# Patient Record
Sex: Female | Born: 1960 | Race: Black or African American | Hispanic: No | Marital: Married | Smoking: Never smoker
Health system: Southern US, Community
[De-identification: ages and names within clinical notes are randomized; demographics above are authoritative.]

## PROBLEM LIST (undated history)

## (undated) ENCOUNTER — Emergency Department (HOSPITAL_BASED_OUTPATIENT_CLINIC_OR_DEPARTMENT_OTHER): Admission: EM | Discharge status: Absent without leave | Payer: 59

## (undated) DIAGNOSIS — I10 Essential (primary) hypertension: Secondary | ICD-10-CM

## (undated) DIAGNOSIS — Z972 Presence of dental prosthetic device (complete) (partial): Secondary | ICD-10-CM

## (undated) DIAGNOSIS — K219 Gastro-esophageal reflux disease without esophagitis: Secondary | ICD-10-CM

## (undated) DIAGNOSIS — Z87898 Personal history of other specified conditions: Secondary | ICD-10-CM

## (undated) DIAGNOSIS — Z923 Personal history of irradiation: Secondary | ICD-10-CM

## (undated) DIAGNOSIS — T8859XA Other complications of anesthesia, initial encounter: Secondary | ICD-10-CM

## (undated) DIAGNOSIS — T4145XA Adverse effect of unspecified anesthetic, initial encounter: Secondary | ICD-10-CM

## (undated) DIAGNOSIS — M1990 Unspecified osteoarthritis, unspecified site: Secondary | ICD-10-CM

## (undated) DIAGNOSIS — F410 Panic disorder [episodic paroxysmal anxiety] without agoraphobia: Secondary | ICD-10-CM

## (undated) DIAGNOSIS — I341 Nonrheumatic mitral (valve) prolapse: Secondary | ICD-10-CM

## (undated) DIAGNOSIS — C50911 Malignant neoplasm of unspecified site of right female breast: Secondary | ICD-10-CM

## (undated) DIAGNOSIS — C50919 Malignant neoplasm of unspecified site of unspecified female breast: Secondary | ICD-10-CM

## (undated) HISTORY — PX: TUBAL LIGATION: SHX77

## (undated) HISTORY — PX: REDUCTION MAMMAPLASTY: SUR839

## (undated) HISTORY — DX: Gastro-esophageal reflux disease without esophagitis: K21.9

## (undated) HISTORY — DX: Essential (primary) hypertension: I10

---

## 1998-08-03 ENCOUNTER — Ambulatory Visit (HOSPITAL_COMMUNITY): Admission: RE | Admit: 1998-08-03 | Discharge: 1998-08-03 | Payer: Self-pay | Admitting: Gastroenterology

## 2008-05-30 ENCOUNTER — Ambulatory Visit (HOSPITAL_COMMUNITY): Admission: RE | Admit: 2008-05-30 | Discharge: 2008-05-30 | Payer: Self-pay | Admitting: Obstetrics and Gynecology

## 2008-05-30 ENCOUNTER — Encounter (INDEPENDENT_AMBULATORY_CARE_PROVIDER_SITE_OTHER): Payer: Self-pay | Admitting: Obstetrics and Gynecology

## 2008-05-30 HISTORY — PX: DILATION AND CURETTAGE OF UTERUS: SHX78

## 2008-05-30 HISTORY — PX: ENDOMETRIAL ABLATION: SHX621

## 2010-10-26 NOTE — Op Note (Signed)
NAME:  Felicia Bauer, Felicia Bauer NO.:  [id number]   MEDICAL RECORD NO.:  [id number]          PATIENT TYPE:  AMB   LOCATION:  SDC                           FACILITY:  [facility]   PHYSICIAN:  Miguel Aschoff, M.D.       DATE OF BIRTH:  1961-01-15   DATE OF PROCEDURE:  05/30/2008  DATE OF DISCHARGE:                               OPERATIVE REPORT   PREOPERATIVE DIAGNOSES:  Menorrhagia, uterine fibroids.   POSTOPERATIVE DIAGNOSES:  Menorrhagia, uterine fibroids.   PROCEDURES:  Cervical dilatation, hysteroscopy, uterine curettage  followed by ThermaChoice endometrial ablation.   SURGEON:  Miguel Aschoff, MD   ANESTHESIA:  General.   COMPLICATIONS:  None.   JUSTIFICATION:  The patient is a 50 year old black female with history  of progressively heavy menses with menorrhagia.  The patient desires the  procedure to control these heavy cycles and was offered Mirena IUD,  endometrial ablation, and possible hysterectomy.  The risks and benefits  of one of these options were discussed with the patient and she presents  now to undergo endometrial ablation.  Informed consent has been  obtained.   PROCEDURE:  The patient was taken to the operating room, placed in the  supine position.  General anesthesia was administered without  difficulty.  She was then placed in dorsal lithotomy position, prepped  and draped in the usual sterile fashion.  Once this was done, the  bladder was catheterized, then examination under anesthesia revealed  normal external genitalia, normal Bartholin and Skene's glands, normal  urethra.  The vaginal vault was without gross lesion.  The cervix was  without gross lesion.  Uterus was noted to be irregular in shape,  approximately 10 weeks' equivalent size.  No adnexal masses were noted.  The _Graves_________ speculum was placed in the vaginal vault.  The  anterior cervical lip was grasped with the tenaculum and then the  endocervical canal was dilated using serial Pratt  dilators until a #23  Pratt dilator could be passed.  Once this was done, the diagnostic  hysteroscope was advanced through the endocervical canal.  No  endocervical lesions were noted.  The endometrial cavity was visualized  and appeared to be normal.  No polyps or submucous myomas were noted.  The cavity did sound to 11 cm.  After this was done, sharp vigorous  curettage was carried out.  The tissue obtained was then sent for  histologic study.  At this point, the ThermaChoice endometrial ablation  balloon was introduced to the uterine cavity and a treatment cycle at 88  degrees for 8 minutes was carried out without difficulty.  On the  completion of the treatment cycle, the ThermaChoice unit was removed  intact.  Hemostasis was brought under control.  There was one area of  bleeding secondary to tenaculum grasping the cervix, and this was  brought it under control by a single figure-of-eight suture of 2-0  Vicryl.  Estimated blood loss from the procedure was approximately 30  cc.  The patient tolerated the procedure well and went to the recovery  room in satisfactory condition.  Plan is for  the patient to be  discharged  home.  Medications for home include doxycycline 100 mg twice a day x3  days, Darvocet-N 100 one every 4 hours as needed for pain.  She was  instructed to place nothing per vagina for 2 weeks, to call for any  problems such as fever, pain, or heavy bleeding.  She will be seen back  in 4 weeks for her followup examination.      Miguel Aschoff, M.D.  Electronically Signed     AR/MEDQ  D:  05/30/2008  T:  05/31/2008  Job:  [id number]

## 2011-03-18 LAB — CBC
Hemoglobin: 13.2 g/dL (ref 12.0–15.0)
MCHC: 33.6 g/dL (ref 30.0–36.0)
RBC: 4.63 MIL/uL (ref 3.87–5.11)

## 2011-03-18 LAB — PROTIME-INR
INR: 0.9 (ref 0.00–1.49)
Prothrombin Time: 12.4 seconds (ref 11.6–15.2)

## 2011-03-18 LAB — BASIC METABOLIC PANEL
Calcium: 10 mg/dL (ref 8.4–10.5)
Creatinine, Ser: 0.67 mg/dL (ref 0.4–1.2)
GFR calc Af Amer: >60 mL/min (ref >60)
GFR calc non Af Amer: >60 mL/min (ref >60)
Sodium: 134 mEq/L — ABNORMAL LOW (ref 135–145)

## 2011-03-18 LAB — APTT: aPTT: 38 seconds — ABNORMAL HIGH (ref 24–37)

## 2011-11-10 ENCOUNTER — Other Ambulatory Visit: Payer: Self-pay | Admitting: Obstetrics and Gynecology

## 2012-11-16 ENCOUNTER — Other Ambulatory Visit: Payer: Self-pay | Admitting: Obstetrics and Gynecology

## 2013-03-22 ENCOUNTER — Encounter: Payer: Self-pay | Admitting: Neurology

## 2013-03-22 ENCOUNTER — Ambulatory Visit (INDEPENDENT_AMBULATORY_CARE_PROVIDER_SITE_OTHER): Payer: BC Managed Care – PPO | Admitting: Neurology

## 2013-03-22 ENCOUNTER — Encounter (INDEPENDENT_AMBULATORY_CARE_PROVIDER_SITE_OTHER): Payer: Self-pay

## 2013-03-22 VITALS — BP 115/72 | HR 57 | Temp 98.4°F | Ht 62.0 in | Wt 176.0 lb

## 2013-03-22 DIAGNOSIS — R202 Paresthesia of skin: Secondary | ICD-10-CM | POA: Insufficient documentation

## 2013-03-22 DIAGNOSIS — R209 Unspecified disturbances of skin sensation: Secondary | ICD-10-CM

## 2013-03-22 DIAGNOSIS — R29898 Other symptoms and signs involving the musculoskeletal system: Secondary | ICD-10-CM

## 2013-03-22 DIAGNOSIS — R6889 Other general symptoms and signs: Secondary | ICD-10-CM

## 2013-03-22 DIAGNOSIS — R200 Anesthesia of skin: Secondary | ICD-10-CM

## 2013-03-22 DIAGNOSIS — D509 Iron deficiency anemia, unspecified: Secondary | ICD-10-CM

## 2013-03-22 DIAGNOSIS — R29818 Other symptoms and signs involving the nervous system: Secondary | ICD-10-CM

## 2013-03-22 DIAGNOSIS — M6281 Muscle weakness (generalized): Secondary | ICD-10-CM

## 2013-03-22 NOTE — Patient Instructions (Addendum)
There is no specific treatment for most neuropathies. The most common cause for neuropathy is diabetes in this country, in which case, tight glucose control is key. Certain medications such as chemotherapy agents and other chemicals or toxins including alcohol can cause neuropathy. There are some genetic conditions or hereditary neuropathies. Typically patients will report a family history of neuropathy in those conditions. Most neuropathies are progressive unless they are caused by a toxin or heavy metal which has been removed or treated. For most neuropathies there is no cure. Painful neuropathy can be difficult to treat symptomatically. Electrophysiologic testing with nerve conduction velocity studies and EMG do not always pick up neuropathies that affect the smallest fibers.  I think overall you are doing fairly well but I do want to suggest a few things today:  Remember to drink plenty of fluid, eat healthy meals and do not skip any meals. Try to eat protein with a every meal and eat a healthy snack such as fruit or nuts in between meals. Try to keep a regular sleep-wake schedule and try to exercise daily, particularly in the form of walking, 20-30 minutes a day, if you can.   As far as your medications are concerned, I would like to suggest that you discuss increasing your gabapentin to 300 mg three times a day with your PCP.  As far as diagnostic testing: EMG and NCV testing, blood test, MRI neck.  I would like to see you back in 3 months, sooner if we need to. Please call us with any interim questions, concerns, problems, updates or refill requests.  Please also call us for any test results so we can go over those with you on the phone. Brett Canales is my clinical assistant and will answer any of your questions and relay your messages to me and also relay most of my messages to you.  Our phone number is [phone]. We also have an after hours call service for urgent matters and there is a physician  on-call for urgent questions. For any emergencies you know to call 911 or go to the nearest emergency room.

## 2013-03-22 NOTE — Progress Notes (Signed)
Subjective:    Patient ID: Felicia Bauer is a 52 y.o. female.  HPI  Huston Foley, MD, PhD [facility] [address] [address] [place], [place] [address]  Dear Dr. [place],  I saw your patient, Felicia Bauer, upon your kind request in my neurologic clinic today for initial consultation of her numbness and tingling. The patient is unaccompanied today. As you know, Felicia Bauer is a very pleasant 52 year old right-handed woman with an underlying medical history of hypertension, reflux disease, allergic rhinitis, anemia, and panic disorder, who has been experiencing numbness and tingling in her upper and lower extremities for the past months. She describes burning pain as well, no clear back pain or neck pain, but Sx are worse with bearing down on the toilet and when bending down. She has been tried on gabapentin without much in the way of success. She had lab work done in your office which showed normal vitamin B12 which was on the lower end of the spectrum at 289, normal TSH, normal SPEP. This was done on 03/01/2013 and I reviewed those test results. She started having Depo Lupron injections in 6/14 and has received 3 injections thus far. She is due for her 4th injection next week. She has had onset of Symptoms over 1 month ago, she is not diabetic, has no FHx of neuropathy, has not changed her meds or supplements with the exception of starting B12 last month. She has had no prodromal illness, is a non-smoker and non-drinker. She has had no exposure to chemicals or herbicides or pesticides. She is taking gabapentin 300 mg, which has helped.   Her Past Medical History Is Significant For: Past Medical History  Diagnosis Date  . Essential hypertension, benign   . Chest pain, unspecified   . Shortness of breath   . Insomnia, unspecified   . Iron deficiency anemia secondary to blood loss (chronic)   . Mitral valve disorders   . Obesity, unspecified   . Sleep  disturbance, unspecified   . Panic disorder   . GERD (gastroesophageal reflux disease)   . Disturbance of skin sensation     Her Past Surgical History Is Significant For: History reviewed. No pertinent past surgical history.  Her Family History Is Significant For: History reviewed. No pertinent family history.  Her Social History Is Significant For: History   Social History  . Marital Status: Legally Separated    Spouse Name: N/A    Number of Children: N/A  . Years of Education: N/A   Social History Main Topics  . Smoking status: Never Smoker   . Smokeless tobacco: None  . Alcohol Use: No  . Drug Use: No  . Sexual Activity: None   Other Topics Concern  . None   Social History Narrative  . None    Her Allergies Are:  No Known Allergies:   Her Current Medications Are:  No outpatient encounter prescriptions on file as of 03/22/2013.   No facility-administered encounter medications on file as of 03/22/2013.   Review of Systems:  Out of a complete 14 point review of systems, all are reviewed and negative with the exception of these symptoms as listed below:   Review of Systems  Neurological: Positive for weakness and numbness.    Objective:  Neurologic Exam  Physical Exam Physical Examination:   Filed Vitals:   03/22/13 0959  BP: 115/72  Pulse: 57  Temp: 98.4 F (36.9 C)    General Examination: The patient is  a very pleasant 52 y.o. female in no acute distress. She appears well-developed and well-nourished and well groomed.   HEENT: Normocephalic, atraumatic, pupils are equal, round and reactive to light and accommodation. Funduscopic exam is normal with sharp disc margins noted. Extraocular tracking is good without limitation to gaze excursion or nystagmus noted. Normal smooth pursuit is noted. Hearing is grossly intact. Tympanic membranes are clear bilaterally. Face is symmetric with normal facial animation and normal facial sensation. Speech is clear  with no dysarthria noted. There is no hypophonia. There is no lip, neck/head, jaw or voice tremor. Neck is supple with full range of passive and active motion. There are no carotid bruits on auscultation. Oropharynx exam reveals: mild mouth dryness, adequate dental hygiene and mild airway crowding. Mallampati is class I. Tongue protrudes centrally and palate elevates symmetrically. Tonsils are 2+ in size.   Chest: Clear to auscultation without wheezing, rhonchi or crackles noted.  Heart: S1+S2+0, regular and normal without murmurs, rubs or gallops noted.   Abdomen: Soft, non-tender and non-distended with normal bowel sounds appreciated on auscultation.  Extremities: There is no pitting edema in the distal lower extremities bilaterally. Pedal pulses are intact.  Skin: Warm and dry without trophic changes noted. There are no varicose veins.  Musculoskeletal: exam reveals no obvious joint deformities, tenderness or joint swelling or erythema.   Neurologically:  Mental status: The patient is awake, alert and oriented in all 4 spheres. Her memory, attention, language and knowledge are appropriate. There is no aphasia, agnosia, apraxia or anomia. Speech is clear with normal prosody and enunciation. Thought process is linear. Mood is congruent and affect is normal.  Cranial nerves are as described above under HEENT exam. In addition, shoulder shrug is normal with equal shoulder height noted. Motor exam: Normal bulk, strength and tone is noted with the exception of mild grip strength weakness in the L hand. There is no drift, tremor or rebound. Romberg is negative. Reflexes are 2+ throughout. Toes are downgoing bilaterally. Fine motor skills are intact with normal finger taps, normal hand movements, normal rapid alternating patting, normal foot taps and normal foot agility.  Cerebellar testing shows no dysmetria or intention tremor on finger to nose testing. Heel to shin is unremarkable bilaterally. There  is no truncal or gait ataxia.  Sensory exam is intact to light touch, pinprick, vibration, temperature sense in the upper and lower extremities, but she reports tingling and burning pain in both hands and feet.  Gait, station and balance are unremarkable. No veering to one side is noted. No leaning to one side is noted. Posture is age-appropriate and stance is narrow based. No problems turning are noted. She turns en bloc. Tandem walk is unremarkable. Intact toe and heel stance is noted.               Assessment and Plan:   In summary, MAEBEL MARASCO is a very pleasant 52 y.o.-year old female with a history of paresthesias and numbness in the past 1-2 months. Her physical exam is c/w paresthesias and otherwise non-focal and she describes a phenomenon similar to Lhermitte's. She has no clear numbness on exam and mild L grip weakness. She had some improvement with low dose Gabapentin. You can increase this gradually to 300 mg tid at this time.  She has had anemia from menorrhagia. She has been on Lupron injections and I cannot exclude, that her Sx are from this medication and paresthesias and weakness can be rare SEs.  I had  a long chat with the patient and her mother about my findings and her Sx, the prognosis and treatment options. We talked about medical treatments and non-pharmacological approaches. I suggested further w/u in the form of EMG/NCV, C spine MRI, and blood work.  I answered all their questions today and the patient and her mother were in agreement with the above outlined plan. I would like to see the patient back in 3 months, sooner if the need arises and encouraged them to call with any interim questions, concerns, problems or updates and test results.   Thank you very much for allowing me to participate in the care of this nice patient. If I can be of any further assistance to you please do not hesitate to call me at [phone].  Sincerely,   Huston Foley, MD, PhD

## 2013-03-26 LAB — VITAMIN B6: Vitamin B6: 9.1 ug/L (ref 2.0–32.8)

## 2013-03-26 LAB — HEAVY METALS PROFILE II, BLOOD
Arsenic: 5 ug/L (ref 2–23)
Cadmium: NOT DETECTED ug/L (ref 0.0–1.2)
Mercury: 1.2 ug/L (ref 0.0–14.9)

## 2013-03-26 LAB — CK TOTAL AND CKMB (NOT AT ARMC)
CK-MB Index: 1.6 ng/mL (ref 0.0–2.9)
Total CK: 95 U/L (ref 24–173)

## 2013-03-26 LAB — RPR: RPR: NONREACTIVE

## 2013-03-26 NOTE — Progress Notes (Signed)
Quick Note:  Please call and advise the patient that the recent labs we checked were within normal limits. We checked heavy metals, muscle enzymes, vitamin B6, inflammatory, autoimmune and infectious markers, all of which were reported as normal. No further action is required on these tests at this time. Please remind patient to keep any upcoming appointments or tests and to call us with any interim questions, concerns, problems or updates. Thanks,  Huston Foley, MD, PhD    ______

## 2013-03-27 ENCOUNTER — Telehealth: Payer: Self-pay | Admitting: Neurology

## 2013-03-28 NOTE — Telephone Encounter (Signed)
Called patient and shared  Dr Teofilo Pod findings as noted below. Please call and advise the patient that the recent labs we checked were within normal limits. We checked heavy metals, muscle enzymes, vitamin B6, inflammatory, autoimmune and infectious markers, all of which were reported as normal. No further action is required on these tests at this time. Please remind patient to keep any upcoming appointments or tests and to call us with any interim questions, concerns, problems or updates. Thanks,  Huston Foley, MD, PhD

## 2013-04-09 ENCOUNTER — Ambulatory Visit (INDEPENDENT_AMBULATORY_CARE_PROVIDER_SITE_OTHER): Payer: BC Managed Care – PPO | Admitting: Neurology

## 2013-04-09 ENCOUNTER — Encounter (INDEPENDENT_AMBULATORY_CARE_PROVIDER_SITE_OTHER): Payer: BC Managed Care – PPO | Admitting: Radiology

## 2013-04-09 DIAGNOSIS — D509 Iron deficiency anemia, unspecified: Secondary | ICD-10-CM

## 2013-04-09 DIAGNOSIS — Z0289 Encounter for other administrative examinations: Secondary | ICD-10-CM

## 2013-04-09 DIAGNOSIS — R29898 Other symptoms and signs involving the musculoskeletal system: Secondary | ICD-10-CM

## 2013-04-09 DIAGNOSIS — R200 Anesthesia of skin: Secondary | ICD-10-CM

## 2013-04-09 DIAGNOSIS — R202 Paresthesia of skin: Secondary | ICD-10-CM

## 2013-04-09 DIAGNOSIS — R209 Unspecified disturbances of skin sensation: Secondary | ICD-10-CM

## 2013-04-09 DIAGNOSIS — R29818 Other symptoms and signs involving the nervous system: Secondary | ICD-10-CM

## 2013-04-09 NOTE — Progress Notes (Signed)
Quick Note:  Shared normal NCV EMG findings per Dr Frances Furbish notes below, lt VM message. ______

## 2013-04-09 NOTE — Procedures (Signed)
    [facility]  NCS (NERVE CONDUCTION STUDY) WITH EMG (ELECTROMYOGRAPHY) REPORT   STUDY DATE: 04/09/2013 PATIENT NAME: Felicia Bauer DOB: 04/07/1961 MRN: [id number]    TECHNOLOGIST: Kaylyn Lim ELECTROMYOGRAPHER: Levert Feinstein M.D.  CLINICAL INFORMATION: 52 years old right-handed [place] American female, presenting with two-month history of left-sided numbness, involving left arm, left leg, she denies significant weakness.  FINDINGS: NERVE CONDUCTION STUDY: Left median, ulnar sensory, mixed, and motor responses were normal. Left peroneal to EDB, and tibial motor responses were normal  NEEDLE ELECTROMYOGRAPHY: Selected needle examination was performed at left upper, left lower extremity muscles, left cervical, left lumbar paraspinal muscles.  Needle examination of left biceps, triceps, pronator teres, extensor digitorum communis, deltoid was normal.  There was no spontaneous activity at left cervical paraspinal muscles, left C5, C6, C7  Needle examination of left tibialis anterior, tibialis posterior, medial gastrocnemius, vastus lateralis was normal.  There was no spontaneous activity at left L4, L5, S1.   IMPRESSION:  This is a normal study. There is no electrodiagnostic evidence of large fiber neuropathy, myopathy, left cervical radiculopathy, or left lumbar radiculopathy.   INTERPRETING PHYSICIAN:   Levert Feinstein M.D. Ph.D. [facility] [address] [place], [place] [address] [phone]

## 2013-04-09 NOTE — Progress Notes (Signed)
Quick Note:  Please call and advise the patient that the recent EMG and nerve conduction velocity test, which is the electrical nerve and muscle test we we performed, was reported as within normal limits. We checked for abnormal electrical discharges in the muscles or nerves and the report suggested normal findings. No further action is required on this test at this time. Please remind patient to keep any upcoming appointments or tests and to call us with any interim questions, concerns, problems or updates. Thanks,  Rajanee Schuelke, MD, PhD   ______ 

## 2013-04-10 ENCOUNTER — Ambulatory Visit (INDEPENDENT_AMBULATORY_CARE_PROVIDER_SITE_OTHER): Payer: BC Managed Care – PPO

## 2013-04-10 DIAGNOSIS — R209 Unspecified disturbances of skin sensation: Secondary | ICD-10-CM

## 2013-04-10 DIAGNOSIS — R29818 Other symptoms and signs involving the nervous system: Secondary | ICD-10-CM

## 2013-04-10 DIAGNOSIS — R6889 Other general symptoms and signs: Secondary | ICD-10-CM

## 2013-04-10 DIAGNOSIS — M6281 Muscle weakness (generalized): Secondary | ICD-10-CM

## 2013-04-10 DIAGNOSIS — R29898 Other symptoms and signs involving the musculoskeletal system: Secondary | ICD-10-CM

## 2013-04-10 DIAGNOSIS — R200 Anesthesia of skin: Secondary | ICD-10-CM

## 2013-04-10 DIAGNOSIS — D509 Iron deficiency anemia, unspecified: Secondary | ICD-10-CM

## 2013-04-11 ENCOUNTER — Telehealth: Payer: Self-pay | Admitting: Neurology

## 2013-04-11 DIAGNOSIS — M5030 Other cervical disc degeneration, unspecified cervical region: Secondary | ICD-10-CM

## 2013-04-11 DIAGNOSIS — R29818 Other symptoms and signs involving the nervous system: Secondary | ICD-10-CM

## 2013-04-11 DIAGNOSIS — M4802 Spinal stenosis, cervical region: Secondary | ICD-10-CM

## 2013-04-11 DIAGNOSIS — G9520 Unspecified cord compression: Secondary | ICD-10-CM

## 2013-04-11 NOTE — Telephone Encounter (Signed)
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 I told her about the most sinister finding at C5-6 but also above and below that level. I suggested that she seek consultation with a neurosurgeon. She is reluctant to pursue this and would like to get an opinion from her primary care physician regarding where to go for this. Nevertheless, after our conversation she agreed to be referred to neurosurgery and she may call back with a particular group or Dr. she might want to see based on what her PCP suggests.

## 2013-04-18 ENCOUNTER — Other Ambulatory Visit: Payer: Self-pay

## 2013-04-19 ENCOUNTER — Other Ambulatory Visit: Payer: Self-pay | Admitting: Neurosurgery

## 2013-04-26 ENCOUNTER — Encounter: Payer: Self-pay | Admitting: Neurology

## 2013-05-15 ENCOUNTER — Other Ambulatory Visit (HOSPITAL_COMMUNITY): Payer: Self-pay | Admitting: *Deleted

## 2013-05-15 NOTE — Pre-Procedure Instructions (Signed)
SHERALD BALBUENA  05/15/2013   Your procedure is scheduled on:  Friday, May 24, 2013 at 7:30 AM.   Report to [facility] Entrance "A" at 5:30 AM.   Call this number if you have problems the morning of surgery: [phone]   Remember:   Do not eat food or drink liquids after midnight Thursday, 05/23/13.   Take these medicines the morning of surgery with A SIP OF WATER: Dexlansoprazole (DEXILANT PO), nadolol (CORGARD), clonazePAM (KLONOPIN) - if needed, and Norethindrone.  Stop all Vitamins and Aspirin as of Friday, 05/17/13.                Do not wear jewelry, make-up or nail polish.  Do not wear lotions, powders, or perfumes. You may wear deodorant.  Do not shave 48 hours prior to surgery.  Do not bring valuables to the hospital.  [facility] is not responsible  for any belongings or valuables.               Contacts, dentures or bridgework may not be worn into surgery.  Leave suitcase in the car. After surgery it may be brought to your room.  For patients admitted to the hospital, discharge time is determined by your  treatment team.            Special Instructions: Shower using CHG 2 nights before surgery and the night before surgery.  If you shower the day of surgery use CHG.  Use special wash - you have one bottle of CHG for all showers.  You should use approximately 1/3 of the bottle for each shower.   Please read over the following fact sheets that you were given: Pain Booklet, Coughing and Deep Breathing, MRSA Information and Surgical Site Infection Prevention

## 2013-05-16 ENCOUNTER — Encounter (HOSPITAL_COMMUNITY)
Admission: RE | Admit: 2013-05-16 | Discharge: 2013-05-16 | Disposition: A | Discharge status: Home / Self Care | Payer: BC Managed Care – PPO | Source: Ambulatory Visit | Attending: Neurosurgery | Admitting: Neurosurgery

## 2013-05-16 ENCOUNTER — Encounter (HOSPITAL_COMMUNITY): Payer: Self-pay

## 2013-05-16 DIAGNOSIS — Z01810 Encounter for preprocedural cardiovascular examination: Secondary | ICD-10-CM | POA: Insufficient documentation

## 2013-05-16 DIAGNOSIS — Z01818 Encounter for other preprocedural examination: Secondary | ICD-10-CM | POA: Insufficient documentation

## 2013-05-16 DIAGNOSIS — Z01812 Encounter for preprocedural laboratory examination: Secondary | ICD-10-CM | POA: Insufficient documentation

## 2013-05-16 LAB — BASIC METABOLIC PANEL
BUN: 14 mg/dL (ref 6–23)
CO2: 29 mEq/L (ref 19–32)
Calcium: 9.4 mg/dL (ref 8.4–10.5)
Chloride: 98 mEq/L (ref 96–112)
Potassium: 3.9 mEq/L (ref 3.5–5.1)
Sodium: 134 mEq/L — ABNORMAL LOW (ref 135–145)

## 2013-05-16 LAB — CBC
HCT: 32.4 % — ABNORMAL LOW (ref 36.0–46.0)
MCH: 20.6 pg — ABNORMAL LOW (ref 26.0–34.0)
MCHC: 30.6 g/dL (ref 30.0–36.0)
MCV: 67.4 fL — ABNORMAL LOW (ref 78.0–100.0)
Platelets: 263 10*3/uL (ref 150–400)
RBC: 4.81 MIL/uL (ref 3.87–5.11)
WBC: 7.2 10*3/uL (ref 4.0–10.5)

## 2013-05-17 NOTE — Progress Notes (Addendum)
Anesthesia Chart Review:  Patient is a 52 year old female scheduled for C4-5, C5-6, C6-7 ACDF on 05/24/13 by Dr. Gerlene Fee.  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  She has a history of cardiology evaluation for chest pain by Dr. Donato Schultz. PCP is listed as Dr. Marinda Elk.    EKG on 05/16/13 showed SB @ 54 bpm.  CXR on 05/16/13 showed no active cardiopulmonary disease.  Preoperative labs noted.  Cr 0.70, glucose 107.  H/H 9.9/32.4.    Cardiology records from Dr. Anne Fu requested as well as last office note with labs from Dr. Foy Guadalajara.  I'll follow-up once records received.  Velna Ochs [facility] Stay [facility]/Anesthesiology Phone [phone] 05/17/2013 5:06 PM  Addendum: 05/20/2013 1:30 PM Reviewed her last cardiology records with Dr. Anne Fu.  He last saw her in 04/2012 and recommended only PRN cardiology follow-up.  He did not recommend need for dental prophylaxis.  Echo on 09/11/2008 showed normal LV size and function, LVEF 55-60%, mild anterior MVP, trace MR, trivial TR, trace PR, mild diastolic dysfunction.  Nuclear stress test on 09/11/08 showed normal pattern of perfusion in all regions, no evidence of inducible ischemia, no regional wall motion abnormalities, post-stress EF 74%, low risk scan.  CBC labs trends from 11/24/10 - 01/03/13 reviewed (Dr. Foy Gu[place]ara). Her H/H were 8.3/26.9 on 01/03/13.  It appears her baseline hgb is ~ 10 since 11/2010, so overall, her current CBC results appear stable.  Since her hgb is < 10, I will order a T&S.

## 2013-05-20 ENCOUNTER — Encounter (HOSPITAL_COMMUNITY): Payer: Self-pay

## 2013-05-23 MED ORDER — DEXAMETHASONE SODIUM PHOSPHATE 10 MG/ML IJ SOLN
10.0000 mg | INTRAMUSCULAR | Status: AC
  Administered 2013-05-24: 10 mg via INTRAVENOUS
  Filled 2013-05-23: qty 1

## 2013-05-23 MED ORDER — CEFAZOLIN SODIUM-DEXTROSE 2-3 GM-% IV SOLR
2.0000 g | INTRAVENOUS | Status: AC
  Administered 2013-05-24: 2 g via INTRAVENOUS
  Filled 2013-05-23: qty 50

## 2013-05-24 ENCOUNTER — Encounter (HOSPITAL_COMMUNITY): Payer: BC Managed Care – PPO | Admitting: Vascular Surgery

## 2013-05-24 ENCOUNTER — Inpatient Hospital Stay (HOSPITAL_COMMUNITY)
Admission: RE | Admit: 2013-05-24 | Discharge: 2013-05-27 | DRG: 473 | Disposition: A | Discharge status: Home / Self Care | Payer: BC Managed Care – PPO | Source: Ambulatory Visit | Attending: Neurosurgery | Admitting: Neurosurgery

## 2013-05-24 ENCOUNTER — Ambulatory Visit (HOSPITAL_COMMUNITY): Payer: BC Managed Care – PPO | Admitting: Certified Registered Nurse Anesthetist

## 2013-05-24 ENCOUNTER — Encounter (HOSPITAL_COMMUNITY)
Admission: RE | Disposition: A | Discharge status: Home / Self Care | Payer: Self-pay | Source: Ambulatory Visit | Attending: Neurosurgery

## 2013-05-24 ENCOUNTER — Encounter (HOSPITAL_COMMUNITY): Payer: Self-pay | Admitting: Certified Registered Nurse Anesthetist

## 2013-05-24 ENCOUNTER — Ambulatory Visit (HOSPITAL_COMMUNITY): Payer: BC Managed Care – PPO

## 2013-05-24 DIAGNOSIS — I10 Essential (primary) hypertension: Secondary | ICD-10-CM | POA: Diagnosis present

## 2013-05-24 DIAGNOSIS — I059 Rheumatic mitral valve disease, unspecified: Secondary | ICD-10-CM | POA: Diagnosis present

## 2013-05-24 DIAGNOSIS — Z79899 Other long term (current) drug therapy: Secondary | ICD-10-CM

## 2013-05-24 DIAGNOSIS — R209 Unspecified disturbances of skin sensation: Secondary | ICD-10-CM | POA: Diagnosis present

## 2013-05-24 DIAGNOSIS — M5000 Cervical disc disorder with myelopathy, unspecified cervical region: Principal | ICD-10-CM | POA: Diagnosis present

## 2013-05-24 DIAGNOSIS — K219 Gastro-esophageal reflux disease without esophagitis: Secondary | ICD-10-CM | POA: Diagnosis present

## 2013-05-24 DIAGNOSIS — G959 Disease of spinal cord, unspecified: Secondary | ICD-10-CM | POA: Diagnosis present

## 2013-05-24 DIAGNOSIS — Z7982 Long term (current) use of aspirin: Secondary | ICD-10-CM

## 2013-05-24 HISTORY — PX: ANTERIOR CERVICAL DECOMP/DISCECTOMY FUSION: SHX1161

## 2013-05-24 LAB — TYPE AND SCREEN
ABO/RH(D): O POS
Antibody Screen: NEGATIVE

## 2013-05-24 SURGERY — ANTERIOR CERVICAL DECOMPRESSION/DISCECTOMY FUSION 3 LEVELS
Anesthesia: General

## 2013-05-24 MED ORDER — HYDROCHLOROTHIAZIDE 25 MG PO TABS
25.0000 mg | ORAL_TABLET | Freq: Every day | ORAL | Status: DC
  Administered 2013-05-25 – 2013-05-27 (×3): 25 mg via ORAL
  Filled 2013-05-24 (×4): qty 1

## 2013-05-24 MED ORDER — HYDROMORPHONE HCL PF 1 MG/ML IJ SOLN
0.2500 mg | INTRAMUSCULAR | Status: DC | PRN
  Administered 2013-05-24 (×4): 0.5 mg via INTRAVENOUS

## 2013-05-24 MED ORDER — SODIUM CHLORIDE 0.9 % IJ SOLN
3.0000 mL | Freq: Two times a day (BID) | INTRAMUSCULAR | Status: DC
  Administered 2013-05-24 – 2013-05-27 (×6): 3 mL via INTRAVENOUS

## 2013-05-24 MED ORDER — HYDROMORPHONE HCL PF 1 MG/ML IJ SOLN
1.0000 mg | INTRAMUSCULAR | Status: DC | PRN
  Administered 2013-05-25 – 2013-05-27 (×7): 1 mg via INTRAMUSCULAR
  Filled 2013-05-24 (×7): qty 1

## 2013-05-24 MED ORDER — SODIUM CHLORIDE 0.9 % IJ SOLN: 3.0000 mL | INTRAMUSCULAR | Status: DC | PRN

## 2013-05-24 MED ORDER — GABAPENTIN 600 MG PO TABS
600.0000 mg | ORAL_TABLET | Freq: Three times a day (TID) | ORAL | Status: DC
  Administered 2013-05-24 – 2013-05-27 (×9): 600 mg via ORAL
  Filled 2013-05-24 (×12): qty 1

## 2013-05-24 MED ORDER — SODIUM CHLORIDE 0.9 % IV SOLN
10.0000 mg | INTRAVENOUS | Status: DC | PRN
  Administered 2013-05-24: 10 ug/min via INTRAVENOUS

## 2013-05-24 MED ORDER — SODIUM CHLORIDE 0.9 % IV SOLN: 250.0000 mL | INTRAVENOUS | Status: DC

## 2013-05-24 MED ORDER — DEXAMETHASONE SODIUM PHOSPHATE 10 MG/ML IJ SOLN
10.0000 mg | Freq: Once | INTRAMUSCULAR | Status: AC
  Administered 2013-05-24: 10 mg via INTRAVENOUS
  Filled 2013-05-24 (×2): qty 1

## 2013-05-24 MED ORDER — 0.9 % SODIUM CHLORIDE (POUR BTL) OPTIME
TOPICAL | Status: DC | PRN
  Administered 2013-05-24: 1000 mL

## 2013-05-24 MED ORDER — HYDROMORPHONE HCL PF 1 MG/ML IJ SOLN
INTRAMUSCULAR | Status: AC
  Filled 2013-05-24: qty 1

## 2013-05-24 MED ORDER — CYCLOBENZAPRINE HCL 10 MG PO TABS
ORAL_TABLET | ORAL | Status: AC
  Filled 2013-05-24: qty 1

## 2013-05-24 MED ORDER — ZOLPIDEM TARTRATE 5 MG PO TABS: 5.0000 mg | ORAL_TABLET | Freq: Every evening | ORAL | Status: DC | PRN

## 2013-05-24 MED ORDER — PHENYLEPHRINE HCL 10 MG/ML IJ SOLN
INTRAMUSCULAR | Status: DC | PRN
  Administered 2013-05-24 (×4): 40 ug via INTRAVENOUS

## 2013-05-24 MED ORDER — DEXAMETHASONE 4 MG PO TABS
4.0000 mg | ORAL_TABLET | Freq: Four times a day (QID) | ORAL | Status: AC
  Administered 2013-05-24: 4 mg via ORAL
  Filled 2013-05-24: qty 1

## 2013-05-24 MED ORDER — KCL IN DEXTROSE-NACL 20-5-0.45 MEQ/L-%-% IV SOLN
80.0000 mL/h | INTRAVENOUS | Status: DC
  Filled 2013-05-24 (×7): qty 1000

## 2013-05-24 MED ORDER — PANTOPRAZOLE SODIUM 40 MG IV SOLR
40.0000 mg | Freq: Every day | INTRAVENOUS | Status: DC
  Administered 2013-05-24: 40 mg via INTRAVENOUS
  Filled 2013-05-24 (×2): qty 40

## 2013-05-24 MED ORDER — FENTANYL CITRATE 0.05 MG/ML IJ SOLN
INTRAMUSCULAR | Status: DC | PRN
  Administered 2013-05-24 (×4): 50 ug via INTRAVENOUS
  Administered 2013-05-24: 100 ug via INTRAVENOUS

## 2013-05-24 MED ORDER — LEUPROLIDE ACETATE 3.75 MG IM KIT: 3.7500 mg | PACK | INTRAMUSCULAR | Status: DC

## 2013-05-24 MED ORDER — CEFAZOLIN SODIUM-DEXTROSE 2-3 GM-% IV SOLR
2.0000 g | Freq: Three times a day (TID) | INTRAVENOUS | Status: AC
  Administered 2013-05-24 (×2): 2 g via INTRAVENOUS
  Filled 2013-05-24 (×2): qty 50

## 2013-05-24 MED ORDER — PROPOFOL 10 MG/ML IV BOLUS
INTRAVENOUS | Status: DC | PRN
  Administered 2013-05-24: 150 mg via INTRAVENOUS

## 2013-05-24 MED ORDER — LIDOCAINE HCL (CARDIAC) 20 MG/ML IV SOLN
INTRAVENOUS | Status: DC | PRN
  Administered 2013-05-24: 100 mg via INTRAVENOUS

## 2013-05-24 MED ORDER — THROMBIN 5000 UNITS EX SOLR
OROMUCOSAL | Status: DC | PRN
  Administered 2013-05-24: 10:00:00 via TOPICAL

## 2013-05-24 MED ORDER — NADOLOL 20 MG PO TABS
20.0000 mg | ORAL_TABLET | Freq: Every day | ORAL | Status: DC
  Administered 2013-05-25 – 2013-05-27 (×3): 20 mg via ORAL
  Filled 2013-05-24 (×3): qty 1

## 2013-05-24 MED ORDER — MENTHOL 3 MG MT LOZG
1.0000 | LOZENGE | OROMUCOSAL | Status: DC | PRN
  Administered 2013-05-26: 3 mg via ORAL
  Filled 2013-05-24: qty 9

## 2013-05-24 MED ORDER — ACETAMINOPHEN 325 MG PO TABS: 650.0000 mg | ORAL_TABLET | ORAL | Status: DC | PRN

## 2013-05-24 MED ORDER — GLYCOPYRROLATE 0.2 MG/ML IJ SOLN
INTRAMUSCULAR | Status: DC | PRN
  Administered 2013-05-24: .8 mg via INTRAVENOUS

## 2013-05-24 MED ORDER — LACTATED RINGERS IV SOLN
INTRAVENOUS | Status: DC | PRN
  Administered 2013-05-24 (×2): via INTRAVENOUS

## 2013-05-24 MED ORDER — ARTIFICIAL TEARS OP OINT
TOPICAL_OINTMENT | OPHTHALMIC | Status: DC | PRN
  Administered 2013-05-24: 1 via OPHTHALMIC

## 2013-05-24 MED ORDER — MIDAZOLAM HCL 5 MG/5ML IJ SOLN
INTRAMUSCULAR | Status: DC | PRN
  Administered 2013-05-24: 2 mg via INTRAVENOUS

## 2013-05-24 MED ORDER — CYCLOBENZAPRINE HCL 10 MG PO TABS
10.0000 mg | ORAL_TABLET | Freq: Three times a day (TID) | ORAL | Status: DC | PRN
  Administered 2013-05-24 – 2013-05-27 (×7): 10 mg via ORAL
  Filled 2013-05-24 (×7): qty 1

## 2013-05-24 MED ORDER — ONDANSETRON HCL 4 MG/2ML IJ SOLN
INTRAMUSCULAR | Status: DC | PRN
  Administered 2013-05-24: 4 mg via INTRAVENOUS

## 2013-05-24 MED ORDER — ROCURONIUM BROMIDE 100 MG/10ML IV SOLN
INTRAVENOUS | Status: DC | PRN
  Administered 2013-05-24: 10 mg via INTRAVENOUS
  Administered 2013-05-24: 50 mg via INTRAVENOUS
  Administered 2013-05-24 (×2): 10 mg via INTRAVENOUS

## 2013-05-24 MED ORDER — SODIUM CHLORIDE 0.9 % IR SOLN
Status: DC | PRN
  Administered 2013-05-24: 09:00:00

## 2013-05-24 MED ORDER — THROMBIN 20000 UNITS EX SOLR
CUTANEOUS | Status: DC | PRN
  Administered 2013-05-24: 09:00:00 via TOPICAL

## 2013-05-24 MED ORDER — NEOSTIGMINE METHYLSULFATE 1 MG/ML IJ SOLN
INTRAMUSCULAR | Status: DC | PRN
  Administered 2013-05-24: 5 mg via INTRAVENOUS

## 2013-05-24 MED ORDER — PHENOL 1.4 % MT LIQD
1.0000 | OROMUCOSAL | Status: DC | PRN
  Administered 2013-05-26: 1 via OROMUCOSAL
  Filled 2013-05-24: qty 177

## 2013-05-24 MED ORDER — ACETAMINOPHEN 650 MG RE SUPP: 650.0000 mg | RECTAL | Status: DC | PRN

## 2013-05-24 MED ORDER — HYDROCODONE-ACETAMINOPHEN 5-325 MG PO TABS
1.0000 | ORAL_TABLET | ORAL | Status: DC | PRN
  Administered 2013-05-24 – 2013-05-27 (×13): 2 via ORAL
  Filled 2013-05-24 (×13): qty 2

## 2013-05-24 MED ORDER — DEXAMETHASONE SODIUM PHOSPHATE 4 MG/ML IJ SOLN
4.0000 mg | Freq: Four times a day (QID) | INTRAMUSCULAR | Status: AC
  Administered 2013-05-24: 4 mg via INTRAVENOUS
  Filled 2013-05-24: qty 1

## 2013-05-24 MED ORDER — ONDANSETRON HCL 4 MG/2ML IJ SOLN: 4.0000 mg | INTRAMUSCULAR | Status: DC | PRN

## 2013-05-24 SURGICAL SUPPLY — 69 items
ADH SKN CLS LQ APL DERMABOND (GAUZE/BANDAGES/DRESSINGS) ×1
APL SKNCLS STERI-STRIP NONHPOA (GAUZE/BANDAGES/DRESSINGS) ×1
BAG DECANTER FOR FLEXI CONT (MISCELLANEOUS) ×2 IMPLANT
BENZOIN TINCTURE PRP APPL 2/3 (GAUZE/BANDAGES/DRESSINGS) ×4 IMPLANT
BIT DRILL TRINICA 2.3MM (BIT) IMPLANT
BRUSH SCRUB EZ PLAIN DRY (MISCELLANEOUS) ×2 IMPLANT
BUR MATCHSTICK NEURO 3.0 LAGG (BURR) ×2 IMPLANT
CANISTER SUCT 3000ML (MISCELLANEOUS) ×2 IMPLANT
CONT SPEC 4OZ CLIKSEAL STRL BL (MISCELLANEOUS) ×2 IMPLANT
DERMABOND ADHESIVE PROPEN (GAUZE/BANDAGES/DRESSINGS) ×1
DERMABOND ADVANCED .7 DNX6 (GAUZE/BANDAGES/DRESSINGS) IMPLANT
DRAIN SNY WOU 7FLT (WOUND CARE) ×1 IMPLANT
DRAPE C-ARM 42X72 X-RAY (DRAPES) ×4 IMPLANT
DRAPE LAPAROTOMY 100X72 PEDS (DRAPES) ×2 IMPLANT
DRAPE MICROSCOPE ZEISS OPMI (DRAPES) ×2 IMPLANT
DRAPE POUCH INSTRU U-SHP 10X18 (DRAPES) ×2 IMPLANT
DRAPE SURG 17X23 STRL (DRAPES) ×4 IMPLANT
DRESSING TELFA 8X3 (GAUZE/BANDAGES/DRESSINGS) ×2 IMPLANT
DRILL BIT TRINICA 2.3MM (BIT) ×2
DRSG OPSITE 4X5.5 SM (GAUZE/BANDAGES/DRESSINGS) ×1 IMPLANT
DURAPREP 6ML APPLICATOR 50/CS (WOUND CARE) ×2 IMPLANT
ELECT COATED BLADE 2.86 ST (ELECTRODE) ×2 IMPLANT
ELECT REM PT RETURN 9FT ADLT (ELECTROSURGICAL) ×2
ELECTRODE REM PT RTRN 9FT ADLT (ELECTROSURGICAL) ×1 IMPLANT
EVACUATOR SILICONE 100CC (DRAIN) ×1 IMPLANT
GAUZE SPONGE 4X4 16PLY XRAY LF (GAUZE/BANDAGES/DRESSINGS) IMPLANT
GLOVE BIOGEL PI IND STRL 7.0 (GLOVE) IMPLANT
GLOVE BIOGEL PI IND STRL 7.5 (GLOVE) IMPLANT
GLOVE BIOGEL PI INDICATOR 7.0 (GLOVE) ×1
GLOVE BIOGEL PI INDICATOR 7.5 (GLOVE) ×1
GLOVE ECLIPSE 8.0 STRL XLNG CF (GLOVE) ×2 IMPLANT
GLOVE ECLIPSE 8.5 STRL (GLOVE) ×1 IMPLANT
GLOVE EXAM NITRILE LRG STRL (GLOVE) IMPLANT
GLOVE EXAM NITRILE XL STR (GLOVE) IMPLANT
GLOVE EXAM NITRILE XS STR PU (GLOVE) IMPLANT
GLOVE SS BIOGEL STRL SZ 6.5 (GLOVE) IMPLANT
GLOVE SUPERSENSE BIOGEL SZ 6.5 (GLOVE) ×2
GOWN BRE IMP SLV AUR LG STRL (GOWN DISPOSABLE) ×1 IMPLANT
GOWN BRE IMP SLV AUR XL STRL (GOWN DISPOSABLE) ×1 IMPLANT
GOWN STRL REIN 2XL LVL4 (GOWN DISPOSABLE) ×2 IMPLANT
HEAD HALTER (SOFTGOODS) ×2 IMPLANT
INTERBODY TM 11X14X5-0DEG PAR (Metal Cage) ×1 IMPLANT
KIT BASIN OR (CUSTOM PROCEDURE TRAY) ×2 IMPLANT
KIT ROOM TURNOVER OR (KITS) ×2 IMPLANT
NDL SPNL 20GX3.5 QUINCKE YW (NEEDLE) ×1 IMPLANT
NEEDLE SPNL 20GX3.5 QUINCKE YW (NEEDLE) ×2 IMPLANT
NS IRRIG 1000ML POUR BTL (IV SOLUTION) ×2 IMPLANT
PACK LAMINECTOMY NEURO (CUSTOM PROCEDURE TRAY) ×2 IMPLANT
PAD ARMBOARD 7.5X6 YLW CONV (MISCELLANEOUS) ×2 IMPLANT
PATTIES SURGICAL .25X.25 (GAUZE/BANDAGES/DRESSINGS) IMPLANT
PATTIES SURGICAL .75X.75 (GAUZE/BANDAGES/DRESSINGS) ×2 IMPLANT
PLATE 51MM (Plate) ×1 IMPLANT
PUTTY BONE GRAFT KIT 2.5ML (Bone Implant) ×1 IMPLANT
RUBBERBAND STERILE (MISCELLANEOUS) ×4 IMPLANT
SCREW SD FIXED 12MM (Screw) ×8 IMPLANT
SPACER TM-S 11X14X5-7 (Spacer) ×2 IMPLANT
SPONGE GAUZE 4X4 12PLY (GAUZE/BANDAGES/DRESSINGS) ×2 IMPLANT
SPONGE INTESTINAL PEANUT (DISPOSABLE) ×2 IMPLANT
SPONGE SURGIFOAM ABS GEL 100 (HEMOSTASIS) ×2 IMPLANT
STRIP CLOSURE SKIN 1/2X4 (GAUZE/BANDAGES/DRESSINGS) ×2 IMPLANT
SUT PDS AB 5-0 P3 18 (SUTURE) ×2 IMPLANT
SUT VIC AB 3-0 CP2 18 (SUTURE) ×2 IMPLANT
SYR 20ML ECCENTRIC (SYRINGE) IMPLANT
TAPE CLOTH SURG 4X10 WHT LF (GAUZE/BANDAGES/DRESSINGS) ×1 IMPLANT
TOWEL OR 17X24 6PK STRL BLUE (TOWEL DISPOSABLE) ×2 IMPLANT
TOWEL OR 17X26 10 PK STRL BLUE (TOWEL DISPOSABLE) ×2 IMPLANT
TRAP SPECIMEN MUCOUS 40CC (MISCELLANEOUS) IMPLANT
TRAY FOLEY CATH 16FRSI W/METER (SET/KITS/TRAYS/PACK) ×2 IMPLANT
WATER STERILE IRR 1000ML POUR (IV SOLUTION) ×2 IMPLANT

## 2013-05-24 NOTE — Anesthesia Postprocedure Evaluation (Signed)
  Anesthesia Post-op Note  Patient: Felicia Bauer  Procedure(s) Performed: Procedure(s): CERVICAL FOUR-FIVE, CERVICAL FIVE-SIX, CERVICAL SIX-SEVEN ANTERIOR CERVICAL DECOMPRESSION/DISCECTOMY FUSION 3 LEVELS (N/A)  Patient Location: PACU  Anesthesia Type:General  Level of Consciousness: awake  Airway and Oxygen Therapy: Patient Spontanous Breathing  Post-op Pain: mild  Post-op Assessment: Post-op Vital signs reviewed  Post-op Vital Signs: Reviewed  Complications: No apparent anesthesia complications

## 2013-05-24 NOTE — Transfer of Care (Signed)
Immediate Anesthesia Transfer of Care Note  Patient: Felicia Bauer  Procedure(s) Performed: Procedure(s): CERVICAL FOUR-FIVE, CERVICAL FIVE-SIX, CERVICAL SIX-SEVEN ANTERIOR CERVICAL DECOMPRESSION/DISCECTOMY FUSION 3 LEVELS (N/A)  Patient Location: PACU  Anesthesia Type:General  Level of Consciousness: awake, alert  and oriented  Airway & Oxygen Therapy: Patient Spontanous Breathing and Patient connected to face mask oxygen  Post-op Assessment: Report given to PACU RN, Post -op Vital signs reviewed and stable and Patient moving all extremities X 4  Post vital signs: Reviewed and stable  Complications: No apparent anesthesia complications

## 2013-05-24 NOTE — Anesthesia Preprocedure Evaluation (Addendum)
Anesthesia Evaluation  Patient identified by MRN, date of birth, ID band Patient awake    Reviewed: Allergy & Precautions, H&P , NPO status , Patient's Chart, lab work & pertinent test results, reviewed documented beta blocker date and time   History of Anesthesia Complications Negative for: history of anesthetic complications  Airway Mallampati: II TM Distance: >3 FB Neck ROM: Full    Dental  (+) Teeth Intact and Dental Advisory Given   Pulmonary          Cardiovascular hypertension, Pt. on medications and Pt. on home beta blockers + Valvular Problems/Murmurs MVP     Neuro/Psych Anxiety L sided (UE and LE) numbness, tingling, pain, weakness    GI/Hepatic Neg liver ROS, GERD-  Medicated and Controlled,  Endo/Other  negative endocrine ROS  Renal/GU negative Renal ROS     Musculoskeletal negative musculoskeletal ROS (+)   Abdominal   Peds  Hematology  (+) anemia ,   Anesthesia Other Findings   Reproductive/Obstetrics negative OB ROS                        Anesthesia Physical Anesthesia Plan  ASA: III  Anesthesia Plan: General   Post-op Pain Management:    Induction:   Airway Management Planned: Oral ETT  Additional Equipment:   Intra-op Plan:   Post-operative Plan: Extubation in OR  Informed Consent: I have reviewed the patients History and Physical, chart, labs and discussed the procedure including the risks, benefits and alternatives for the proposed anesthesia with the patient or authorized representative who has indicated his/her understanding and acceptance.   Dental advisory given  Plan Discussed with: Anesthesiologist and CRNA  Anesthesia Plan Comments:         Anesthesia Quick Evaluation

## 2013-05-24 NOTE — H&P (Signed)
Felicia Bauer is an 52 y.o. female.   Chief Complaint: Tingling of the arms and legs HPI: The patient is a 52 year old female who is evaluated in the office for burning and tingling sensation arms and legs more so on the left than the right. She's had discomfort number of months with onset an event. She saw her medical doctor the was seen by a neurologist. She tried gabapentin which there's some mild relief. She underwent an MRI scan that was referred for neurosurgical consultation. When seen in the office she said the pain was so better with Neurontin is still quite annoying and numbness into her quite significant. Her MRI scan was reviewed which showed severe stenosis at C5-6 and C6-7 marked stenosis at C4-5. There is some high signal in the spinal cord behind the C5-6 disc level consistent with myelopathy. After discussing the options the patient requested surgery now comes for a three-level anterior cervical discectomy with fusion and plating. I had a long discussion with her regarding the risks and benefits of surgical intervention. The risks discussed include but are not limited to bleeding infection weakness numbness paralysis spinal fluid leak coma quadriplegia hoarseness and death. We have discussed alternative methods of therapy offered risks and benefits of nonintervention. She's had the opportunity to ask numerous questions and appears to understand. With this information in hand she has requested we proceed with surgery.  Past Medical History  Diagnosis Date  . Essential hypertension, benign   . Chest pain, unspecified   . Shortness of breath   . Insomnia, unspecified   . Iron deficiency anemia secondary to blood loss (chronic)   . Mitral valve disorders     MVP  . Obesity, unspecified   . Sleep disturbance, unspecified   . Panic disorder   . GERD (gastroesophageal reflux disease)   . Disturbance of skin sensation   . Paresthesias 03/22/2013    Past Surgical History   Procedure Laterality Date  . Child birth x2  [phone]  . Cervical ablation  05/2009  . Tubal ligation      No family history on file. Social History:  reports that she has never smoked. She has never used smokeless tobacco. She reports that she does not drink alcohol or use illicit drugs.  Allergies: No Known Allergies  Medications Prior to Admission  Medication Sig Dispense Refill  . aspirin 81 MG tablet Take 81 mg by mouth daily.      . clonazePAM (KLONOPIN) 1 MG tablet Take 0.5 mg by mouth as needed for anxiety.      Marland Kitchen Dexlansoprazole (DEXILANT PO) Take 60 mg by mouth daily.      . fexofenadine-pseudoephedrine (ALLEGRA-D 24) 180-240 MG per 24 hr tablet Take 1 tablet by mouth daily as needed (for sinus).      . gabapentin (NEURONTIN) 600 MG tablet Take 600 mg by mouth 3 (three) times daily.      . hydrochlorothiazide (HYDRODIURIL) 25 MG tablet Take 25 mg by mouth daily.      Marland Kitchen HYDROcodone-acetaminophen (NORCO/VICODIN) 5-325 MG per tablet Take 1-2 tablets by mouth every 6 (six) hours as needed for moderate pain.      Marland Kitchen leuprolide (LUPRON) 3.75 MG injection Inject 3.75 mg into the muscle every 28 (twenty-eight) days.      . nadolol (CORGARD) 20 MG tablet Take 20 mg by mouth daily.      . norethindrone (MICRONOR,CAMILA,ERRIN) 0.35 MG tablet Take 1 tablet by mouth daily.      Marland Kitchen  temazepam (RESTORIL) 15 MG capsule Take 15 mg by mouth as needed for anxiety.        No results found for this or any previous visit (from the past 48 hour(s)). No results found.  Positive for hypertension swelling of the leg pain when she walks and anxiety  Blood pressure 137/76, pulse 88, temperature 98.7 F (37.1 C), temperature source Oral, resp. rate 20, SpO2 100.00%.  The patient is 1-2+ upper extremity reflexes and lower extremity reflexes but she has risk Hoffmann's reflexes bilaterally. 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 The plan is for a three-level anterior cervical discectomy with fusion and plating.  Reinaldo Meeker, MD 05/24/2013, 7:39 AM

## 2013-05-24 NOTE — Op Note (Signed)
Preop diagnosis: Herniated disc with severe spinal stenosis and cervical myelopathy C4-5 C5-6 C6-7 Postop diagnosis: Same Procedure: C4-5 C5-6 C6-7 decompressive anterior cervical discectomy with trabecular metal interbody fusion and Trinica anterior cervical plating Surgeon: Brieanna Nau Assistant: Elsner  After and placed in the supine position and 10 pounds halter traction the patient's back was prepped and draped in usual sterile fashion. Localizing fluoroscopy was used prior to incision to identify the appropriate levels. Incision was then made in the right anterior neck supple midline and headed towards the medial aspect the sternal cremaster muscle. The platysma muscle was incised transversely and the natural fascial plane between the strap muscles medially and the sternal cremaster laterally was identified and followed down to the anterior aspect of the cervical spine. Longus Cole muscles were identified and split in the midline and Triple-A bilaterally with unipolar coagulation and Kitner dissection. Subcutaneous tract was placed for exposure and x-ray showed approach the appropriate levels. Using a 15 blade the is the disc at C4-5 C5-6 and C6-7 was incised. Using pituitary rongeurs and curettes approximately 90% of the disc material levels was removed. High-speed drill was used to widen the interspace as and bony shavings were saved for use later in the case. At this time the microscope was draped brought in the field and used for the remainder of the case. Start C4-5 the remainder of the disc material down the posterior longitudinal ligament was removed. Ligament was then incised transversely and the cut edges removed a Kerrison punch. Large amounts of herniated disc material removed to decompress spinal dura and proximal foramen bilaterally. At this time we inspected for any evidence of residual compression at this level and none could be identified. We then turned our attention to C6 MR once again  similar decompression was carried out. Once again large amounts of herniated disc material identified compressing the spinal dura and proximal foramen bilaterally this was removed to complete the decompression. Finally we addressed C5-6 with the most severe stenosis was located. Once again removed the disc material down the posterior longitudinal ligament. Once again the ligament was incised a very large amounts of soft disc the underlying spinal dura. At this time inspection was carried out once more in all 3 levels for any evidence of residual compression and none could be identified. Irrigation was carried out and any bleeding control proper coagulation Gelfoam. Measurements were taken and to 5 mm angled graft and one 5 mm parallel graft were chosen and filled a mixture of autologous bone morselized allograft. After irrigated once more confirming hemostasis once more the angled grafts were placed at C4-5 and C6-7 the parallel graft at C5-6. Fossae showed to be in excellent position. An appropriately length Trinica anterior cervical plate was then chosen. Under fluoroscopic guidance drill holes were placed followed by placing of 12 mm screws x8. The locking mechanism was rotated locked position final fluoroscopy showed good position of the plates screws and plugs. Irrigation was carried out and any bleeding control proper coagulation. A prevertebral drain was left in the prevertebral space and brought out through a separate stab wound incision. The wound was then closed with interrupted Vicryl on the does muscle and subcuticular layer. Steri-Strips were placed on the skin. Shortness was then applied and the patient was extubated and taken to recovery room in stable condition.

## 2013-05-24 NOTE — Preoperative (Signed)
Beta Blockers   Reason not to administer Beta Blockers:Not Applicable 

## 2013-05-25 ENCOUNTER — Encounter (HOSPITAL_COMMUNITY): Payer: Self-pay

## 2013-05-25 MED ORDER — PANTOPRAZOLE SODIUM 40 MG PO TBEC
40.0000 mg | DELAYED_RELEASE_TABLET | Freq: Every day | ORAL | Status: DC
  Administered 2013-05-25 – 2013-05-27 (×3): 40 mg via ORAL
  Filled 2013-05-25 (×2): qty 1

## 2013-05-25 NOTE — Progress Notes (Signed)
Report called and Pt transferred to 4N10. Rema Fendt, RN

## 2013-05-25 NOTE — Progress Notes (Signed)
Subjective: Patient reports Patient having severe dysesthesias in right upper extremity. Feels tingling pins and needles as though she was having acupuncture all up and down her right arm and hand  Objective: Vital signs in last 24 hours: Temp:  [98.4 F (36.9 C)-99.1 F (37.3 C)] 98.8 F (37.1 C) (12/13 0815) Pulse Rate:  [75-98] 87 (12/13 0815) Resp:  [16-18] 18 (12/13 0815) BP: (112-157)/(63-89) 131/72 mmHg (12/13 0815) SpO2:  [92 %-100 %] 92 % (12/13 0815)  Intake/Output from previous day: 12/12 0701 - 12/13 0700 In: 1700 [I.V.:1700] Out: 525 [Urine:365; Drains:60; Blood:100] Intake/Output this shift:    Incision is clean and dry. Swallowing is intact. Drain with minimal output. Motor function appears good in the deltoid bicep and tricep on the right grip strength tests at 4-50 patient somewhat reluctant to use upper extremity secondary to pain.  Lab Results: No results found for this basename: WBC, HGB, HCT, PLT,  in the last 72 hours BMET No results found for this basename: NA, K, CL, CO2, GLUCOSE, BUN, CREATININE, CALCIUM,  in the last 72 hours  Studies/Results: Dg Cervical Spine 1 View  05/24/2013   CLINICAL DATA:  C4- C7 ACDF  EXAM: DG CERVICAL SPINE - 1 VIEW; DG C-ARM 1-60 MIN  COMPARISON:  Cervical spine MRI -04/10/2013  FLUOROSCOPY TIME:  6 seconds  FINDINGS: A single lateral spot intraoperative radiographic image of the cervical spine is provided for review.  Provided image demonstrates the sequela of C4-C7 ACDF and intervertebral disc space replacement. Alignment appears near anatomic.  Enteric tube tip overlies the tracheal air column with tip excluded from view. Enteric tube tips are excluded from view.  Regional soft tissues are normal. No definite radiopaque foreign body.  IMPRESSION: Post C4-C7 ACDF without definite evidence of complication.   Electronically Signed   By: Simonne Come M.D.   On: 05/24/2013 13:01   Dg C-arm 1-60 Min  05/24/2013   CLINICAL DATA:  C4-  C7 ACDF  EXAM: DG CERVICAL SPINE - 1 VIEW; DG C-ARM 1-60 MIN  COMPARISON:  Cervical spine MRI -04/10/2013  FLUOROSCOPY TIME:  6 seconds  FINDINGS: A single lateral spot intraoperative radiographic image of the cervical spine is provided for review.  Provided image demonstrates the sequela of C4-C7 ACDF and intervertebral disc space replacement. Alignment appears near anatomic.  Enteric tube tip overlies the tracheal air column with tip excluded from view. Enteric tube tips are excluded from view.  Regional soft tissues are normal. No definite radiopaque foreign body.  IMPRESSION: Post C4-C7 ACDF without definite evidence of complication.   Electronically Signed   By: Simonne Come M.D.   On: 05/24/2013 13:01    Assessment/Plan: Stable postop. Right upper extremity dysesthesia. On Decadron and gabapentin. Will transferred to 4 north and observe in hospital for another day.  LOS: 1 day  Remove drain from neck. Continue Decadron gabapentin.   Evelena Masci J 05/25/2013, 9:27 AM

## 2013-05-25 NOTE — Progress Notes (Signed)
Patient c/o burning sensation,pain, tender to touch and pin needle feeling to her right arm. RN elevated patient's arm and ice pack was applied. MD was called and notified of of patient's status. Order was received and carried. RN will continue to monitor patient. Marin Roberts RN

## 2013-05-25 NOTE — Progress Notes (Signed)
Pt arrived on unit at 1145 via wheelchair with family and belongs at side from 3500. Pt A&O x4, MAE x4 but report pins and needles to RUE when touched and the pain radiates from fingers and shoot to her arms and shoulders; pt refuses pain med saying she was medicated before coming to unit. Collar on and align; neck dsg dry and intact. Pt oriented to unit, call light within reach and will continue to monitor pt quietly.

## 2013-05-26 MED ORDER — DEXAMETHASONE SODIUM PHOSPHATE 4 MG/ML IJ SOLN
4.0000 mg | Freq: Two times a day (BID) | INTRAMUSCULAR | Status: DC
  Administered 2013-05-26 – 2013-05-27 (×3): 4 mg via INTRAVENOUS
  Filled 2013-05-26 (×4): qty 1

## 2013-05-26 MED ORDER — WHITE PETROLATUM GEL
Status: AC
  Administered 2013-05-26: 07:00:00
  Filled 2013-05-26: qty 5

## 2013-05-26 NOTE — Progress Notes (Signed)
Subjective: Patient reports Patient reports persistent dysesthesias in the right upper extremity with difficulty using the right arm  Objective: Vital signs in last 24 hours: Temp:  [98.1 F (36.7 C)-98.9 F (37.2 C)] 98.2 F (36.8 C) (12/14 1044) Pulse Rate:  [79-91] 91 (12/14 1044) Resp:  [17-18] 18 (12/14 1044) BP: (118-138)/(68-83) 118/77 mmHg (12/14 1044) SpO2:  [95 %-100 %] 98 % (12/14 1044) Weight:  [83.008 kg (183 lb)] 83.008 kg (183 lb) (12/13 2100)  Intake/Output from previous day: 12/13 0701 - 12/14 0700 In: 3 [I.V.:3] Out: -  Intake/Output this shift:    Incision with dressing intact appears fairly stable minimal swelling. Motor function appears good in the deltoid bicep tricep and grip however dysesthesias make full testing very difficult.  Lab Results: No results found for this basename: WBC, HGB, HCT, PLT,  in the last 72 hours BMET No results found for this basename: NA, K, CL, CO2, GLUCOSE, BUN, CREATININE, CALCIUM,  in the last 72 hours  Studies/Results: Dg Cervical Spine 1 View  05/24/2013   CLINICAL DATA:  C4- C7 ACDF  EXAM: DG CERVICAL SPINE - 1 VIEW; DG C-ARM 1-60 MIN  COMPARISON:  Cervical spine MRI -04/10/2013  FLUOROSCOPY TIME:  6 seconds  FINDINGS: A single lateral spot intraoperative radiographic image of the cervical spine is provided for review.  Provided image demonstrates the sequela of C4-C7 ACDF and intervertebral disc space replacement. Alignment appears near anatomic.  Enteric tube tip overlies the tracheal air column with tip excluded from view. Enteric tube tips are excluded from view.  Regional soft tissues are normal. No definite radiopaque foreign body.  IMPRESSION: Post C4-C7 ACDF without definite evidence of complication.   Electronically Signed   By: Simonne Come M.D.   On: 05/24/2013 13:01   Dg C-arm 1-60 Min  05/24/2013   CLINICAL DATA:  C4- C7 ACDF  EXAM: DG CERVICAL SPINE - 1 VIEW; DG C-ARM 1-60 MIN  COMPARISON:  Cervical spine MRI  -04/10/2013  FLUOROSCOPY TIME:  6 seconds  FINDINGS: A single lateral spot intraoperative radiographic image of the cervical spine is provided for review.  Provided image demonstrates the sequela of C4-C7 ACDF and intervertebral disc space replacement. Alignment appears near anatomic.  Enteric tube tip overlies the tracheal air column with tip excluded from view. Enteric tube tips are excluded from view.  Regional soft tissues are normal. No definite radiopaque foreign body.  IMPRESSION: Post C4-C7 ACDF without definite evidence of complication.   Electronically Signed   By: Simonne Come M.D.   On: 05/24/2013 13:01    Assessment/Plan: Dysesthesias of right upper extremity mostly in C7 distribution.  LOS: 2 days  Patient does not feel comfortable are well and left to go home at this time. Continue steroids and gabapentin.   Steel Kerney J 05/26/2013, 11:22 AM

## 2013-05-27 MED ORDER — HYDROCODONE-ACETAMINOPHEN 5-325 MG PO TABS: 1.0000 | ORAL_TABLET | ORAL | Status: DC | PRN

## 2013-05-27 MED ORDER — METHYLPREDNISOLONE (PAK) 4 MG PO TABS: ORAL_TABLET | ORAL | Status: DC

## 2013-05-27 MED ORDER — GABAPENTIN 600 MG PO TABS: 600.0000 mg | ORAL_TABLET | Freq: Three times a day (TID) | ORAL | Status: AC

## 2013-05-27 NOTE — Progress Notes (Signed)
Patient ready for discharge. Assessments remain unchanged as at now. Instructions given and dully signed. Wheeled down by volunteer accompanying by family members.

## 2013-05-27 NOTE — Discharge Summary (Signed)
  Physician Discharge Summary  Patient ID: Felicia Bauer MRN: [id number] DOB/AGE: December 09, 1960 52 y.o.  Admit date: 05/24/2013 Discharge date: 05/27/2013  Admission Diagnoses:  Discharge Diagnoses:  Active Problems:   Cervical myelopathy   Discharged Condition: good  Hospital Course: Surgery 3 days ago for severe spinal stenosis and cord contusion. Underwent 3 level acdf. Woke with relief of her pre op pain, but had dysesthetic pain in her rue. Treated with steroids and neurotin. Situation atable. Home pod 3 with specific instructions given.  Consults: None  Significant Diagnostic Studies: none  Treatments: surgery: C 45 C 56 C 67 acdf with plating  Discharge Exam: Blood pressure 131/85, pulse 77, temperature 97.7 F (36.5 C), temperature source Oral, resp. rate 20, height 5\' 1"  (1.549 m), weight 83.008 kg (183 lb), last menstrual period 12/11/2012, SpO2 98.00%. positive for dysesthetic pain right arm; strength 5/5 when tested directly; wound clean and dry  Disposition: Final discharge disposition not confirmed   Future Appointments Provider Department Dept Phone   07/25/2013 3:30 PM Huston Foley, MD [facility] [phone]       Medication List    ASK your doctor about these medications       aspirin 81 MG tablet  Take 81 mg by mouth daily.     clonazePAM 1 MG tablet  Commonly known as:  KLONOPIN  Take 0.5 mg by mouth as needed for anxiety.     DEXILANT PO  Take 60 mg by mouth daily.     fexofenadine-pseudoephedrine 180-240 MG per 24 hr tablet  Commonly known as:  ALLEGRA-D 24  Take 1 tablet by mouth daily as needed (for sinus).     gabapentin 600 MG tablet  Commonly known as:  NEURONTIN  Take 600 mg by mouth 3 (three) times daily.     hydrochlorothiazide 25 MG tablet  Commonly known as:  HYDRODIURIL  Take 25 mg by mouth daily.     HYDROcodone-acetaminophen 5-325 MG per tablet  Commonly known as:  NORCO/VICODIN  Take 1-2 tablets by  mouth every 6 (six) hours as needed for moderate pain.     leuprolide 3.75 MG injection  Commonly known as:  LUPRON  Inject 3.75 mg into the muscle every 28 (twenty-eight) days.     nadolol 20 MG tablet  Commonly known as:  CORGARD  Take 20 mg by mouth daily.     norethindrone 0.35 MG tablet  Commonly known as:  MICRONOR,CAMILA,ERRIN  Take 1 tablet by mouth daily.     temazepam 15 MG capsule  Commonly known as:  RESTORIL  Take 15 mg by mouth as needed for anxiety.         At home rest most of the time. Get up 9 or 10 times each day and take a 15 or 20 minute walk. No riding in the car and to your first postoperative appointment. If you have neck surgery you may shower from the chest down starting on the third postoperative day. If you had back surgery he may start showering on the third postoperative day with saran wrap wrapped around your incisional area 3 times. After the shower remove the saran wrap. Take pain medicine as needed and other medications as instructed. Call my office for an appointment.  SignedReinaldo Meeker, MD 05/27/2013, 8:59 AM

## 2013-05-28 ENCOUNTER — Encounter (HOSPITAL_COMMUNITY): Payer: Self-pay | Admitting: Neurosurgery

## 2013-06-11 ENCOUNTER — Other Ambulatory Visit: Payer: Self-pay | Admitting: Neurosurgery

## 2013-06-11 DIAGNOSIS — M4712 Other spondylosis with myelopathy, cervical region: Secondary | ICD-10-CM

## 2013-06-18 ENCOUNTER — Ambulatory Visit
Admission: RE | Admit: 2013-06-18 | Discharge: 2013-06-18 | Disposition: A | Discharge status: Home / Self Care | Payer: BC Managed Care – PPO | Source: Ambulatory Visit | Attending: Neurosurgery | Admitting: Neurosurgery

## 2013-06-18 DIAGNOSIS — M4712 Other spondylosis with myelopathy, cervical region: Secondary | ICD-10-CM

## 2013-07-25 ENCOUNTER — Ambulatory Visit (INDEPENDENT_AMBULATORY_CARE_PROVIDER_SITE_OTHER): Payer: BC Managed Care – PPO | Admitting: Neurology

## 2013-07-25 ENCOUNTER — Encounter: Payer: Self-pay | Admitting: Neurology

## 2013-07-25 VITALS — BP 136/90 | HR 73 | Temp 99.0°F | Ht 62.0 in | Wt 180.0 lb

## 2013-07-25 DIAGNOSIS — M79601 Pain in right arm: Secondary | ICD-10-CM

## 2013-07-25 DIAGNOSIS — R209 Unspecified disturbances of skin sensation: Secondary | ICD-10-CM

## 2013-07-25 DIAGNOSIS — M79609 Pain in unspecified limb: Secondary | ICD-10-CM

## 2013-07-25 DIAGNOSIS — G9520 Unspecified cord compression: Secondary | ICD-10-CM

## 2013-07-25 DIAGNOSIS — Z9889 Other specified postprocedural states: Secondary | ICD-10-CM

## 2013-07-25 DIAGNOSIS — G959 Disease of spinal cord, unspecified: Secondary | ICD-10-CM

## 2013-07-25 DIAGNOSIS — R202 Paresthesia of skin: Secondary | ICD-10-CM

## 2013-07-25 DIAGNOSIS — M4802 Spinal stenosis, cervical region: Secondary | ICD-10-CM

## 2013-07-25 NOTE — Progress Notes (Signed)
Subjective:    Patient ID: Felicia Bauer is a 53 y.o. female.  HPI    Interim history:   Felicia Bauer is a very pleasant 53 year old right-handed woman with an underlying medical history of hypertension, reflux disease, allergic rhinitis, anemia, and panic disorder, who presents for followup consultation of Felicia Bauer paresthesias and numbness in Felicia Bauer upper and lower extremities. She is unaccompanied today. I first met Felicia Bauer on 03/22/2013, at which time she reported a several month history of upper and lower extremity numbness and tingling as well as burning sensation. She also had Lhermitte's sign. Lab work done by Felicia Bauer PCP on 03/01/2013 showed normal vitamin B12 which was on the lower end of the spectrum at 289, normal TSH, normal SPEP. She was taking gabapentin 300 mg, which helped. At the time of Felicia Bauer visit I suggested further workup in the form of blood work, EMG and nerve conduction testing, and cervical spine MRI. Felicia Bauer blood work from 03/22/2013 showed normal RPR, normal CK, normal ESR, normal ANA, normal vitamin B6, normal heavy metal profile. We called Felicia Bauer with the test results. Felicia Bauer EMG and nerve conduction test results from 04/09/2013 showed normal findings. We called Felicia Bauer with the test results. She has cervical spine MRI without contrast on 04/10/2013: Abnormal MRI cervical spine (without) demonstrating: 1. At C5-6 disc bulging with right posterior lateral disc protrusion, with severe spinal stenosis, T2 hyperintense signal within the spinal cord, and severe bilateral foraminal stenosis. AP diameter of the spinal canal measures 4 mm. 2. At C6-7 disc bulging with moderate to severe spinal stenosis and severe bilateral foraminal stenosis. 3. At C4-5 broad disc bulging with small focal disc protrusion centrally, with mild spinal stenosis and severe bilateral foraminal stenosis; faint T2 hyperintense signal within the spinal cord. I called Felicia Bauer with the test results and suggested neurosurgical consultation.  I referred Felicia Bauer to neurosurgery and on 05/24/2013 she had C4-5 C5-6 C6-7 decompressive anterior cervical discectomy with trabecular metal interbody fusion and Trinica anterior cervical plating under Dr. Hal Neer. She had a cervical spine x-ray on 05/24/2013 which showed post C4-C7 ACDF without definitive evidence of complication. She had a repeat MRI cervical spine on 06/18/2013 which showed: Interval C4-C7 ACDF with improved spinal and neural foraminal stenosis as above. There is residual moderate spinal canal narrowing at C5-6 due to T2 hyperintense material extending posteriorly from the disc space level, which may represent bone graft material and/or postoperative fluid. Abnormal spinal cord signal at C5-6 is stable to minimally increased.  Today, she reports having done well after surgery with essentially resolution of Felicia Bauer paresthesias in Felicia Bauer feet and legs as well as resolution of the burning sensation in Felicia Bauer left hand but she has new right wrist and forearm burning pain in the medial aspect of Felicia Bauer arm which seems to crawl out and travel up into Felicia Bauer right shoulder. She feels that this is new after the surgery. Touching the area makes it worse and it is very difficult for Felicia Bauer to take a shower. She has to take hydrocodone and wait for about 40 minutes for it to kick in before she can take a shower. She has healed well. She has no other new complaints. 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  Felicia Bauer Past Medical History Is Significant For: Past Medical History  Diagnosis Date  . Essential hypertension, benign   .  Chest pain, unspecified   . Shortness of breath   . Insomnia, unspecified   . Iron deficiency anemia secondary to blood loss (chronic)   . Mitral valve disorders     MVP  . Obesity, unspecified   . Sleep disturbance, unspecified   . Panic disorder   . GERD (gastroesophageal  reflux disease)   . Disturbance of skin sensation   . Paresthesias 03/22/2013    Felicia Bauer Past Surgical History Is Significant For: Past Surgical History  Procedure Laterality Date  . Child birth x2  [phone]  . Cervical ablation  05/2009  . Tubal ligation    . Anterior cervical decomp/discectomy fusion N/A 05/24/2013    Procedure: CERVICAL FOUR-FIVE, CERVICAL FIVE-SIX, CERVICAL SIX-SEVEN ANTERIOR CERVICAL DECOMPRESSION/DISCECTOMY FUSION 3 LEVELS;  Surgeon: Faythe Ghee, MD;  Location: [facility];  Service: Neurosurgery;  Laterality: N/A;    Felicia Bauer Family History Is Significant For: History reviewed. No pertinent family history.  Felicia Bauer Social History Is Significant For: History   Social History  . Marital Status: Legally Separated    Spouse Name: N/A    Number of Children: N/A  . Years of Education: N/A   Social History Main Topics  . Smoking status: Never Smoker   . Smokeless tobacco: Never Used  . Alcohol Use: No  . Drug Use: No  . Sexual Activity: None   Other Topics Concern  . None   Social History Narrative  . None    Felicia Bauer Allergies Are:  No Known Allergies:   Felicia Bauer Current Medications Are:  Outpatient Encounter Prescriptions as of 07/25/2013  Medication Sig  . clonazePAM (KLONOPIN) 1 MG tablet Take 0.5 mg by mouth as needed for anxiety.  Marland Kitchen Dexlansoprazole (DEXILANT PO) Take 60 mg by mouth daily.  . fexofenadine-pseudoephedrine (ALLEGRA-D 24) 180-240 MG per 24 hr tablet Take 1 tablet by mouth daily as needed (for sinus).  . gabapentin (NEURONTIN) 600 MG tablet Take 1 tablet (600 mg total) by mouth 3 (three) times daily.  . hydrochlorothiazide (HYDRODIURIL) 25 MG tablet Take 25 mg by mouth daily.  Marland Kitchen HYDROcodone-acetaminophen (NORCO/VICODIN) 5-325 MG per tablet Take 1-2 tablets by mouth every 4 (four) hours as needed for moderate pain.  Marland Kitchen leuprolide (LUPRON) 3.75 MG injection Inject 3.75 mg into the muscle every 28 (twenty-eight) days.  . nadolol (CORGARD) 20 MG  tablet Take 20 mg by mouth daily.  . norethindrone (MICRONOR,CAMILA,ERRIN) 0.35 MG tablet Take 1 tablet by mouth daily.  . temazepam (RESTORIL) 15 MG capsule Take 15 mg by mouth as needed for anxiety.  . [DISCONTINUED] gabapentin (NEURONTIN) 600 MG tablet Take 600 mg by mouth 3 (three) times daily.  . [DISCONTINUED] HYDROcodone-acetaminophen (NORCO/VICODIN) 5-325 MG per tablet Take 1-2 tablets by mouth every 6 (six) hours as needed for moderate pain.  . [DISCONTINUED] methylPREDNIsolone (MEDROL DOSPACK) 4 MG tablet follow package directions  :  Review of Systems:  Out of a complete 14 point review of systems, all are reviewed and negative with the exception of these symptoms as listed below:  Review of Systems  Constitutional: Negative.   HENT: Negative.   Eyes: Negative.   Respiratory: Negative.   Cardiovascular: Positive for palpitations.  Gastrointestinal: Negative.   Endocrine: Negative.   Genitourinary: Negative.   Musculoskeletal: Negative.   Skin: Negative.   Allergic/Immunologic: Negative.   Neurological: Positive for numbness.  Hematological: Negative.   Psychiatric/Behavioral: Positive for sleep disturbance (insomnia).    Objective:  Neurologic Exam  Physical Exam Physical Examination:   Felicia Bauer  Vitals:   07/25/13 1543  BP: 136/90  Pulse: 73  Temp: 99 F (37.2 C)    General Examination: The patient is a very pleasant 53 y.o. female in no acute distress. She appears well-developed and well-nourished and well groomed.   HEENT: Normocephalic, atraumatic, pupils are equal, round and reactive to light and accommodation. Funduscopic exam is normal with sharp disc margins noted. Extraocular tracking is good without limitation to gaze excursion or nystagmus noted. Normal smooth pursuit is noted. Hearing is grossly intact. Tympanic membranes are clear bilaterally. Face is symmetric with normal facial animation and normal facial sensation. Speech is clear with no dysarthria  noted. There is no hypophonia. There is no lip, neck/head, jaw or voice tremor. Neck is supple with full range of passive and active motion. There are no carotid bruits on auscultation. Oropharynx exam reveals: mild mouth dryness, adequate dental hygiene and mild airway crowding. Mallampati is class I. Tongue protrudes centrally and palate elevates symmetrically. Tonsils are 2+ in size.   Chest: Clear to auscultation without wheezing, rhonchi or crackles noted.  Heart: S1+S2+0, regular and normal without murmurs, rubs or gallops noted.   Abdomen: Soft, non-tender and non-distended with normal bowel sounds appreciated on auscultation.  Extremities: There is no pitting edema in the distal lower extremities bilaterally. Pedal pulses are intact.  Skin: Warm and dry without trophic changes noted. There are no varicose veins.  Musculoskeletal: exam reveals no obvious joint deformities, tenderness or joint swelling or erythema.   Neurologically:  Mental status: The patient is awake, alert and oriented in all 4 spheres. Felicia Bauer memory, attention, language and knowledge are appropriate. There is no aphasia, agnosia, apraxia or anomia. Speech is clear with normal prosody and enunciation. Thought process is linear. Mood is congruent and affect is normal.  Cranial nerves are as described above under HEENT exam. In addition, shoulder shrug is normal with equal shoulder height noted. Motor exam: Normal bulk, strength and tone is noted with the exception of mild grip strength weakness in the L hand, which is better. There is no drift, tremor or rebound. Romberg is negative. Reflexes are 2+ throughout. Toes are downgoing bilaterally. Fine motor skills are intact with normal finger taps, normal hand movements, normal rapid alternating patting, normal foot taps and normal foot agility.  Cerebellar testing shows no dysmetria or intention tremor on finger to nose testing. Heel to shin is unremarkable bilaterally. There is  no truncal or gait ataxia.  Sensory exam is intact to light touch, pinprick, vibration, temperature sense in the upper and lower extremities, and the only residual tingling is in digits 1-3 of the L hand and she has burning pain in the R medial wrist and forearm.   Gait, station and balance are unremarkable. No veering to one side is noted. No leaning to one side is noted. Posture is age-appropriate and stance is narrow based. No problems turning are noted. She turns en bloc. Tandem walk is unremarkable. Intact toe and heel stance is noted.               Assessment and Plan:   In summary, Felicia Bauer is a very pleasant 53 y.o.-year old female with a history of cervical myelopathy and s/p recent C4-C7 ACDF with residual mild L arm paresthesias and R forearm pain, which is new since the surgery. Overall she has done well. I reassured Felicia Bauer that Felicia Bauer scans look better and that Felicia Bauer clinical exam is improved and she admits that she feels better  but Felicia Bauer right arm pain can be quite severe at times. I explained to Felicia Bauer that sometimes it takes several months to feel better and she still has some cord signal changes and that will take time to get better. She is advised to follow Felicia Bauer neurosurgeon's instructions and also stay out of work as advised by him for an additional 6 weeks. She was considering going back to work after another 2 weeks. I asked Felicia Bauer not to be too hasty with resuming work. She has a followup with Felicia Bauer neurosurgeon about a month. I think she is going to see ongoing improvement and I encouraged Felicia Bauer to be patient. I will see Felicia Bauer back on an as-needed basis at this juncture. She was in agreement. I explained Felicia Bauer presurgical and postsurgical cervical MRIs to Felicia Bauer.

## 2013-08-26 ENCOUNTER — Telehealth: Payer: Self-pay | Admitting: Neurology

## 2013-08-26 NOTE — Telephone Encounter (Signed)
See below. Looks like pt has seen Dr. Rexene Alberts  at [facility]. Message routed via [facility] to Dr. Rexene Alberts. / Gayleen Orem.

## 2013-08-26 NOTE — Telephone Encounter (Signed)
This is not my patient.  She is followed by [facility].

## 2013-08-26 NOTE — Telephone Encounter (Signed)
Please advise on this.  

## 2013-08-26 NOTE — Telephone Encounter (Signed)
Disregard message, this was entered on incorrect patient. Our office will have this corrected. [facility]

## 2013-08-26 NOTE — Telephone Encounter (Signed)
Pt called, stating she has been exposed to mold in a home she is renting. The landlord came in and cleaned the property with a bleach solution but patient feels she has been exposed to mold toxins. Is there any type of testing or bloodwork we can recommend or order to check for this. CB# [phone] / Venida Jarvis

## 2013-08-26 NOTE — Telephone Encounter (Signed)
I think this is a question best answered by her primary care physician. She may be able to get tested by an allergy specialist.

## 2013-08-27 NOTE — Telephone Encounter (Signed)
Called patient and patient stated that she did not call about this issue and I notice that the original message was sent to [place] incorrectly.

## 2013-12-20 ENCOUNTER — Other Ambulatory Visit: Payer: Self-pay | Admitting: Obstetrics and Gynecology

## 2013-12-23 LAB — CYTOLOGY - PAP

## 2014-05-17 ENCOUNTER — Encounter (HOSPITAL_BASED_OUTPATIENT_CLINIC_OR_DEPARTMENT_OTHER): Payer: Self-pay | Admitting: *Deleted

## 2014-05-17 ENCOUNTER — Emergency Department (HOSPITAL_BASED_OUTPATIENT_CLINIC_OR_DEPARTMENT_OTHER)
Admission: EM | Admit: 2014-05-17 | Discharge: 2014-05-17 | Disposition: A | Discharge status: Home / Self Care | Payer: BC Managed Care – PPO | Attending: Emergency Medicine | Admitting: Emergency Medicine

## 2014-05-17 DIAGNOSIS — K219 Gastro-esophageal reflux disease without esophagitis: Secondary | ICD-10-CM | POA: Diagnosis not present

## 2014-05-17 DIAGNOSIS — W1830XA Fall on same level, unspecified, initial encounter: Secondary | ICD-10-CM | POA: Insufficient documentation

## 2014-05-17 DIAGNOSIS — Z8669 Personal history of other diseases of the nervous system and sense organs: Secondary | ICD-10-CM | POA: Insufficient documentation

## 2014-05-17 DIAGNOSIS — Y9301 Activity, walking, marching and hiking: Secondary | ICD-10-CM | POA: Diagnosis not present

## 2014-05-17 DIAGNOSIS — Y998 Other external cause status: Secondary | ICD-10-CM | POA: Insufficient documentation

## 2014-05-17 DIAGNOSIS — F410 Panic disorder [episodic paroxysmal anxiety] without agoraphobia: Secondary | ICD-10-CM | POA: Insufficient documentation

## 2014-05-17 DIAGNOSIS — Z79899 Other long term (current) drug therapy: Secondary | ICD-10-CM | POA: Diagnosis not present

## 2014-05-17 DIAGNOSIS — S01511A Laceration without foreign body of lip, initial encounter: Secondary | ICD-10-CM | POA: Diagnosis not present

## 2014-05-17 DIAGNOSIS — E669 Obesity, unspecified: Secondary | ICD-10-CM | POA: Diagnosis not present

## 2014-05-17 DIAGNOSIS — Z862 Personal history of diseases of the blood and blood-forming organs and certain disorders involving the immune mechanism: Secondary | ICD-10-CM | POA: Diagnosis not present

## 2014-05-17 DIAGNOSIS — Y92090 Kitchen in other non-institutional residence as the place of occurrence of the external cause: Secondary | ICD-10-CM | POA: Diagnosis not present

## 2014-05-17 DIAGNOSIS — S0990XA Unspecified injury of head, initial encounter: Secondary | ICD-10-CM | POA: Diagnosis not present

## 2014-05-17 MED ORDER — OXYCODONE-ACETAMINOPHEN 5-325 MG PO TABS
1.0000 | ORAL_TABLET | Freq: Once | ORAL | Status: AC
  Administered 2014-05-17: 1 via ORAL
  Filled 2014-05-17: qty 1

## 2014-05-17 MED ORDER — PENICILLIN V POTASSIUM 500 MG PO TABS: 500.0000 mg | ORAL_TABLET | Freq: Three times a day (TID) | ORAL | Status: DC

## 2014-05-17 MED ORDER — NAPROXEN 500 MG PO TABS: 500.0000 mg | ORAL_TABLET | Freq: Two times a day (BID) | ORAL | Status: DC

## 2014-05-17 MED ORDER — LIDOCAINE-EPINEPHRINE 2 %-1:100000 IJ SOLN
5.0000 mL | Freq: Once | INTRAMUSCULAR | Status: AC
  Administered 2014-05-17: 5 mL
  Filled 2014-05-17: qty 1

## 2014-05-17 MED ORDER — OXYCODONE-ACETAMINOPHEN 5-325 MG PO TABS: 1.0000 | ORAL_TABLET | Freq: Four times a day (QID) | ORAL | Status: DC | PRN

## 2014-05-17 MED ORDER — BUPIVACAINE-EPINEPHRINE (PF) 0.5% -1:200000 IJ SOLN
1.8000 mL | Freq: Once | INTRAMUSCULAR | Status: AC
  Administered 2014-05-17: 1.8 mL
  Filled 2014-05-17: qty 1.8

## 2014-05-17 NOTE — Discharge Instructions (Signed)
Please read and follow all provided instructions.  Your diagnoses today include:  1. Head injury, initial encounter     Tests performed today include:  Vital signs. See below for your results today.   Medications prescribed:   Percocet (oxycodone/acetaminophen) - narcotic pain medication  DO NOT drive or perform any activities that require you to be awake and alert because this medicine can make you drowsy. BE VERY CAREFUL not to take multiple medicines containing Tylenol (also called acetaminophen). Doing so can lead to an overdose which can damage your liver and cause liver failure and possibly death.   Naproxen - anti-inflammatory pain medication  Do not exceed 500mg  naproxen every 12 hours, take with food  You have been prescribed an anti-inflammatory medication or NSAID. Take with food. Take smallest effective dose for the shortest duration needed for your pain. Stop taking if you experience stomach pain or vomiting.    Penicillin - antibiotic  You have been prescribed an antibiotic medicine: take the entire course of medicine even if you are feeling better. Stopping early can cause the antibiotic not to work.  Take any prescribed medications only as directed.  Home care instructions:  Follow any educational materials contained in this packet.  BE VERY CAREFUL not to take multiple medicines containing Tylenol (also called acetaminophen). Doing so can lead to an overdose which can damage your liver and cause liver failure and possibly death.   Follow-up instructions: Please follow-up with your dentist or the referral listed as soon as possible for evaluation of your injured teeth.   Return instructions:  SEEK IMMEDIATE MEDICAL ATTENTION IF:  There is confusion or drowsiness (although children frequently become drowsy after injury).   You cannot awaken the injured person.   You have more than one episode of vomiting.   You notice dizziness or unsteadiness which is  getting worse, or inability to walk.   You have convulsions or unconsciousness.   You experience severe, persistent headaches not relieved by Tylenol.  You cannot use arms or legs normally.   There are changes in pupil sizes. (This is the black center in the colored part of the eye)   There is clear or bloody discharge from the nose or ears.   You have change in speech, vision, swallowing, or understanding.   Localized weakness, numbness, tingling, or change in bowel or bladder control.  You have any other emergent concerns.  Additional Information: You have had a head injury which does not appear to require admission at this time.  Your vital signs today were: BP 161/97 mmHg   Pulse 89   Temp(Src) 99.1 F (37.3 C) (Oral)   Resp 18   Ht 5\' 1"  (1.549 m)   Wt 190 lb (86.183 kg)   BMI 35.92 kg/m2   SpO2 99% If your blood pressure (BP) was elevated above 135/85 this visit, please have this repeated by your doctor within one month. --------------

## 2014-05-17 NOTE — ED Notes (Signed)
EDPA at [facility], family at [facility], pt alert, NAD, calm, interactive.

## 2014-05-17 NOTE — ED Provider Notes (Signed)
Medical screening examination/treatment/procedure(s) were conducted as a shared visit with non-physician practitioner(s) and myself.  I personally evaluated the patient during the encounter.   EKG Interpretation None      Patient with fall at home slipped on the kitchen floor landed the order mouth that instant bleeding from the mouth. Her upper midline right incisor basically was knocked out. Tried to replace it but it will not go away back in the socket. It may be a fracture there. The incisor just to the right of it is loose but seems to be fairly intact no fractures to the teeth that are visible. Patient does have a dentist. However will discuss with on-call dentist tonight for recommendations. Suspect that the the right upper middle incisor is not going to be salvageable. Patient without any other injuries.  Fredia Sorrow, MD 05/17/14 2037

## 2014-05-17 NOTE — ED Notes (Addendum)
Pt reports she slipped in kitchen and fell- landed on face- front teeth bleeding and loose- unknown LOC

## 2014-05-17 NOTE — ED Provider Notes (Signed)
CSN: [id number]     Arrival date & time 05/17/14  1848 History   First MD Initiated Contact with Patient 05/17/14 1903     Chief Complaint  Patient presents with  . Facial Injury     (Consider location/radiation/quality/duration/timing/severity/associated sxs/prior Treatment) HPI Comments: Patient presents with chief complaint of facial injury. Patient was walking in her kitchen just prior to arrival when she slipped and fell forward landing on her face. Patient had bleeding from her mouth. She noted that her right first maxillary incisor was partially extruded. She also bit the inside of her lower lip. She complains of frontal headache. She is uncertain if she lost consciousness. She woke and was able to stand up. No treatments prior to arrival. No nausea or vomiting. No vision change. Patient does not take any types of blood thinning medications. No weakness or numbness in her extremities. She does have some soreness in her right hand but is able to move everything normally.  Patient is a 53 y.o. female presenting with facial injury. The history is provided by the patient.  Facial Injury Associated symptoms: headaches   Associated symptoms: no nausea, no neck pain and no vomiting     Past Medical History  Diagnosis Date  . Essential hypertension, benign   . Chest pain, unspecified   . Shortness of breath   . Insomnia, unspecified   . Iron deficiency anemia secondary to blood loss (chronic)   . Mitral valve disorders     MVP  . Obesity, unspecified   . Sleep disturbance, unspecified   . Panic disorder   . GERD (gastroesophageal reflux disease)   . Disturbance of skin sensation   . Paresthesias 03/22/2013   Past Surgical History  Procedure Laterality Date  . Child birth x2  [phone]  . Cervical ablation  05/2009  . Tubal ligation    . Anterior cervical decomp/discectomy fusion N/A 05/24/2013    Procedure: CERVICAL FOUR-FIVE, CERVICAL FIVE-SIX, CERVICAL SIX-SEVEN ANTERIOR  CERVICAL DECOMPRESSION/DISCECTOMY FUSION 3 LEVELS;  Surgeon: Faythe Ghee, MD;  Location: [facility];  Service: Neurosurgery;  Laterality: N/A;   No family history on file. History  Substance Use Topics  . Smoking status: Never Smoker   . Smokeless tobacco: Never Used  . Alcohol Use: No   OB History    No data available     Review of Systems  Constitutional: Negative for fatigue.  HENT: Positive for dental problem. Negative for tinnitus.   Eyes: Negative for photophobia, pain and visual disturbance.  Respiratory: Negative for shortness of breath.   Cardiovascular: Negative for chest pain.  Gastrointestinal: Negative for nausea and vomiting.  Musculoskeletal: Negative for back pain, gait problem and neck pain.  Skin: Positive for wound.  Neurological: Positive for headaches. Negative for dizziness, weakness, light-headedness and numbness.  Psychiatric/Behavioral: Negative for confusion and decreased concentration.    Allergies  Review of patient's allergies indicates no known allergies.  Home Medications   Prior to Admission medications   Medication Sig Start Date End Date Taking? Authorizing Provider  clonazePAM (KLONOPIN) 1 MG tablet Take 0.5 mg by mouth as needed for anxiety.   Yes Historical Provider, MD  Dexlansoprazole (DEXILANT PO) Take 60 mg by mouth daily.   Yes Historical Provider, MD  fexofenadine-pseudoephedrine (ALLEGRA-D 24) 180-240 MG per 24 hr tablet Take 1 tablet by mouth daily as needed (for sinus).   Yes Historical Provider, MD  HYDROcodone-acetaminophen (NORCO/VICODIN) 5-325 MG per tablet Take 1-2 tablets by mouth every 4 (four)  hours as needed for moderate pain. 05/27/13  Yes Faythe Ghee, MD  leuprolide (LUPRON) 3.75 MG injection Inject 3.75 mg into the muscle every 28 (twenty-eight) days.   Yes Historical Provider, MD  nadolol (CORGARD) 20 MG tablet Take 20 mg by mouth daily.   Yes Historical Provider, MD  gabapentin (NEURONTIN) 600 MG tablet Take  1 tablet (600 mg total) by mouth 3 (three) times daily. 05/27/13   Faythe Ghee, MD  hydrochlorothiazide (HYDRODIURIL) 25 MG tablet Take 25 mg by mouth daily.    Historical Provider, MD  norethindrone (MICRONOR,CAMILA,ERRIN) 0.35 MG tablet Take 1 tablet by mouth daily.    Historical Provider, MD  temazepam (RESTORIL) 15 MG capsule Take 15 mg by mouth as needed for anxiety.    Historical Provider, MD   BP 157/106 mmHg  Pulse 81  Temp(Src) 99.1 F (37.3 C) (Oral)  Resp 20  Ht 5\' 1"  (1.549 m)  Wt 190 lb (86.183 kg)  BMI 35.92 kg/m2  SpO2 98%   Physical Exam  Constitutional: She is oriented to person, place, and time. She appears well-developed and well-nourished.  HENT:  Head: Normocephalic. Head is without raccoon's eyes and without Battle's sign.  Right Ear: Tympanic membrane, external ear and ear canal normal. No hemotympanum.  Left Ear: Tympanic membrane, external ear and ear canal normal. No hemotympanum.  Nose: Nose normal. No nasal septal hematoma.  Mouth/Throat: Uvula is midline, oropharynx is clear and moist and mucous membranes are normal.  Minor laceration inner lower lip. No TMJ pain. No malocclusion. R upper medial incisor (tooth #8) is extruded approximately 70mm. Right upper lateral incisor (tooth #7) is not extruded but is loose. No pain or deformity with palpation of gumline.  Eyes: Conjunctivae, EOM and lids are normal. Pupils are equal, round, and reactive to light. Right eye exhibits no nystagmus. Left eye exhibits no nystagmus.  No visible hyphema noted  Neck: Normal range of motion. Neck supple.  Cardiovascular: Normal rate and regular rhythm.   No murmur heard. Pulmonary/Chest: Effort normal and breath sounds normal.  Abdominal: Soft. There is no tenderness.  Musculoskeletal:       Cervical back: She exhibits normal range of motion, no tenderness and no bony tenderness.       Thoracic back: She exhibits no tenderness and no bony tenderness.       Lumbar back:  She exhibits no tenderness and no bony tenderness.  Neurological: She is alert and oriented to person, place, and time. She has normal strength and normal reflexes. No cranial nerve deficit or sensory deficit. Coordination normal. GCS eye subscore is 4. GCS verbal subscore is 5. GCS motor subscore is 6.  Skin: Skin is warm and dry.  Psychiatric: She has a normal mood and affect.  Nursing note and vitals reviewed.   ED Course  Procedures (including critical care time) Labs Review Labs Reviewed - No data to display  Imaging Review No results found.   EKG Interpretation None       7:33 PM Patient seen and examined. Work-up initiated. Medications ordered.   Vital signs reviewed and are as follows: BP 157/106 mmHg  Pulse 81  Temp(Src) 99.1 F (37.3 C) (Oral)  Resp 20  Ht 5\' 1"  (1.549 m)  Wt 190 lb (86.183 kg)  BMI 35.92 kg/m2  SpO2 98%  8:21 PM Dental block was performed. 1.49mL of 2% lidocaine with epi was combined with 1.39mL 0.5% bupivacaine with epi and dental block was performed. Injections made at  base of tooth as well as the tooth immediately posterior and anterior to tooth. Adequate anesthesia was obtained. Minimal bleeding after injections. Patient tolerated procedure well with no immediate complications.   Attempt made to replace incisor back into proper position. I was unable to push tooth into place or replace with patient biting down.   Patient discussed and seen by Dr. Rogene Houston.  Paged on-call dentist. No call back after 30 minutes. Contact was never made with dental on-call.  Patient was counseled on need to use soft diet and follow-up with her dentist as soon as possible. I've asked her to call her dentist tomorrow to see if anyone is in the office. I have also given her dental referral from ED tonight.  Patient counseled on use of narcotic pain medications. Counseled not to combine these medications with others containing tylenol. Urged not to drink alcohol,  drive, or perform any other activities that requires focus while taking these medications. The patient verbalizes understanding and agrees with the plan.   MDM   Final diagnoses:  Head injury, initial encounter   Head injury: Unclear loss of consciousness. No signs of closed head injury. Normal neurological exam. She is at baseline while in emergency department. No blood thinner use. Do not feel CT is indicated at this time.  Dental injury: Tooth #8 is partially extruded and likely not salvageable. Tooth #7 is loose. Patient to protect these areas as best as possible until she can follow-up with a dentist. Doubt alveolar ridge fracture at this point. Do not suspect jaw fracture without significant pain or malocclusion. Bleeding controlled prior to ED arrival.   Carlisle Cater, PA-C 05/17/14 2349

## 2014-05-19 NOTE — ED Notes (Signed)
Spoke to pt regarding referral to dentist.  Dr. Mariel Sleet office called and they were referring pt to an oral surgeon, Dr. Hardie Shackleton due to trauma.   Pt called and informed of process.  Pt waiting a call back regarding follow up.

## 2014-11-06 ENCOUNTER — Encounter: Payer: Self-pay | Admitting: *Deleted

## 2014-11-10 IMAGING — RF DG C-ARM 61-120 MIN
1 series · 1 of 1 positions shown · non-contrast
Comparison: Cervical spine MRI -04/10/2013

FLUOROSCOPY TIME:  6 seconds

CLINICAL DATA: C4- C7 ACDF

EXAM:
DG CERVICAL SPINE - 1 VIEW; DG C-ARM 1-60 MIN

[Series 1: run · 1 of 1 slices shown]
[im 1/1]
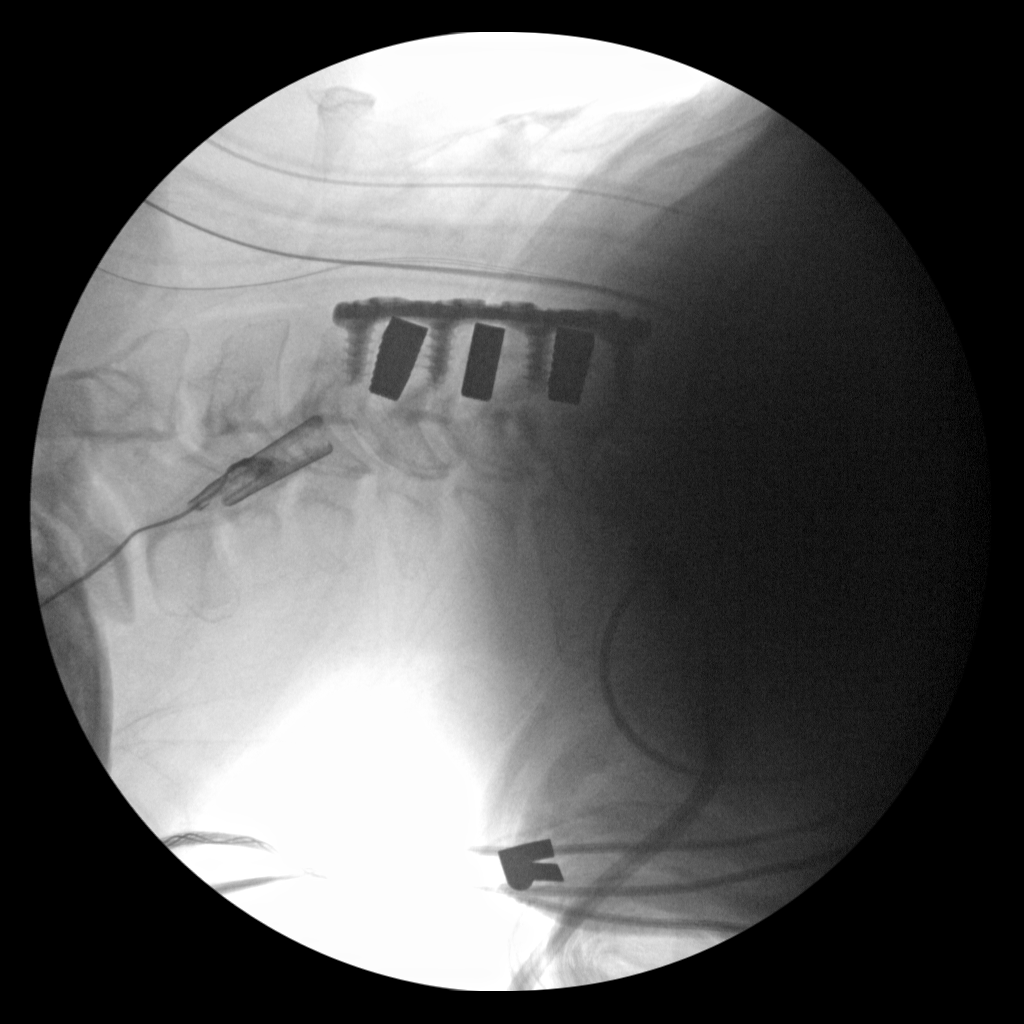

[1 of 1 positions shown; findings below may reference images not displayed]

FINDINGS: A single lateral spot intraoperative radiographic image of the
cervical spine is provided for review.

Provided image demonstrates the sequela of C4-C7 ACDF and
intervertebral disc space replacement. Alignment appears near
anatomic.

Enteric tube tip overlies the tracheal air column with tip excluded
from view. Enteric tube tips are excluded from view.

Regional soft tissues are normal. No definite radiopaque foreign
body.
IMPRESSION: Post C4-C7 ACDF without definite evidence of complication.

## 2015-03-25 ENCOUNTER — Ambulatory Visit (HOSPITAL_BASED_OUTPATIENT_CLINIC_OR_DEPARTMENT_OTHER)
Admission: RE | Admit: 2015-03-25 | Discharge: 2015-03-25 | Disposition: A | Discharge status: Home / Self Care | Payer: 59 | Source: Ambulatory Visit | Attending: Family Medicine | Admitting: Family Medicine

## 2015-03-25 ENCOUNTER — Other Ambulatory Visit (HOSPITAL_BASED_OUTPATIENT_CLINIC_OR_DEPARTMENT_OTHER): Payer: Self-pay | Admitting: Family Medicine

## 2015-03-25 DIAGNOSIS — M79661 Pain in right lower leg: Secondary | ICD-10-CM | POA: Insufficient documentation

## 2015-03-25 DIAGNOSIS — M79604 Pain in right leg: Secondary | ICD-10-CM

## 2015-03-25 DIAGNOSIS — M7989 Other specified soft tissue disorders: Secondary | ICD-10-CM | POA: Insufficient documentation

## 2015-03-25 DIAGNOSIS — M25561 Pain in right knee: Secondary | ICD-10-CM

## 2015-03-25 DIAGNOSIS — M7121 Synovial cyst of popliteal space [Baker], right knee: Secondary | ICD-10-CM | POA: Insufficient documentation

## 2016-06-13 HISTORY — PX: BREAST LUMPECTOMY: SHX2

## 2016-07-05 DIAGNOSIS — M4712 Other spondylosis with myelopathy, cervical region: Secondary | ICD-10-CM | POA: Diagnosis not present

## 2016-09-02 DIAGNOSIS — I10 Essential (primary) hypertension: Secondary | ICD-10-CM | POA: Diagnosis not present

## 2016-09-02 DIAGNOSIS — M4712 Other spondylosis with myelopathy, cervical region: Secondary | ICD-10-CM | POA: Diagnosis not present

## 2016-09-30 DIAGNOSIS — M5412 Radiculopathy, cervical region: Secondary | ICD-10-CM | POA: Diagnosis not present

## 2016-09-30 DIAGNOSIS — R202 Paresthesia of skin: Secondary | ICD-10-CM | POA: Diagnosis not present

## 2016-10-05 DIAGNOSIS — M5412 Radiculopathy, cervical region: Secondary | ICD-10-CM | POA: Diagnosis not present

## 2016-10-05 DIAGNOSIS — M4802 Spinal stenosis, cervical region: Secondary | ICD-10-CM | POA: Diagnosis not present

## 2016-10-05 DIAGNOSIS — M5011 Cervical disc disorder with radiculopathy,  high cervical region: Secondary | ICD-10-CM | POA: Diagnosis not present

## 2016-10-05 DIAGNOSIS — M4712 Other spondylosis with myelopathy, cervical region: Secondary | ICD-10-CM | POA: Diagnosis not present

## 2016-10-05 DIAGNOSIS — R202 Paresthesia of skin: Secondary | ICD-10-CM | POA: Diagnosis not present

## 2016-10-17 DIAGNOSIS — M4802 Spinal stenosis, cervical region: Secondary | ICD-10-CM | POA: Diagnosis not present

## 2016-10-17 DIAGNOSIS — M4712 Other spondylosis with myelopathy, cervical region: Secondary | ICD-10-CM | POA: Diagnosis not present

## 2016-10-17 DIAGNOSIS — M4722 Other spondylosis with radiculopathy, cervical region: Secondary | ICD-10-CM | POA: Diagnosis not present

## 2016-10-26 DIAGNOSIS — M5090 Cervical disc disorder, unspecified, unspecified cervical region: Secondary | ICD-10-CM | POA: Diagnosis not present

## 2016-10-26 DIAGNOSIS — M5000 Cervical disc disorder with myelopathy, unspecified cervical region: Secondary | ICD-10-CM | POA: Diagnosis not present

## 2016-10-26 DIAGNOSIS — M4802 Spinal stenosis, cervical region: Secondary | ICD-10-CM | POA: Diagnosis not present

## 2016-10-26 DIAGNOSIS — Z981 Arthrodesis status: Secondary | ICD-10-CM | POA: Diagnosis not present

## 2016-10-26 DIAGNOSIS — G959 Disease of spinal cord, unspecified: Secondary | ICD-10-CM | POA: Diagnosis not present

## 2016-10-26 DIAGNOSIS — I499 Cardiac arrhythmia, unspecified: Secondary | ICD-10-CM | POA: Diagnosis not present

## 2016-10-26 DIAGNOSIS — I10 Essential (primary) hypertension: Secondary | ICD-10-CM | POA: Diagnosis not present

## 2016-10-26 DIAGNOSIS — M5412 Radiculopathy, cervical region: Secondary | ICD-10-CM | POA: Diagnosis not present

## 2016-10-26 DIAGNOSIS — M4712 Other spondylosis with myelopathy, cervical region: Secondary | ICD-10-CM | POA: Diagnosis not present

## 2016-10-27 HISTORY — PX: ANTERIOR CERVICAL DECOMP/DISCECTOMY FUSION: SHX1161

## 2016-11-10 DIAGNOSIS — M4712 Other spondylosis with myelopathy, cervical region: Secondary | ICD-10-CM | POA: Diagnosis not present

## 2016-11-10 DIAGNOSIS — Z981 Arthrodesis status: Secondary | ICD-10-CM | POA: Diagnosis not present

## 2016-11-10 DIAGNOSIS — R609 Edema, unspecified: Secondary | ICD-10-CM | POA: Diagnosis not present

## 2016-11-15 DIAGNOSIS — M1711 Unilateral primary osteoarthritis, right knee: Secondary | ICD-10-CM | POA: Diagnosis not present

## 2016-11-28 DIAGNOSIS — Z4789 Encounter for other orthopedic aftercare: Secondary | ICD-10-CM | POA: Diagnosis not present

## 2017-01-02 DIAGNOSIS — I10 Essential (primary) hypertension: Secondary | ICD-10-CM | POA: Diagnosis not present

## 2017-01-02 DIAGNOSIS — M4712 Other spondylosis with myelopathy, cervical region: Secondary | ICD-10-CM | POA: Diagnosis not present

## 2017-01-30 DIAGNOSIS — M4802 Spinal stenosis, cervical region: Secondary | ICD-10-CM | POA: Diagnosis not present

## 2017-01-30 DIAGNOSIS — Z9889 Other specified postprocedural states: Secondary | ICD-10-CM | POA: Diagnosis not present

## 2017-03-13 DIAGNOSIS — E876 Hypokalemia: Secondary | ICD-10-CM | POA: Diagnosis not present

## 2017-03-28 DIAGNOSIS — G8928 Other chronic postprocedural pain: Secondary | ICD-10-CM | POA: Diagnosis not present

## 2017-03-28 DIAGNOSIS — R42 Dizziness and giddiness: Secondary | ICD-10-CM | POA: Diagnosis not present

## 2017-03-28 DIAGNOSIS — Z981 Arthrodesis status: Secondary | ICD-10-CM | POA: Diagnosis not present

## 2017-03-29 DIAGNOSIS — Z01419 Encounter for gynecological examination (general) (routine) without abnormal findings: Secondary | ICD-10-CM | POA: Diagnosis not present

## 2017-03-29 DIAGNOSIS — Z1231 Encounter for screening mammogram for malignant neoplasm of breast: Secondary | ICD-10-CM | POA: Diagnosis not present

## 2017-03-30 ENCOUNTER — Other Ambulatory Visit: Payer: Self-pay | Admitting: Obstetrics & Gynecology

## 2017-03-30 DIAGNOSIS — R928 Other abnormal and inconclusive findings on diagnostic imaging of breast: Secondary | ICD-10-CM

## 2017-04-07 ENCOUNTER — Other Ambulatory Visit: Payer: Self-pay | Admitting: Obstetrics & Gynecology

## 2017-04-07 ENCOUNTER — Ambulatory Visit
Admission: RE | Admit: 2017-04-07 | Discharge: 2017-04-07 | Disposition: A | Discharge status: Home / Self Care | Payer: 59 | Source: Ambulatory Visit | Attending: Obstetrics & Gynecology | Admitting: Obstetrics & Gynecology

## 2017-04-07 DIAGNOSIS — R928 Other abnormal and inconclusive findings on diagnostic imaging of breast: Secondary | ICD-10-CM

## 2017-04-07 DIAGNOSIS — N6489 Other specified disorders of breast: Secondary | ICD-10-CM | POA: Diagnosis not present

## 2017-04-07 DIAGNOSIS — R911 Solitary pulmonary nodule: Secondary | ICD-10-CM | POA: Diagnosis not present

## 2017-04-11 DIAGNOSIS — J300 Vasomotor rhinitis: Secondary | ICD-10-CM | POA: Diagnosis not present

## 2017-04-11 DIAGNOSIS — R42 Dizziness and giddiness: Secondary | ICD-10-CM | POA: Diagnosis not present

## 2017-04-12 ENCOUNTER — Ambulatory Visit
Admission: RE | Admit: 2017-04-12 | Discharge: 2017-04-12 | Disposition: A | Discharge status: Home / Self Care | Payer: 59 | Source: Ambulatory Visit | Attending: Obstetrics & Gynecology | Admitting: Obstetrics & Gynecology

## 2017-04-12 ENCOUNTER — Other Ambulatory Visit: Payer: Self-pay | Admitting: Obstetrics & Gynecology

## 2017-04-12 DIAGNOSIS — R928 Other abnormal and inconclusive findings on diagnostic imaging of breast: Secondary | ICD-10-CM

## 2017-04-12 DIAGNOSIS — R921 Mammographic calcification found on diagnostic imaging of breast: Secondary | ICD-10-CM | POA: Diagnosis not present

## 2017-04-12 DIAGNOSIS — C50411 Malignant neoplasm of upper-outer quadrant of right female breast: Secondary | ICD-10-CM | POA: Diagnosis not present

## 2017-04-12 DIAGNOSIS — D0511 Intraductal carcinoma in situ of right breast: Secondary | ICD-10-CM | POA: Diagnosis not present

## 2017-04-13 ENCOUNTER — Telehealth: Payer: Self-pay | Admitting: Hematology

## 2017-04-13 ENCOUNTER — Encounter: Payer: Self-pay | Admitting: *Deleted

## 2017-04-13 DIAGNOSIS — C50911 Malignant neoplasm of unspecified site of right female breast: Secondary | ICD-10-CM

## 2017-04-13 DIAGNOSIS — N950 Postmenopausal bleeding: Secondary | ICD-10-CM | POA: Diagnosis not present

## 2017-04-13 DIAGNOSIS — N840 Polyp of corpus uteri: Secondary | ICD-10-CM | POA: Diagnosis not present

## 2017-04-13 HISTORY — DX: Malignant neoplasm of unspecified site of right female breast: C50.911

## 2017-04-13 NOTE — Telephone Encounter (Signed)
Spoke with patient to confirm morning [facility] appointment for 04/19/17, will email paperwork to patient

## 2017-04-16 DIAGNOSIS — Z170 Estrogen receptor positive status [ER+]: Secondary | ICD-10-CM

## 2017-04-16 DIAGNOSIS — C50411 Malignant neoplasm of upper-outer quadrant of right female breast: Secondary | ICD-10-CM | POA: Insufficient documentation

## 2017-04-17 ENCOUNTER — Encounter: Payer: Self-pay | Admitting: Hematology

## 2017-04-17 NOTE — Progress Notes (Signed)
[place]  [phone] Fax:([phone] [place] Note   Patient Care Team: Briscoe Deutscher, MD as PCP - General (Family Medicine) 04/19/2017  CHIEF COMPLAINTS/PURPOSE OF CONSULTATION:  Malignant neoplasm of upper-outer quadrant of right breast, ER/PR +, HER2 -    Malignant neoplasm of upper-outer quadrant of right breast, estrogen receptor positive (Felicia Bauer)   04/07/2017 Mammogram    IMPRESSION: 1. Highly suspicious mass within the upper right breast, 12 to 12:30 o'clock axis, at posterior depth, with associated architectural distortion and pleomorphic calcifications. The mass measures 1 cm greatest dimension. The mass and associated microcalcifications measure 1.4 cm. No sonographic correlate identified. Recommend stereotactic biopsy using 3D tomosynthesis guidance. 2. Additional punctate and coarse heterogeneous calcifications within the upper outer quadrant of the right breast, 11-12 o'clock axis region, at middle depth, with a suspicious segmental distribution, spanning approximately 5 cm, the most suspicious calcifications best seen on the CC magnification views. Recommend additional stereotactic biopsy for these right breast calcifications to exclude multifocal/multicentric disease and/or define extent of disease.      04/12/2017 Initial Biopsy    Diagnosis 1. 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 - SEE COMMENT. 2. Breast, right, needle core biopsy, loosely grouped calcs in more central, upper outer quadrant - DUCTAL CARCINOMA IN SITU WITH CALCIFICATIONS. - LOBULAR NEOPLASIA (ATYPICAL LOBULAR HYPERPLASIA). - FIBROCYSTIC CHANGES WITH ADENOSIS AND CALCIFICATIONS.      04/12/2017 Receptors her2    Estrogen Receptor: 95%, POSITIVE, STRONG STAINING INTENSITY Progesterone Receptor: 95%, POSITIVE, STRONG  STAINING INTENSITY Proliferation Marker Ki67: 3% HER2: NEGATIVE      04/16/2017 Initial Diagnosis    Malignant neoplasm of upper-outer quadrant of right breast, estrogen receptor positive (Felicia Bauer)        HISTORY OF PRESENTING ILLNESS: 04/19/17 Felicia Bauer 56 y.o. female is here because of newly diagnosed right breast cancer. She presents to Breast Clinic today by herself and arrived with her son.   In the past she had spinal fusion twice in her cervical spine. She takes gabapentin and Cymbalta for her pain. She had tubal ligation, cesarean surgery and cervical ablations. She does have mitral valve prolapse. Her mother had colon cancer, diagnosed at 58 yo.   Today she reports her lump was found by screening mammogram which she gets yearly. She notes she has recent swelling in her legs, R>L. She denies any other recent change, SOB, or new pain. 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 She is seperated and has one son and one daughter. She still has periods.     MEDICAL HISTORY:  Past Medical History:  Diagnosis Date  . Chest pain, unspecified   . Disturbance of skin sensation   . Essential hypertension, benign   . GERD (gastroesophageal reflux disease)   . Hypertension   . Insomnia, unspecified   . Iron deficiency anemia secondary to blood loss (chronic)   . Mitral valve disorders(424.0)    MVP  . Obesity, unspecified   . Panic disorder   . Paresthesias 03/22/2013  . Shortness of breath   . Sleep disturbance, unspecified     SURGICAL HISTORY: Past Surgical History:  Procedure Laterality Date  . CERVICAL ABLATION  05/2009  . child birth x2  [phone]  . TUBAL LIGATION      SOCIAL HISTORY: Social History   Socioeconomic History  . Marital status: Legally Separated  Spouse name: Not on file  . Number of children: 2  . Years of education: Not on file  . Highest education level: Not on file  Social Needs  . Financial resource strain: Not on file  . Food  insecurity - worry: Not on file  . Food insecurity - inability: Not on file  . Transportation needs - medical: Not on file  . Transportation needs - non-medical: Not on file  Occupational History  . Not on file  Tobacco Use  . Smoking status: Never Smoker  . Smokeless tobacco: Never Used  Substance and Sexual Activity  . Alcohol use: No  . Drug use: No  . Sexual activity: Not on file  Other Topics Concern  . Not on file  Social History Narrative  . Not on file    FAMILY HISTORY: Family History  Problem Relation Age of Onset  . Cancer Mother 65       colon cancer     ALLERGIES:  has No Known Allergies.  MEDICATIONS:  Current Outpatient Medications  Medication Sig Dispense Refill  . aspirin EC 81 MG tablet May resume 5 days after your surgery on 10/31/2016    . celecoxib (CELEBREX) 200 MG capsule     . clonazePAM (KLONOPIN) 1 MG tablet Take 0.5 mg by mouth as needed for anxiety.    . diazepam (VALIUM) 2 MG tablet diazepam 2 mg tablet    . DULoxetine (CYMBALTA) 30 MG capsule duloxetine 30 mg capsule,delayed release  TAKE ONE CAPSULE BY MOUTH EVERY DAY    . fexofenadine-pseudoephedrine (ALLEGRA-D 24) 180-240 MG per 24 hr tablet Take 1 tablet by mouth daily as needed (for sinus).    . gabapentin (NEURONTIN) 600 MG tablet Take 1 tablet (600 mg total) by mouth 3 (three) times daily. 100 tablet 2  . hydrochlorothiazide (HYDRODIURIL) 25 MG tablet Take 25 mg by mouth daily.    Marland Kitchen HYDROcodone-acetaminophen (NORCO) 10-325 MG tablet Take by mouth.    . pantoprazole (PROTONIX) 40 MG tablet Take 40 mg daily by mouth.  2   No current facility-administered medications for this visit.     REVIEW OF SYSTEMS:   Constitutional: Denies fevers, chills or abnormal night sweats Eyes: Denies blurriness of vision, double vision or watery eyes Ears, nose, mouth, throat, and face: Denies mucositis or sore throat Respiratory: Denies cough, dyspnea or wheezes Cardiovascular: Denies palpitation,  chest discomfort (+0 bilateral lower extremity swelling, R>L Gastrointestinal:  Denies nausea, heartburn or change in bowel habits Skin: Denies abnormal skin rashes Lymphatics: Denies new lymphadenopathy or easy bruising Neurological:Denies numbness, tingling or new weaknesses Behavioral/Psych: Mood is stable, no new changes  All other systems were reviewed with the patient and are negative.  PHYSICAL EXAMINATION: ECOG PERFORMANCE STATUS: 0  Vitals:   04/19/17 0909  BP: (!) 159/73  Pulse: 72  Resp: 20  Temp: 98 F (36.7 C)  SpO2: 99%   Filed Weights   04/19/17 0909  Weight: 196 lb (88.9 kg)    GENERAL:alert, no distress and comfortable SKIN: skin color, texture, turgor are normal, no rashes or significant lesions EYES: normal, conjunctiva are pink and non-injected, sclera clear OROPHARYNX:no exudate, no erythema and lips, buccal mucosa, and tongue normal  NECK: supple, thyroid normal size, non-tender, without nodularity LYMPH:  no palpable lymphadenopathy in the cervical, axillary or inguinal LUNGS: clear to auscultation and percussion with normal breathing effort HEART: regular rate & rhythm and no murmurs and no lower extremity edema ABDOMEN:abdomen soft, non-tender and normal bowel  sounds Musculoskeletal:no cyanosis of digits and no clubbing  PSYCH: alert & oriented x 3 with fluent speech NEURO: no focal motor/sensory deficits BREAST: Breast inspection showed them to be symmetrical with no nipple discharge. Palpation of the breasts and axilla revealed no obvious mass that I could appreciate.   LABORATORY DATA:  I have reviewed the data as listed CBC Latest Ref Rng & Units 04/19/2017 05/16/2013 05/29/2008  WBC 3.9 - 10.3 10e3/uL 6.0 7.2 7.1  Hemoglobin 11.6 - 15.9 g/dL 13.1 9.9(L) 13.2  Hematocrit 34.8 - 46.6 % 41.1 32.4(L) 39.4  Platelets 145 - 400 10e3/uL 211 263 229    CMP Latest Ref Rng & Units 04/19/2017 05/16/2013 05/29/2008  Glucose 70 - 140 mg/dl 95 107(H) 99    BUN 7.0 - 26.0 mg/dL 17._0 Creatinine 0.6 - 1.1 mg/dL 0.8 0.70 0.67  Sodium 136 - 145 mEq/L 142 134(L) 134(L)  Potassium 3.5 - 5.1 mEq/L 3.7 3.9 3.7  Chloride 96 - 112 mEq/L - 98 98  CO2 22 - 29 mEq/L _1 Calcium 8.4 - 10.4 mg/dL 9.8 9.4 10.0  Total Protein 6.4 - 8.3 g/dL 7.6 - -  Total Bilirubin 0.20 - 1.20 mg/dL 0.47 - -  Alkaline Phos 40 - 150 U/L 58 - -  AST 5 - 34 U/L 17 - -  ALT 0 - 55 U/L 12 - -    PATHOLOGY  Diagnosis 04/12/17 1. Breast, right, needle core biopsy, lateral aspect of upper outer quadrant, mass with calcs - INVASIVE DUCTAL CARCINOMA. - DUCTAL CARCINOMA IN SITU WITH CALCIFICATIONS. - SEE COMMENT. 2. Breast, right, needle core biopsy, loosely grouped calcs in more central, upper outer quadrant - DUCTAL CARCINOMA IN SITU WITH CALCIFICATIONS. - LOBULAR NEOPLASIA (ATYPICAL LOBULAR HYPERPLASIA). - FIBROCYSTIC CHANGES WITH ADENOSIS AND CALCIFICATIONS. - SEE COMMENT. 1 of 3 FINAL for Felicia Bauer, Felicia Bauer ([id number]) Microscopic Comment 1. The carcinoma appears grade I. A breast prognostic profile will be performed and the results reported separately. The results were called to the [facility] on 04/13/17. 2. The carcinoma is low grade. Estrogen receptor and progesterone receptor studies will be performed and the results reported separately. The results were called to the [facility] on 04/13/17. (JBK:kh 04/13/17) 1. FLUORESCENCE IN-SITU HYBRIDIZATION Results: HER2 - NEGATIVE RATIO OF HER2/CEP17 SIGNALS 1.28 AVERAGE HER2 COPY NUMBER PER CELL 2.50 Results: IMMUNOHISTOCHEMICAL AND MORPHOMETRIC ANALYSIS PERFORMED MANUALLY Estrogen Receptor: 95%, POSITIVE, STRONG STAINING INTENSITY Progesterone Receptor: 95%, POSITIVE, STRONG STAINING INTENSITY Proliferation Marker Ki67: 3%    RADIOGRAPHIC STUDIES: I have personally reviewed the radiological images as listed and agreed with the findings in the report. US Breast Ltd Uni  Right Inc Axilla  Result Date: 04/07/2017 CLINICAL DATA:  Patient returns today to evaluate calcifications and a possible mass in the right breast identified on recent screening mammogram. EXAM: 2D DIGITAL DIAGNOSTIC RIGHT MAMMOGRAM WITH CAD AND ADJUNCT TOMO ULTRASOUND RIGHT BREAST COMPARISON:  Previous exams including recent screening mammogram dated 03/29/2017. ACR Breast Density Category c: The breast tissue is heterogeneously dense, which may obscure small masses. FINDINGS: On today's additional views with spot compression and 3D tomosynthesis, an irregular mass is confirmed in the upper right breast, 12 o'clock axis region, at posterior depth, with associated architectural distortion and pleomorphic calcifications. The mass measures 1 cm greatest dimension. The mass and associated microcalcifications measure 1.4 cm. There are additional punctate and coarse heterogeneous calcifications in the upper right breast, 11-12 o'clock axis region, at middle depth,  spanning approximately 5 cm as measured on a CC magnification view, most suspicious calcifications are best seen on the CC magnification views. Mammographic images were processed with CAD. Targeted ultrasound is performed, showing no sonographic correlate for the right breast mass. Ultrasound was also performed of the upper-outer quadrant of the right breast corresponding to the area of calcifications showing only normal dense fibroglandular tissues and no suspicious solid or cystic masses. Right axilla was evaluated with ultrasound showing no enlarged or morphologically abnormal lymph nodes. IMPRESSION: 1. Highly suspicious mass within the upper right breast, 12 to 12:30 o'clock axis, at posterior depth, with associated architectural distortion and pleomorphic calcifications. The mass measures 1 cm greatest dimension. The mass and associated microcalcifications measure 1.4 cm. No sonographic correlate identified. Recommend stereotactic biopsy using 3D  tomosynthesis guidance. 2. Additional punctate and coarse heterogeneous calcifications within the upper outer quadrant of the right breast, 11-12 o'clock axis region, at middle depth, with a suspicious segmental distribution, spanning approximately 5 cm, the most suspicious calcifications best seen on the CC magnification views. Recommend additional stereotactic biopsy for these right breast calcifications to exclude multifocal/multicentric disease and/or define extent of disease. RECOMMENDATION: 1. Stereotactic biopsy, with 3D tomosynthesis guidance, for the highly suspicious mass in the upper right breast, 12 to 12:30 o'clock axis, at posterior depth, with associated architecture distortion and pleomorphic calcifications. 2. Stereotactic biopsy for the additional suspicious calcifications in the upper outer quadrant of the right breast. Stereotactic biopsies are scheduled for October 31st at 1:45 p.m. I have discussed the findings and recommendations with the patient. Results were also provided in writing at the conclusion of the visit. If applicable, a reminder letter will be sent to the patient regarding the next appointment. BI-RADS CATEGORY  5: Highly suggestive of malignancy. Electronically Signed   By: Franki Cabot M.D.   On: 04/07/2017 11:12   Mm Diag Breast Tomo Uni Right  Result Date: 04/07/2017 CLINICAL DATA:  Patient returns today to evaluate calcifications and a possible mass in the right breast identified on recent screening mammogram. EXAM: 2D DIGITAL DIAGNOSTIC RIGHT MAMMOGRAM WITH CAD AND ADJUNCT TOMO ULTRASOUND RIGHT BREAST COMPARISON:  Previous exams including recent screening mammogram dated 03/29/2017. ACR Breast Density Category c: The breast tissue is heterogeneously dense, which may obscure small masses. FINDINGS: On today's additional views with spot compression and 3D tomosynthesis, an irregular mass is confirmed in the upper right breast, 12 o'clock axis region, at posterior depth,  with associated architectural distortion and pleomorphic calcifications. The mass measures 1 cm greatest dimension. The mass and associated microcalcifications measure 1.4 cm. There are additional punctate and coarse heterogeneous calcifications in the upper right breast, 11-12 o'clock axis region, at middle depth, spanning approximately 5 cm as measured on a CC magnification view, most suspicious calcifications are best seen on the CC magnification views. Mammographic images were processed with CAD. Targeted ultrasound is performed, showing no sonographic correlate for the right breast mass. Ultrasound was also performed of the upper-outer quadrant of the right breast corresponding to the area of calcifications showing only normal dense fibroglandular tissues and no suspicious solid or cystic masses. Right axilla was evaluated with ultrasound showing no enlarged or morphologically abnormal lymph nodes. IMPRESSION: 1. Highly suspicious mass within the upper right breast, 12 to 12:30 o'clock axis, at posterior depth, with associated architectural distortion and pleomorphic calcifications. The mass measures 1 cm greatest dimension. The mass and associated microcalcifications measure 1.4 cm. No sonographic correlate identified. Recommend stereotactic biopsy using 3D tomosynthesis guidance.  2. Additional punctate and coarse heterogeneous calcifications within the upper outer quadrant of the right breast, 11-12 o'clock axis region, at middle depth, with a suspicious segmental distribution, spanning approximately 5 cm, the most suspicious calcifications best seen on the CC magnification views. Recommend additional stereotactic biopsy for these right breast calcifications to exclude multifocal/multicentric disease and/or define extent of disease. RECOMMENDATION: 1. Stereotactic biopsy, with 3D tomosynthesis guidance, for the highly suspicious mass in the upper right breast, 12 to 12:30 o'clock axis, at posterior depth,  with associated architecture distortion and pleomorphic calcifications. 2. Stereotactic biopsy for the additional suspicious calcifications in the upper outer quadrant of the right breast. Stereotactic biopsies are scheduled for October 31st at 1:45 p.m. I have discussed the findings and recommendations with the patient. Results were also provided in writing at the conclusion of the visit. If applicable, a reminder letter will be sent to the patient regarding the next appointment. BI-RADS CATEGORY  5: Highly suggestive of malignancy. Electronically Signed   By: Franki Cabot M.D.   On: 04/07/2017 11:12   Mm Clip Placement Right  Result Date: 04/12/2017 CLINICAL DATA:  Status post stereotactic core needle biopsy of 2 right breast lesions. EXAM: DIAGNOSTIC RIGHT MAMMOGRAM POST STEREOTACTIC BIOPSY COMPARISON:  Previous exam(s). FINDINGS: Mammographic images were obtained following stereotactic guided biopsy of 2 right breast lesions. Lesion 1, marked with an X shaped clip, lies in the far posterior, lateral aspect of the upper outer quadrant. Lesion 2 corresponds to the more centrally located loosely grouped calcifications also the upper-outer quadrant, marked with a coil shaped biopsy clip. Both clips are well-positioned. IMPRESSION: X shaped biopsy clip, lesion 1, and coil shaped biopsy clip, lesion 2, both well-positioned in the right breast following stereotactic core needle biopsy. Final Assessment: Post Procedure Mammograms for Marker Placement Electronically Signed   By: Lajean Manes M.D.   On: 04/12/2017 15:10   Mm Rt Breast Bx W Loc Dev 1st Lesion Image Bx Spec Stereo Guide  Addendum Date: 04/13/2017   ADDENDUM REPORT: 04/13/2017 10:24 ADDENDUM: Pathology revealed GRADE I INVASIVE DUCTAL CARCINOMA, DUCTAL CARCINOMA IN SITU WITH CALCIFICATIONS of the Right breast, lateral aspect of upper outer quadrant. LOW GRADE DUCTAL CARCINOMA IN SITU WITH CALCIFICATIONS, LOBULAR NEOPLASIA (ATYPICAL LOBULAR  HYPERPLASIA), FIBROCYSTIC CHANGES WITH ADENOSIS AND CALCIFICATIONS of the Right breast, central, upper outer quadrant. This was found to be concordant by Dr. Lajean Manes. Pathology results were discussed with the patient by telephone. The patient reported doing well after the biopsies with tenderness at the sites. Post biopsy instructions and care were reviewed and questions were answered. The patient was encouraged to call The [facility] for any additional concerns. The patient was referred to [facility] on April 19, 2017. Pathology results reported by Terie Purser, RN on 04/13/2017. Electronically Signed   By: Lajean Manes M.D.   On: 04/13/2017 10:24   Result Date: 04/13/2017 CLINICAL DATA:  The patient presents stereotactic core needle biopsy of a small spiculated right breast mass with associated calcifications as well as additional area of more loosely grouped calcifications. EXAM: RIGHT BREAST STEREOTACTIC CORE NEEDLE BIOPSY COMPARISON:  Previous exams. FINDINGS: The patient and I discussed the procedure of stereotactic-guided biopsy including benefits and alternatives. We discussed the high likelihood of a successful procedure. We discussed the risks of the procedure including infection, bleeding, tissue injury, clip migration, and inadequate sampling. Informed written consent was given. The usual time out  protocol was performed immediately prior to the procedure. Using sterile technique and 1% Lidocaine as local anesthetic, under stereotactic guidance, a 9 gauge vacuum assisted device was used to perform core needle biopsy of the small spiculated mass and associated calcifications in the posterolateral aspect of the upper-outer quadrant using a superior approach. Specimen radiograph was performed showing multiple calcifications for which biopsy was performed. Specimens with calcifications are identified  for pathology. Lesion quadrant: Upper outer quadrant Using sterile technique and 1% Lidocaine as local anesthetic, under stereotactic guidance, a 9 gauge vacuum assisted device was used to perform core needle biopsy of calcifications in the more central aspect of the upper-outer quadrant using a superior approach. Specimen radiograph was performed showing multiple calcifications for which biopsy was performed. Specimens with calcifications are identified for pathology. Lesion quadrant: Upper outer quadrant At the conclusion of the procedure, an X shaped tissue marker clip was deployed into the posterior in the more lateral biopsy cavity and a coil shaped tissue marker clip was deployed into the more central upper outer quadrant biopsy cavity. Follow-up 2-view mammogram was performed and dictated separately. IMPRESSION: Stereotactic-guided biopsy of 2 lesions in the right breast. No apparent complications. Electronically Signed: By: Lajean Manes M.D. On: 04/12/2017 14:54   Mm Rt Breast Bx W Loc Dev Ea Ad Lesion Img Bx Spec Stereo Guide  Addendum Date: 04/13/2017   ADDENDUM REPORT: 04/13/2017 10:24 ADDENDUM: Pathology revealed GRADE I INVASIVE DUCTAL CARCINOMA, DUCTAL CARCINOMA IN SITU WITH CALCIFICATIONS of the Right breast, lateral aspect of upper outer quadrant. LOW GRADE DUCTAL CARCINOMA IN SITU WITH CALCIFICATIONS, LOBULAR NEOPLASIA (ATYPICAL LOBULAR HYPERPLASIA), FIBROCYSTIC CHANGES WITH ADENOSIS AND CALCIFICATIONS of the Right breast, central, upper outer quadrant. This was found to be concordant by Dr. Lajean Manes. Pathology results were discussed with the patient by telephone. The patient reported doing well after the biopsies with tenderness at the sites. Post biopsy instructions and care were reviewed and questions were answered. The patient was encouraged to call [facility] for any additional concerns. The patient was referred to [facility] on April 19, 2017. Pathology results reported by Terie Purser, RN on 04/13/2017. Electronically Signed   By: Lajean Manes M.D.   On: 04/13/2017 10:24   Result Date: 04/13/2017 CLINICAL DATA:  The patient presents stereotactic core needle biopsy of a small spiculated right breast mass with associated calcifications as well as additional area of more loosely grouped calcifications. EXAM: RIGHT BREAST STEREOTACTIC CORE NEEDLE BIOPSY COMPARISON:  Previous exams. FINDINGS: The patient and I discussed the procedure of stereotactic-guided biopsy including benefits and alternatives. We discussed the high likelihood of a successful procedure. We discussed the risks of the procedure including infection, bleeding, tissue injury, clip migration, and inadequate sampling. Informed written consent was given. The usual time out protocol was performed immediately prior to the procedure. Using sterile technique and 1% Lidocaine as local anesthetic, under stereotactic guidance, a 9 gauge vacuum assisted device was used to perform core needle biopsy of the small spiculated mass and associated calcifications in the posterolateral aspect of the upper-outer quadrant using a superior approach. Specimen radiograph was performed showing multiple calcifications for which biopsy was performed. Specimens with calcifications are identified for pathology. Lesion quadrant: Upper outer quadrant Using sterile technique and 1% Lidocaine as local anesthetic, under stereotactic guidance, a 9 gauge vacuum assisted device was used to perform core needle biopsy of calcifications in the more central  aspect of the upper-outer quadrant using a superior approach. Specimen radiograph was performed showing multiple calcifications for which biopsy was performed. Specimens with calcifications are identified for pathology. Lesion quadrant: Upper outer quadrant At the conclusion of the procedure, an X shaped tissue  marker clip was deployed into the posterior in the more lateral biopsy cavity and a coil shaped tissue marker clip was deployed into the more central upper outer quadrant biopsy cavity. Follow-up 2-view mammogram was performed and dictated separately. IMPRESSION: Stereotactic-guided biopsy of 2 lesions in the right breast. No apparent complications. Electronically Signed: By: Lajean Manes M.D. On: 04/12/2017 14:54    ASSESSMENT & PLAN:  Felicia Bauer is a 56 y.o. premenopausal African-American female with a history of HTN an d mitral valve disorder.    1. Malignant neoplasm of upper-outer quadrant of right breast, invasive ductal carcinoma, stage IA, cT1cN0M0, ER+/PR +, HER2 -, Grade I, and DCIS ER+/PR+ -We discussed her imaging findings and the biopsy results in great details. She has two area of primarily DCIS and one with invasive components.  -she was seen by Dr. Barry Dienes today, double lumpectomy versus mastectomy was discussed with patient.  Dr. Barry Dienes recommend a breast MRI for further evaluation, and likely will proceed with surgery (likely lumpectomy and sentinel lymph node biopsy) soon.  -I recommend a Oncotype Dx test on the surgical invasive tumor sample and we'll make a decision about adjuvant chemotherapy based on the Oncotype result. Written material of this test was given to her. She is young and fit, would be a good candidate for chemotherapy if her Oncotype recurrence score is high. -If her surgical sentinel lymph node positive, I recommend mammaprint for further risk stratification and guide adjuvant chemotherapy. -Giving the strong ER and PR expression in her premenopausal status, I recommend adjuvant endocrine therapy with Tamoxifen for a total of 5-10 years to reduce the risk of cancer recurrence. Potential benefits and side effects were discussed with patient and she is interested. -The potential side effects, which includes but not limited to, hot flash, skin and vaginal  dryness, slightly increased risk of cardiovascular disease and cataract, small risk of thrombosis and endometrial cancer, were discussed with her in great details. Preventive strategies for thrombosis, such as being physically active, using compression stocks, avoid cigarette smoking, etc., were reviewed with her. I also recommend her to follow-up with her gynecologist once a year, and watch for vaginal spotting or bleeding, as a clinically sign of endometrial cancer, etc. She voiced good understanding, and agrees to proceed. Will start after she completes adjuvant breast radiation. -She was also seen by radiation oncologist Dr. Lisbeth Renshaw today.  Adjuvant breast radiation after lumpectomy was discussed and offered to her. -We also discussed the breast cancer surveillance after her surgery. She will continue annual screening mammogram, self exam, and a routine office visit with lab and exam with [place]. -I encouraged her to have healthy diet and exercise regularly.  -She will need breast MRI before proceeding with surgery.  -I will see her after her radiation or surgery if her Oncotype returns high risk.  -Today's labs CBC and CMP were normal, reviewed with patient.  2.  Hypertension, GERD, anxiety -Follow-up with primary care physician  3. History of anemia  -Resolved now, her CBC was normal today.  PLAN:  -Bilateral breast MRI with and without contrast  -She will proceed left breast surgery with Dr. Cooper Render  -Oncotype if invasive tumor if more than 1 cm and node negative, or mammaprint if  note positive.  I will see her after her radiation or sooner if her Oncotype returns high risk.     No orders of the defined types were placed in this encounter.   All questions were answered. The patient knows to call the clinic with any problems, questions or concerns. I spent 55 minutes counseling the patient face to face. The total time spent in the appointment was 60 minutes and more than 50% was on  counseling.     Truitt Merle, MD 04/19/2017 5:03 PM   This document serves as a record of services personally performed by Truitt Merle, MD. It was created on her behalf by Joslyn Devon, a trained medical scribe. The creation of this record is based on the scribe's personal observations and the provider's statements to them.    I have reviewed the above documentation for accuracy and completeness, and I agree with the above.

## 2017-04-18 ENCOUNTER — Other Ambulatory Visit: Payer: Self-pay | Admitting: *Deleted

## 2017-04-18 DIAGNOSIS — Z170 Estrogen receptor positive status [ER+]: Principal | ICD-10-CM

## 2017-04-18 DIAGNOSIS — C50411 Malignant neoplasm of upper-outer quadrant of right female breast: Secondary | ICD-10-CM

## 2017-04-19 ENCOUNTER — Encounter: Payer: Self-pay | Admitting: Hematology

## 2017-04-19 ENCOUNTER — Encounter: Payer: Self-pay | Admitting: Physical Therapy

## 2017-04-19 ENCOUNTER — Other Ambulatory Visit (HOSPITAL_BASED_OUTPATIENT_CLINIC_OR_DEPARTMENT_OTHER): Payer: 59

## 2017-04-19 ENCOUNTER — Other Ambulatory Visit: Payer: Self-pay | Admitting: *Deleted

## 2017-04-19 ENCOUNTER — Ambulatory Visit
Admission: RE | Admit: 2017-04-19 | Discharge: 2017-04-19 | Disposition: A | Discharge status: Home / Self Care | Payer: 59 | Source: Ambulatory Visit | Attending: Radiation Oncology | Admitting: Radiation Oncology

## 2017-04-19 ENCOUNTER — Ambulatory Visit: Payer: 59 | Admitting: Hematology

## 2017-04-19 ENCOUNTER — Ambulatory Visit: Payer: 59 | Attending: General Surgery | Admitting: Physical Therapy

## 2017-04-19 DIAGNOSIS — D0511 Intraductal carcinoma in situ of right breast: Secondary | ICD-10-CM | POA: Diagnosis not present

## 2017-04-19 DIAGNOSIS — C50411 Malignant neoplasm of upper-outer quadrant of right female breast: Secondary | ICD-10-CM

## 2017-04-19 DIAGNOSIS — M542 Cervicalgia: Secondary | ICD-10-CM

## 2017-04-19 DIAGNOSIS — R293 Abnormal posture: Secondary | ICD-10-CM | POA: Diagnosis not present

## 2017-04-19 DIAGNOSIS — M6281 Muscle weakness (generalized): Secondary | ICD-10-CM | POA: Diagnosis present

## 2017-04-19 DIAGNOSIS — Z170 Estrogen receptor positive status [ER+]: Principal | ICD-10-CM

## 2017-04-19 DIAGNOSIS — I10 Essential (primary) hypertension: Secondary | ICD-10-CM | POA: Insufficient documentation

## 2017-04-19 DIAGNOSIS — Z510 Encounter for antineoplastic radiation therapy: Secondary | ICD-10-CM | POA: Insufficient documentation

## 2017-04-19 DIAGNOSIS — K219 Gastro-esophageal reflux disease without esophagitis: Secondary | ICD-10-CM | POA: Insufficient documentation

## 2017-04-19 LAB — CBC WITH DIFFERENTIAL/PLATELET
BASO%: 0.3 % (ref 0.0–2.0)
BASOS ABS: 0 10*3/uL (ref 0.0–0.1)
EOS ABS: 0.2 10*3/uL (ref 0.0–0.5)
EOS%: 2.8 % (ref 0.0–7.0)
HCT: 41.1 % (ref 34.8–46.6)
HGB: 13.1 g/dL (ref 11.6–15.9)
LYMPH#: 2.4 10*3/uL (ref 0.9–3.3)
LYMPH%: 39.9 % (ref 14.0–49.7)
MCH: 27.2 pg (ref 25.1–34.0)
MCHC: 31.9 g/dL (ref 31.5–36.0)
MCV: 85.3 fL (ref 79.5–101.0)
MONO#: 0.6 10*3/uL (ref 0.1–0.9)
MONO%: 9.9 % (ref 0.0–14.0)
NEUT#: 2.8 10*3/uL (ref 1.5–6.5)
NEUT%: 47.1 % (ref 38.4–76.8)
Platelets: 211 10*3/uL (ref 145–400)
RBC: 4.82 10*6/uL (ref 3.70–5.45)
RDW: 14.4 % (ref 11.2–14.5)
WBC: 6 10*3/uL (ref 3.9–10.3)

## 2017-04-19 LAB — COMPREHENSIVE METABOLIC PANEL
ALBUMIN: 4.2 g/dL (ref 3.5–5.0)
ALT: 12 U/L (ref 0–55)
AST: 17 U/L (ref 5–34)
Alkaline Phosphatase: 58 U/L (ref 40–150)
Anion Gap: 10 mEq/L (ref 3–11)
BUN: 17.2 mg/dL (ref 7.0–26.0)
CO2: 28 mEq/L (ref 22–29)
Calcium: 9.8 mg/dL (ref 8.4–10.4)
Chloride: 103 mEq/L (ref 98–109)
Creatinine: 0.8 mg/dL (ref 0.6–1.1)
EGFR: >60 mL/min/{1.73_m2} (ref >60)
GLUCOSE: 95 mg/dL (ref 70–140)
Potassium: 3.7 mEq/L (ref 3.5–5.1)
SODIUM: 142 meq/L (ref 136–145)
Total Bilirubin: 0.47 mg/dL (ref 0.20–1.20)
Total Protein: 7.6 g/dL (ref 6.4–8.3)

## 2017-04-19 NOTE — Therapy (Signed)
[place], [place], [address] Phone: [phone]   Fax:  [phone]  Physical Therapy Evaluation  Patient Details  Name: Felicia Bauer MRN: [id number] Date of Birth: 1960-09-15 Referring Provider: Dr. [name]   Encounter Date: 04/19/2017  PT End of Session - 04/19/17 1919    Visit Number  1    Number of Visits  10 Eval, 2x/wk for 4 weeks for neck; 1 f/u with cancer rehab post operatively   Eval, 2x/wk for 4 weeks for neck; 1 f/u with cancer rehab post operatively   Date for PT Re-Evaluation  06/14/17    PT Start Time  1056    PT Stop Time  1121    PT Time Calculation (min)  25 min    Activity Tolerance  Patient tolerated treatment well    Behavior During Therapy  [facility] for tasks assessed/performed       Past Medical History:  Diagnosis Date  . Chest pain, unspecified   . Disturbance of skin sensation   . Essential hypertension, benign   . GERD (gastroesophageal reflux disease)   . Hypertension   . Insomnia, unspecified   . Iron deficiency anemia secondary to blood loss (chronic)   . Mitral valve disorders(424.0)    MVP  . Obesity, unspecified   . Panic disorder   . Paresthesias 03/22/2013  . Shortness of breath   . Sleep disturbance, unspecified     Past Surgical History:  Procedure Laterality Date  . CERVICAL ABLATION  05/2009  . child birth x2  [phone]  . TUBAL LIGATION      There were no vitals filed for this visit.   Subjective Assessment - 04/19/17 1441    Subjective  Patient reports she is at the breast clinic today to meet with her medical team for her newly diagnosed right breast cancer. She also reports she had a cervical fusion in 5/18 and did not have PT afterwards. She reports continued stiffness and pian in her neck and reports being overall deconditioned as a result of that surgery.    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 She has a 1 cm mass located in the upper inner quadrant and a 5 cm area of DCIS calcifications in the upper outer quadrant. It is ER/PR positive, HER2 negative with a Ki67 of 3%. Her cervical fusion was in 5/18 and she had a previous cervical fusion at a lower level in 2014. The patient is unclear of the exact levels that were fused.    Patient Stated Goals  Improve neck stiffness and pain; learn post op shoulder ROM HEP for after breast surgery, and reduce lymphedema risk.    Currently in Pain?  Yes    Pain Score  3     Pain Location  Neck    Pain Orientation  Right;Left    Pain Descriptors / Indicators  Aching    Pain Type  Chronic pain    Pain Onset  More than a month ago    Pain Frequency  Intermittent    Aggravating Factors   End of the day while working    Pain Relieving Factors  Rest    Multiple Pain Sites  No         [facility] PT Assessment - 04/19/17 0001      Assessment   Medical Diagnosis  Right breast cancer and s/p cervical fusion  Referring Provider  Dr. [name]    Onset Date/Surgical Date  03/29/17    Hand Dominance  Right    Prior Therapy  none      Precautions   Precautions  Other (comment)    Precaution Comments  active cancer      Restrictions   Weight Bearing Restrictions  No      Balance Screen   Has the patient fallen in the past 6 months  No    Has the patient had a decrease in activity level because of a fear of falling?   No    Is the patient reluctant to leave their home because of a fear of falling?   No      Home Environment   Living Environment  Private residence    Living Arrangements  Children 18 y.o. son   3 y.o. 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    Leisure  She has not exercised since her cervical fusion in May  2018      Cognition   Overall Cognitive Status  Within Functional Limits for tasks assessed      Posture/Postural Control   Posture/Postural Control  Postural limitations    Postural Limitations  Rounded Shoulders;Forward head      ROM / Strength   AROM / PROM / Strength  AROM;Strength      AROM   Overall AROM Comments  AROM measurements taken in standing    AROM Assessment Site  Shoulder;Cervical    Right/Left Shoulder  Right;Left    Right Shoulder Extension  39 Degrees    Right Shoulder Flexion  150 Degrees    Right Shoulder ABduction  153 Degrees    Right Shoulder Internal Rotation  62 Degrees    Right Shoulder External Rotation  83 Degrees    Left Shoulder Extension  40 Degrees    Left Shoulder Flexion  145 Degrees    Left Shoulder ABduction  158 Degrees    Left Shoulder Internal Rotation  67 Degrees    Left Shoulder External Rotation  87 Degrees    Cervical Flexion  WNL    Cervical Extension  25% limited    Cervical - Right Side Bend  WNL    Cervical - Left Side Bend  WNL    Cervical - Right Rotation  25% limited    Cervical - Left Rotation  25% limited      Strength   Overall Strength  Within functional limits for tasks performed    Overall Strength Comments  BLE are 5/5; unable to test grip strength with dynamometer as we did not have one at breast clinic.      Palpation   Palpation comment  Palpable tightness and tenderness present bilateral upper traps and cervical paraspinals        LYMPHEDEMA/ONCOLOGY QUESTIONNAIRE - 04/19/17 1915      Type   Cancer Type  Right breast cancer      Lymphedema Assessments   Lymphedema Assessments  Upper extremities      Right Upper Extremity Lymphedema   10 cm Proximal to Olecranon Process  33.7 cm    Olecranon Process  26.9 cm    10 cm Proximal to Ulnar Styloid Process  25.9 cm  Just Proximal to Ulnar Styloid Process  17.5 cm    Across Hand at [employer]  18.9 cm    At [place] of 2nd Digit  6.8 cm      Left  Upper Extremity Lymphedema   10 cm Proximal to Olecranon Process  33.1 cm    Olecranon Process  26.9 cm    10 cm Proximal to Ulnar Styloid Process  24.5 cm    Just Proximal to Ulnar Styloid Process  17.9 cm    Across Hand at [employer]  19 cm    At [place] of 2nd Digit  6.5 cm          Objective measurements completed on examination: See above findings.          Patient was instructed today in a home exercise program today for post op shoulder range of motion. These included active assist shoulder flexion in sitting, scapular retraction, wall walking with shoulder abduction, and hands behind head external rotation.  She was encouraged to do these twice a day, holding 3 seconds and repeating 5 times when permitted by her physician.        PT Education - 04/19/17 1918    Education provided  Yes    Education Details  Lymphedema risk reduction and post op shoulder ROM HEP    Person(s) Educated  Patient    Methods  Explanation;Demonstration;Handout    Comprehension  Returned demonstration;Verbalized understanding        Short Term Clinic Goals - 04/19/17 1929      CC Short Term Goal  #1   Title  Patient will report >/= 25% less pain at the end of her day to tolerate work with less difficulty.    Time  4    Period  Weeks    Status  New      CC Short Term Goal  #2   Title  Increase cervical rotation to be limited </= 10% to turn head while driving with less difficulty.    Time  4    Period  Weeks    Status  New      CC Short Term Goal  #3   Title  Report she has regained endurance and strength and is able to walk >/= 30 minutes without increased neck pain to improve cancer related outcomes and reduce recurrence risk.    Time  4    Period  Weeks      CC Short Term Goal  #4   Title  Demonstrate proper technique with home exercise program related to cervical range of motion and posture.    Time  4    Period  Weeks    Status  New        Breast Clinic Goals -  04/19/17 1929      Patient will be able to verbalize understanding of pertinent lymphedema risk reduction practices relevant to her diagnosis specifically related to skin care.   Time  1    Period  Days    Status  Achieved      Patient will be able to return demonstrate and/or verbalize understanding of the post-op home exercise program related to regaining shoulder range of motion.   Time  1    Period  Days    Status  Achieved      Patient will be able to verbalize understanding of the importance of attending the postoperative After Breast Cancer Class for further lymphedema risk reduction education  and therapeutic exercise.   Time  1    Period  Days    Status  Achieved       Long Term Clinic Goals - 04/19/17 1932      CC Long Term Goal  #1   Title  Patient will demonstrate she has regained shoulder function and ROM has returned to baseline following breast surgery.    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 She has a 1 cm mass located in the upper inner quadrant and a 5 cm area of DCIS calcifications in the upper outer quadrant. It is ER/PR positive, HER2 negative with a Ki67 of 3%. Her cervical fusion was in 5/18 and she had a previous cervical fusion at a lower level in 2014. The patient is unclear of the exact levels that were fused. Her multidisciplinary medical team met prior to her assessments to determine a recommended treatment plan. She is planning to have a right lumpectomy and sentinel node biopsy followed by Oncotype testing, radiation, and anti-estrogen therapy. She will benefit from PT pre-operatively to improve cervical range of motion and reduce tightness and pain. She will be seen for 1 f/u visit post operatively at Cancer Rehab to reassess and determine PT needs at that time prior to radiation.    History  and Personal Factors relevant to plan of care:  LImited support; lives with 54 y.o. son. Recent cancer diagnosis    Clinical Presentation  Evolving    Clinical Presentation due to:  Care plan for cervical spine may need to change due to upcoming cancer treatment. Unknown surgical outcomes.    Clinical Decision Making  Moderate    Clinical Impairments Affecting Rehab Potential  Active cancer    PT Frequency  2x / week    PT Duration  4 weeks Followed by 1 f/u visit at cancer rehab   Followed by 1 f/u visit at cancer rehab   PT Treatment/Interventions  ADLs/Self Care Home Management;Cryotherapy;Moist Heat;Iontophoresis 69m/ml Dexamethasone;Therapeutic activities;Therapeutic exercise;Patient/family education;Manual techniques;Scar mobilization;Passive range of motion;Dry needling    PT Next Visit Plan  Test grip strength with dynamometer; issue HEP for cervical ROM / scapular retraction; postural exercises and education; soft tissue work bilateral upper traps    PT Home Exercise Plan  Issued post op shoulder ROM HEP    Consulted and Agree with Plan of Care  Patient       Patient will benefit from skilled therapeutic intervention in order to improve the following deficits and impairments:  Postural dysfunction, Pain, Increased fascial restricitons, Decreased scar mobility, Impaired UE functional use, Decreased range of motion, Decreased activity tolerance, Decreased endurance, Decreased knowledge of precautions  Visit Diagnosis: Malignant neoplasm of upper-outer quadrant of right breast in female, estrogen receptor positive ([place] - Plan: PT plan of care cert/re-cert  Abnormal posture - Plan: PT plan of care cert/re-cert  Muscle weakness (generalized) - Plan: PT plan of care cert/re-cert  Cervicalgia - Plan: PT plan of care cert/re-cert   Patient will follow up at outpatient cancer rehab 3-4 weeks following surgery.  If the patient requires physical therapy at that time, a specific plan will be  dictated and sent to the referring physician for approval. The patient was educated today on appropriate basic range of  motion exercises to begin post operatively and the importance of attending the After Breast Cancer class following surgery.  Patient was educated today on lymphedema risk reduction practices as it pertains to recommendations that will benefit the patient immediately following surgery.  She verbalized good understanding.      Problem List Patient Active Problem List   Diagnosis Date Noted  . Malignant neoplasm of upper-outer quadrant of right breast, estrogen receptor positive ([place]) 04/16/2017  . Cervical myelopathy ([place]) 05/24/2013  . Paresthesias 03/22/2013    Annia Friendly, PT 04/19/17 7:38 PM   [place], [place], [address] Phone: [phone]   Fax:  [phone]  Name: TARIYA MORRISSETTE MRN: [id number] Date of Birth: [birth year]

## 2017-04-19 NOTE — Progress Notes (Addendum)
Radiation Oncology         [phone] ________________________________  Name: Felicia Bauer        MRN: [id number]  Date of Service: 04/19/2017 DOB: 07-22-1960  VO:HYWVP, Herbie [place], MD  [name], MD     REFERRING PHYSICIAN: [name], MD   DIAGNOSIS: The encounter diagnosis was Malignant neoplasm of upper-outer quadrant of right breast in female, estrogen receptor positive ([place]).   HISTORY OF PRESENT ILLNESS: Felicia Bauer is a 56 y.o. female seen in the multidisciplinary breast clinic for a new diagnosis of right  breast cancer. The patient was noted to have a screening detected mass with calcifications. She underwent diagnostic imaging on 04/07/17 which revealed a 1 cm mass with a span of 1.4 cm calcifications at 12-12:30. And from 11-12:00 there is a 5 cm area of calcifications. The right axilla was negative for adenopathy. A stereotactic biopsy of both revealed a grade 1, invasive ductal carcinoma, ER/PR positive, HER2 negative with a Ki 67 of 3%. 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 She comes today to discuss options of treatment for her cancer.    PREVIOUS RADIATION THERAPY: No   PAST MEDICAL HISTORY:  Past Medical History:  Diagnosis Date  . Chest pain, unspecified   . Disturbance of skin sensation   . Essential hypertension, benign   . GERD (gastroesophageal reflux disease)   . Hypertension   . Insomnia, unspecified   . Iron deficiency anemia secondary to blood loss (chronic)   . Mitral valve disorders(424.0)    MVP  . Obesity, unspecified   . Panic disorder   . Paresthesias 03/22/2013  . Shortness of breath   . Sleep disturbance, unspecified        PAST SURGICAL HISTORY: Past Surgical History:  Procedure Laterality Date  . CERVICAL ABLATION  05/2009  . child birth x2  [phone]  . TUBAL LIGATION       FAMILY HISTORY:  Family History  Problem Relation Age of Onset  . Cancer Mother 25       colon cancer        SOCIAL HISTORY:  reports that  has never smoked. she has never used smokeless tobacco. She reports that she does not drink alcohol or use drugs. The patient is married but separated and lives in [place]. She works for [employer].    ALLERGIES: Patient has no known allergies.   MEDICATIONS:  Current Outpatient Medications  Medication Sig Dispense Refill  . aspirin EC 81 MG tablet May resume 5 days after your surgery on 10/31/2016    . celecoxib (CELEBREX) 200 MG capsule     . clonazePAM (KLONOPIN) 1 MG tablet Take 0.5 mg by mouth as needed for anxiety.    . diazepam (VALIUM) 2 MG tablet diazepam 2 mg tablet    . DULoxetine (CYMBALTA) 30 MG capsule duloxetine 30 mg capsule,delayed release  TAKE ONE CAPSULE BY MOUTH EVERY DAY    . fexofenadine-pseudoephedrine (ALLEGRA-D 24) 180-240 MG per 24 hr tablet Take 1 tablet by mouth daily as needed (for sinus).    . gabapentin (NEURONTIN) 600 MG tablet Take 1 tablet (600 mg total) by mouth 3 (three) times daily. 100 tablet 2  . hydrochlorothiazide (HYDRODIURIL) 25 MG tablet Take 25 mg by mouth daily.    Marland Kitchen HYDROcodone-acetaminophen (NORCO) 10-325 MG tablet Take by mouth.    . pantoprazole (PROTONIX) 40 MG tablet Take 40 mg daily by mouth.  2   No  current facility-administered medications for this encounter.      REVIEW OF SYSTEMS: On review of systems, the patient reports that she is doing well overall. She denies any chest pain, shortness of breath, cough, fevers, chills, night sweats, unintended weight changes. She denies any bowel or bladder disturbances, and denies abdominal pain, nausea or vomiting. She denies any new musculoskeletal or joint aches or pains. A complete review of systems is obtained and is otherwise negative.     PHYSICAL EXAM:  Wt Readings from Last 3 Encounters:  04/19/17 196 lb (88.9 kg)  05/17/14 190 lb (86.2 kg)  07/25/13 180 lb (81.6 kg)   Temp Readings from Last 3 Encounters:  04/19/17 98 F (36.7  C) (Oral)  05/17/14 99.1 F (37.3 C) (Oral)  07/25/13 99 F (37.2 C) (Oral)   BP Readings from Last 3 Encounters:  04/19/17 (!) 159/73  05/17/14 161/97  07/25/13 136/90   Pulse Readings from Last 3 Encounters:  04/19/17 72  05/17/14 89  07/25/13 73     In general this is a well appearing African American female in no acute distress. She is alert and oriented x4 and appropriate throughout the examination. HEENT reveals that the patient is normocephalic, atraumatic. EOMs are intact. PERRLA. Skin is intact without any evidence of gross lesions. Cardiovascular exam reveals a regular rate and rhythm, no clicks rubs or murmurs are auscultated. Chest is clear to auscultation bilaterally. Lymphatic assessment is performed and does not reveal any adenopathy in the cervical, supraclavicular, axillary, or inguinal chains. Bilateral breast exam is performed and reveals no palpable masses or nipple bleeding or discharge in either breast. Evidence of prior biopsy is seen in the right breast. Abdomen has active bowel sounds in all quadrants and is intact. The abdomen is soft, non tender, non distended. Lower extremities are negative for pretibial pitting edema, deep calf tenderness, cyanosis or clubbing.   ECOG = 0  0 - Asymptomatic (Fully active, able to carry on all predisease activities without restriction)  1 - Symptomatic but completely ambulatory (Restricted in physically strenuous activity but ambulatory and able to carry out work of a light or sedentary nature. For example, light housework, office work)  2 - Symptomatic, <50% in bed during the day (Ambulatory and capable of all self care but unable to carry out any work activities. Up and about more than 50% of waking hours)  3 - Symptomatic, >50% in bed, but not bedbound (Capable of only limited self-care, confined to bed or chair 50% or more of waking hours)  4 - Bedbound (Completely disabled. Cannot carry on any self-care. Totally  confined to bed or chair)  5 - Death   Eustace Pen MM, Creech RH, Tormey DC, et al. [phone]). "Toxicity and response criteria of the [facility]". [place] Oncol. 5 (6): 649-55    LABORATORY DATA:  Lab Results  Component Value Date   WBC 6.0 04/19/2017   HGB 13.1 04/19/2017   HCT 41.1 04/19/2017   MCV 85.3 04/19/2017   PLT 211 04/19/2017   Lab Results  Component Value Date   NA 142 04/19/2017   K 3.7 04/19/2017   CL 98 05/16/2013   CO2 28 04/19/2017   Lab Results  Component Value Date   ALT 12 04/19/2017   AST 17 04/19/2017   ALKPHOS 58 04/19/2017   BILITOT 0.47 04/19/2017      RADIOGRAPHY: US Breast Ltd Uni Right Inc Axilla  Result Date: 04/07/2017 CLINICAL DATA:  Patient  returns today to evaluate calcifications and a possible mass in the right breast identified on recent screening mammogram. EXAM: 2D DIGITAL DIAGNOSTIC RIGHT MAMMOGRAM WITH CAD AND ADJUNCT TOMO ULTRASOUND RIGHT BREAST COMPARISON:  Previous exams including recent screening mammogram dated 03/29/2017. ACR Breast Density Category c: The breast tissue is heterogeneously dense, which may obscure small masses. FINDINGS: On today's additional views with spot compression and 3D tomosynthesis, an irregular mass is confirmed in the upper right breast, 12 o'clock axis region, at posterior depth, with associated architectural distortion and pleomorphic calcifications. The mass measures 1 cm greatest dimension. The mass and associated microcalcifications measure 1.4 cm. There are additional punctate and coarse heterogeneous calcifications in the upper right breast, 11-12 o'clock axis region, at middle depth, spanning approximately 5 cm as measured on a CC magnification view, most suspicious calcifications are best seen on the CC magnification views. Mammographic images were processed with CAD. Targeted ultrasound is performed, showing no sonographic correlate for the right breast mass. Ultrasound was also  performed of the upper-outer quadrant of the right breast corresponding to the area of calcifications showing only normal dense fibroglandular tissues and no suspicious solid or cystic masses. Right axilla was evaluated with ultrasound showing no enlarged or morphologically abnormal lymph nodes. IMPRESSION: 1. Highly suspicious mass within the upper right breast, 12 to 12:30 o'clock axis, at posterior depth, with associated architectural distortion and pleomorphic calcifications. The mass measures 1 cm greatest dimension. The mass and associated microcalcifications measure 1.4 cm. No sonographic correlate identified. Recommend stereotactic biopsy using 3D tomosynthesis guidance. 2. Additional punctate and coarse heterogeneous calcifications within the upper outer quadrant of the right breast, 11-12 o'clock axis region, at middle depth, with a suspicious segmental distribution, spanning approximately 5 cm, the most suspicious calcifications best seen on the CC magnification views. Recommend additional stereotactic biopsy for these right breast calcifications to exclude multifocal/multicentric disease and/or define extent of disease. RECOMMENDATION: 1. Stereotactic biopsy, with 3D tomosynthesis guidance, for the highly suspicious mass in the upper right breast, 12 to 12:30 o'clock axis, at posterior depth, with associated architecture distortion and pleomorphic calcifications. 2. Stereotactic biopsy for the additional suspicious calcifications in the upper outer quadrant of the right breast. Stereotactic biopsies are scheduled for October 31st at 1:45 p.m. I have discussed the findings and recommendations with the patient. Results were also provided in writing at the conclusion of the visit. If applicable, a reminder letter will be sent to the patient regarding the next appointment. BI-RADS CATEGORY  5: Highly suggestive of malignancy. Electronically Signed   By: Franki Cabot M.D.   On: 04/07/2017 11:12   Mm Diag  Breast Tomo Uni Right  Result Date: 04/07/2017 CLINICAL DATA:  Patient returns today to evaluate calcifications and a possible mass in the right breast identified on recent screening mammogram. EXAM: 2D DIGITAL DIAGNOSTIC RIGHT MAMMOGRAM WITH CAD AND ADJUNCT TOMO ULTRASOUND RIGHT BREAST COMPARISON:  Previous exams including recent screening mammogram dated 03/29/2017. ACR Breast Density Category c: The breast tissue is heterogeneously dense, which may obscure small masses. FINDINGS: On today's additional views with spot compression and 3D tomosynthesis, an irregular mass is confirmed in the upper right breast, 12 o'clock axis region, at posterior depth, with associated architectural distortion and pleomorphic calcifications. The mass measures 1 cm greatest dimension. The mass and associated microcalcifications measure 1.4 cm. There are additional punctate and coarse heterogeneous calcifications in the upper right breast, 11-12 o'clock axis region, at middle depth, spanning approximately 5 cm as measured on a CC  magnification view, most suspicious calcifications are best seen on the CC magnification views. Mammographic images were processed with CAD. Targeted ultrasound is performed, showing no sonographic correlate for the right breast mass. Ultrasound was also performed of the upper-outer quadrant of the right breast corresponding to the area of calcifications showing only normal dense fibroglandular tissues and no suspicious solid or cystic masses. Right axilla was evaluated with ultrasound showing no enlarged or morphologically abnormal lymph nodes. IMPRESSION: 1. Highly suspicious mass within the upper right breast, 12 to 12:30 o'clock axis, at posterior depth, with associated architectural distortion and pleomorphic calcifications. The mass measures 1 cm greatest dimension. The mass and associated microcalcifications measure 1.4 cm. No sonographic correlate identified. Recommend stereotactic biopsy using 3D  tomosynthesis guidance. 2. Additional punctate and coarse heterogeneous calcifications within the upper outer quadrant of the right breast, 11-12 o'clock axis region, at middle depth, with a suspicious segmental distribution, spanning approximately 5 cm, the most suspicious calcifications best seen on the CC magnification views. Recommend additional stereotactic biopsy for these right breast calcifications to exclude multifocal/multicentric disease and/or define extent of disease. RECOMMENDATION: 1. Stereotactic biopsy, with 3D tomosynthesis guidance, for the highly suspicious mass in the upper right breast, 12 to 12:30 o'clock axis, at posterior depth, with associated architecture distortion and pleomorphic calcifications. 2. Stereotactic biopsy for the additional suspicious calcifications in the upper outer quadrant of the right breast. Stereotactic biopsies are scheduled for October 31st at 1:45 p.m. I have discussed the findings and recommendations with the patient. Results were also provided in writing at the conclusion of the visit. If applicable, a reminder letter will be sent to the patient regarding the next appointment. BI-RADS CATEGORY  5: Highly suggestive of malignancy. Electronically Signed   By: Franki Cabot M.D.   On: 04/07/2017 11:12   Mm Clip Placement Right  Result Date: 04/12/2017 CLINICAL DATA:  Status post stereotactic core needle biopsy of 2 right breast lesions. EXAM: DIAGNOSTIC RIGHT MAMMOGRAM POST STEREOTACTIC BIOPSY COMPARISON:  Previous exam(s). FINDINGS: Mammographic images were obtained following stereotactic guided biopsy of 2 right breast lesions. Lesion 1, marked with an X shaped clip, lies in the far posterior, lateral aspect of the upper outer quadrant. Lesion 2 corresponds to the more centrally located loosely grouped calcifications also the upper-outer quadrant, marked with a coil shaped biopsy clip. Both clips are well-positioned. IMPRESSION: X shaped biopsy clip, lesion  1, and coil shaped biopsy clip, lesion 2, both well-positioned in the right breast following stereotactic core needle biopsy. Final Assessment: Post Procedure Mammograms for Marker Placement Electronically Signed   By: Lajean Manes M.D.   On: 04/12/2017 15:10   Mm Rt Breast Bx W Loc Dev 1st Lesion Image Bx Spec Stereo Guide  Addendum Date: 04/13/2017   ADDENDUM REPORT: 04/13/2017 10:24 ADDENDUM: Pathology revealed GRADE I INVASIVE DUCTAL CARCINOMA, DUCTAL CARCINOMA IN SITU WITH CALCIFICATIONS of the Right breast, lateral aspect of upper outer quadrant. LOW GRADE DUCTAL CARCINOMA IN SITU WITH CALCIFICATIONS, LOBULAR NEOPLASIA (ATYPICAL LOBULAR HYPERPLASIA), FIBROCYSTIC CHANGES WITH ADENOSIS AND CALCIFICATIONS of the Right breast, central, upper outer quadrant. This was found to be concordant by Dr. Lajean Manes. Pathology results were discussed with the patient by telephone. The patient reported doing well after the biopsies with tenderness at the sites. Post biopsy instructions and care were reviewed and questions were answered. The patient was encouraged to call [facility] for any additional concerns. The patient was referred to [facility] at  [facility] on April 19, 2017. Pathology results reported by Terie Purser, RN on 04/13/2017. Electronically Signed   By: Lajean Manes M.D.   On: 04/13/2017 10:24   Result Date: 04/13/2017 CLINICAL DATA:  The patient presents stereotactic core needle biopsy of a small spiculated right breast mass with associated calcifications as well as additional area of more loosely grouped calcifications. EXAM: RIGHT BREAST STEREOTACTIC CORE NEEDLE BIOPSY COMPARISON:  Previous exams. FINDINGS: The patient and I discussed the procedure of stereotactic-guided biopsy including benefits and alternatives. We discussed the high likelihood of a successful procedure. We discussed the risks of the  procedure including infection, bleeding, tissue injury, clip migration, and inadequate sampling. Informed written consent was given. The usual time out protocol was performed immediately prior to the procedure. Using sterile technique and 1% Lidocaine as local anesthetic, under stereotactic guidance, a 9 gauge vacuum assisted device was used to perform core needle biopsy of the small spiculated mass and associated calcifications in the posterolateral aspect of the upper-outer quadrant using a superior approach. Specimen radiograph was performed showing multiple calcifications for which biopsy was performed. Specimens with calcifications are identified for pathology. Lesion quadrant: Upper outer quadrant Using sterile technique and 1% Lidocaine as local anesthetic, under stereotactic guidance, a 9 gauge vacuum assisted device was used to perform core needle biopsy of calcifications in the more central aspect of the upper-outer quadrant using a superior approach. Specimen radiograph was performed showing multiple calcifications for which biopsy was performed. Specimens with calcifications are identified for pathology. Lesion quadrant: Upper outer quadrant At the conclusion of the procedure, an X shaped tissue marker clip was deployed into the posterior in the more lateral biopsy cavity and a coil shaped tissue marker clip was deployed into the more central upper outer quadrant biopsy cavity. Follow-up 2-view mammogram was performed and dictated separately. IMPRESSION: Stereotactic-guided biopsy of 2 lesions in the right breast. No apparent complications. Electronically Signed: By: Lajean Manes M.D. On: 04/12/2017 14:54   Mm Rt Breast Bx W Loc Dev Ea Ad Lesion Img Bx Spec Stereo Guide  Addendum Date: 04/13/2017   ADDENDUM REPORT: 04/13/2017 10:24 ADDENDUM: Pathology revealed GRADE I INVASIVE DUCTAL CARCINOMA, DUCTAL CARCINOMA IN SITU WITH CALCIFICATIONS of the Right breast, lateral aspect of upper outer quadrant.  LOW GRADE DUCTAL CARCINOMA IN SITU WITH CALCIFICATIONS, LOBULAR NEOPLASIA (ATYPICAL LOBULAR HYPERPLASIA), FIBROCYSTIC CHANGES WITH ADENOSIS AND CALCIFICATIONS of the Right breast, central, upper outer quadrant. This was found to be concordant by Dr. Lajean Manes. Pathology results were discussed with the patient by telephone. The patient reported doing well after the biopsies with tenderness at the sites. Post biopsy instructions and care were reviewed and questions were answered. The patient was encouraged to call [facility] for any additional concerns. The patient was referred to [facility] at [facility] on April 19, 2017. Pathology results reported by Terie Purser, RN on 04/13/2017. Electronically Signed   By: Lajean Manes M.D.   On: 04/13/2017 10:24   Result Date: 04/13/2017 CLINICAL DATA:  The patient presents stereotactic core needle biopsy of a small spiculated right breast mass with associated calcifications as well as additional area of more loosely grouped calcifications. EXAM: RIGHT BREAST STEREOTACTIC CORE NEEDLE BIOPSY COMPARISON:  Previous exams. FINDINGS: The patient and I discussed the procedure of stereotactic-guided biopsy including benefits and alternatives. We discussed the high likelihood of a successful procedure. We discussed the risks of the  procedure including infection, bleeding, tissue injury, clip migration, and inadequate sampling. Informed written consent was given. The usual time out protocol was performed immediately prior to the procedure. Using sterile technique and 1% Lidocaine as local anesthetic, under stereotactic guidance, a 9 gauge vacuum assisted device was used to perform core needle biopsy of the small spiculated mass and associated calcifications in the posterolateral aspect of the upper-outer quadrant using a superior approach. Specimen radiograph was performed showing multiple  calcifications for which biopsy was performed. Specimens with calcifications are identified for pathology. Lesion quadrant: Upper outer quadrant Using sterile technique and 1% Lidocaine as local anesthetic, under stereotactic guidance, a 9 gauge vacuum assisted device was used to perform core needle biopsy of calcifications in the more central aspect of the upper-outer quadrant using a superior approach. Specimen radiograph was performed showing multiple calcifications for which biopsy was performed. Specimens with calcifications are identified for pathology. Lesion quadrant: Upper outer quadrant At the conclusion of the procedure, an X shaped tissue marker clip was deployed into the posterior in the more lateral biopsy cavity and a coil shaped tissue marker clip was deployed into the more central upper outer quadrant biopsy cavity. Follow-up 2-view mammogram was performed and dictated separately. IMPRESSION: Stereotactic-guided biopsy of 2 lesions in the right breast. No apparent complications. Electronically Signed: By: Lajean Manes M.D. On: 04/12/2017 14:54       IMPRESSION/PLAN: 1. 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 Dr. Lisbeth Renshaw discusses the pathology findings and reviews the nature of invasive and insitu breast disease. The consensus from the breast conference included moving forward with an MRI to better document extent of disease as the patient would like breast conservation. Provided that her MRI does not reveal additional areas of concern, she would proceed with lumpectomy with  sentinel mapping. The tumor will be tested for oncotype dx score to determine a role for systemic therapy. Provided that chemotherapy is not indicated, the patient's course would then be followed by external radiotherapy to the breast followed by antiestrogen therapy, again provided that lumpectomy is pursued. We discussed the risks, benefits, short, and  long term effects of radiotherapy, and the patient is interested in proceeding. Dr. Lisbeth Renshaw discusses the delivery and logistics of radiotherapy and anticipates a course of 6 1/2 weeks. We will see her back about 2 weeks after surgery to move forward with the simulation and planning process and anticipate starting radiotherapy about 4 weeks after surgery.    The above documentation reflects my direct findings during this shared patient visit. Please see the separate note by Dr. Lisbeth Renshaw on this date for the remainder of the patient's plan of care.    Carola Rhine, PAC

## 2017-04-19 NOTE — Progress Notes (Signed)
Nutrition Assessment  Reason for Assessment:  Pt seen in Breast Clinic  ASSESSMENT:   56 year old female with new diagnosis of breast cancer.  Past medical history of HTN  Medications:  reviewed  Labs: reviewed  Anthropometrics:   Height: 61 inches Weight: 196 lb BMI: 37   NUTRITION DIAGNOSIS: Food and nutrition related knowledge deficit related to new diagnosis of breast cancer as evidenced by no prior need for nutrition related information.  INTERVENTION:   Discussed and provided packet of information regarding nutritional tips for breast cancer patients.  Questions answered.  Teachback method used.  Contact information provided and patient knows to contact me with questions/concerns.    MONITORING, EVALUATION, and GOAL: Pt will consume a healthy plant based diet to maintain lean body mass throughout treatment.   Xenia Nile B. Zenia Resides, [place], [place] Registered Dietitian [phone] (pager)

## 2017-04-19 NOTE — Patient Instructions (Signed)

## 2017-04-20 ENCOUNTER — Encounter: Payer: Self-pay | Admitting: Physical Therapy

## 2017-04-21 ENCOUNTER — Ambulatory Visit
Admission: RE | Admit: 2017-04-21 | Discharge: 2017-04-21 | Disposition: A | Discharge status: Home / Self Care | Payer: 59 | Source: Ambulatory Visit | Attending: General Surgery | Admitting: General Surgery

## 2017-04-21 DIAGNOSIS — Z170 Estrogen receptor positive status [ER+]: Principal | ICD-10-CM

## 2017-04-21 DIAGNOSIS — C50411 Malignant neoplasm of upper-outer quadrant of right female breast: Secondary | ICD-10-CM

## 2017-04-21 MED ORDER — GADOBENATE DIMEGLUMINE 529 MG/ML IV SOLN
20.0000 mL | Freq: Once | INTRAVENOUS | Status: AC | PRN
  Administered 2017-04-21: 20 mL via INTRAVENOUS

## 2017-04-24 ENCOUNTER — Telehealth: Payer: Self-pay | Admitting: *Deleted

## 2017-04-24 NOTE — Telephone Encounter (Signed)
Spoke to pt concerning Felicia Bauer from 04/19/17. Denies questions or concerns regarding dx or treatment care plan. Encourage pt to call with needs. Received verbal understanding.

## 2017-04-25 ENCOUNTER — Other Ambulatory Visit: Payer: Self-pay | Admitting: General Surgery

## 2017-04-25 DIAGNOSIS — Z170 Estrogen receptor positive status [ER+]: Principal | ICD-10-CM

## 2017-04-25 DIAGNOSIS — C50411 Malignant neoplasm of upper-outer quadrant of right female breast: Secondary | ICD-10-CM | POA: Diagnosis not present

## 2017-04-28 ENCOUNTER — Other Ambulatory Visit: Payer: Self-pay | Admitting: General Surgery

## 2017-04-28 DIAGNOSIS — Z170 Estrogen receptor positive status [ER+]: Secondary | ICD-10-CM | POA: Diagnosis not present

## 2017-04-28 DIAGNOSIS — C50411 Malignant neoplasm of upper-outer quadrant of right female breast: Secondary | ICD-10-CM | POA: Diagnosis not present

## 2017-05-02 ENCOUNTER — Encounter (HOSPITAL_BASED_OUTPATIENT_CLINIC_OR_DEPARTMENT_OTHER): Payer: Self-pay | Admitting: *Deleted

## 2017-05-02 ENCOUNTER — Other Ambulatory Visit: Payer: Self-pay

## 2017-05-02 NOTE — Pre-Procedure Instructions (Signed)
To come for EKG; to pick up Ensure pre-surgery drink 10 oz. - to drink by 0930 DOS; to pick up Hibiclens wash to use night before surgery and AM DOS.

## 2017-05-09 ENCOUNTER — Encounter (HOSPITAL_BASED_OUTPATIENT_CLINIC_OR_DEPARTMENT_OTHER)
Admission: RE | Admit: 2017-05-09 | Discharge: 2017-05-09 | Disposition: A | Discharge status: Home / Self Care | Payer: 59 | Source: Ambulatory Visit | Attending: General Surgery | Admitting: General Surgery

## 2017-05-09 ENCOUNTER — Other Ambulatory Visit: Payer: Self-pay

## 2017-05-09 DIAGNOSIS — Z79899 Other long term (current) drug therapy: Secondary | ICD-10-CM | POA: Diagnosis not present

## 2017-05-09 DIAGNOSIS — Z7982 Long term (current) use of aspirin: Secondary | ICD-10-CM | POA: Diagnosis not present

## 2017-05-09 DIAGNOSIS — M1990 Unspecified osteoarthritis, unspecified site: Secondary | ICD-10-CM | POA: Diagnosis not present

## 2017-05-09 DIAGNOSIS — N6091 Unspecified benign mammary dysplasia of right breast: Secondary | ICD-10-CM | POA: Diagnosis not present

## 2017-05-09 DIAGNOSIS — N6011 Diffuse cystic mastopathy of right breast: Secondary | ICD-10-CM | POA: Diagnosis not present

## 2017-05-09 DIAGNOSIS — C50411 Malignant neoplasm of upper-outer quadrant of right female breast: Secondary | ICD-10-CM | POA: Diagnosis not present

## 2017-05-09 DIAGNOSIS — K219 Gastro-esophageal reflux disease without esophagitis: Secondary | ICD-10-CM | POA: Diagnosis not present

## 2017-05-09 DIAGNOSIS — Z170 Estrogen receptor positive status [ER+]: Secondary | ICD-10-CM | POA: Diagnosis not present

## 2017-05-09 DIAGNOSIS — E669 Obesity, unspecified: Secondary | ICD-10-CM | POA: Diagnosis not present

## 2017-05-09 DIAGNOSIS — Z800 Family history of malignant neoplasm of digestive organs: Secondary | ICD-10-CM | POA: Diagnosis not present

## 2017-05-09 DIAGNOSIS — I341 Nonrheumatic mitral (valve) prolapse: Secondary | ICD-10-CM | POA: Diagnosis not present

## 2017-05-09 DIAGNOSIS — Z6835 Body mass index (BMI) 35.0-35.9, adult: Secondary | ICD-10-CM | POA: Diagnosis not present

## 2017-05-09 DIAGNOSIS — F419 Anxiety disorder, unspecified: Secondary | ICD-10-CM | POA: Diagnosis not present

## 2017-05-09 DIAGNOSIS — I10 Essential (primary) hypertension: Secondary | ICD-10-CM | POA: Diagnosis not present

## 2017-05-09 DIAGNOSIS — R011 Cardiac murmur, unspecified: Secondary | ICD-10-CM | POA: Diagnosis not present

## 2017-05-09 NOTE — Progress Notes (Signed)
Pt given Ensure and instructed to drink by 0930 day of surgery with teach back method.

## 2017-05-10 ENCOUNTER — Ambulatory Visit
Admission: RE | Admit: 2017-05-10 | Discharge: 2017-05-10 | Disposition: A | Discharge status: Home / Self Care | Payer: 59 | Source: Ambulatory Visit | Attending: General Surgery | Admitting: General Surgery

## 2017-05-10 DIAGNOSIS — C50911 Malignant neoplasm of unspecified site of right female breast: Secondary | ICD-10-CM | POA: Diagnosis not present

## 2017-05-10 DIAGNOSIS — Z170 Estrogen receptor positive status [ER+]: Principal | ICD-10-CM

## 2017-05-10 DIAGNOSIS — C50411 Malignant neoplasm of upper-outer quadrant of right female breast: Secondary | ICD-10-CM

## 2017-05-11 ENCOUNTER — Encounter (HOSPITAL_BASED_OUTPATIENT_CLINIC_OR_DEPARTMENT_OTHER): Payer: Self-pay | Admitting: *Deleted

## 2017-05-11 ENCOUNTER — Ambulatory Visit (HOSPITAL_COMMUNITY)
Admission: RE | Admit: 2017-05-11 | Discharge: 2017-05-11 | Disposition: A | Discharge status: Home / Self Care | Payer: 59 | Source: Ambulatory Visit | Attending: General Surgery | Admitting: General Surgery

## 2017-05-11 ENCOUNTER — Encounter (HOSPITAL_BASED_OUTPATIENT_CLINIC_OR_DEPARTMENT_OTHER)
Admission: RE | Disposition: A | Discharge status: Home / Self Care | Payer: Self-pay | Source: Ambulatory Visit | Attending: General Surgery

## 2017-05-11 ENCOUNTER — Ambulatory Visit
Admission: RE | Admit: 2017-05-11 | Discharge: 2017-05-11 | Disposition: A | Discharge status: Home / Self Care | Payer: 59 | Source: Ambulatory Visit | Attending: General Surgery | Admitting: General Surgery

## 2017-05-11 ENCOUNTER — Ambulatory Visit (HOSPITAL_BASED_OUTPATIENT_CLINIC_OR_DEPARTMENT_OTHER): Payer: 59 | Admitting: Anesthesiology

## 2017-05-11 ENCOUNTER — Ambulatory Visit (HOSPITAL_BASED_OUTPATIENT_CLINIC_OR_DEPARTMENT_OTHER)
Admission: RE | Admit: 2017-05-11 | Discharge: 2017-05-11 | Disposition: A | Discharge status: Home / Self Care | Payer: 59 | Source: Ambulatory Visit | Attending: General Surgery | Admitting: General Surgery

## 2017-05-11 ENCOUNTER — Other Ambulatory Visit: Payer: Self-pay

## 2017-05-11 DIAGNOSIS — Z170 Estrogen receptor positive status [ER+]: Principal | ICD-10-CM

## 2017-05-11 DIAGNOSIS — C50411 Malignant neoplasm of upper-outer quadrant of right female breast: Secondary | ICD-10-CM | POA: Diagnosis not present

## 2017-05-11 DIAGNOSIS — Z79899 Other long term (current) drug therapy: Secondary | ICD-10-CM | POA: Insufficient documentation

## 2017-05-11 DIAGNOSIS — Z6835 Body mass index (BMI) 35.0-35.9, adult: Secondary | ICD-10-CM | POA: Insufficient documentation

## 2017-05-11 DIAGNOSIS — C50811 Malignant neoplasm of overlapping sites of right female breast: Secondary | ICD-10-CM | POA: Diagnosis not present

## 2017-05-11 DIAGNOSIS — E669 Obesity, unspecified: Secondary | ICD-10-CM | POA: Insufficient documentation

## 2017-05-11 DIAGNOSIS — G8918 Other acute postprocedural pain: Secondary | ICD-10-CM | POA: Diagnosis not present

## 2017-05-11 DIAGNOSIS — I341 Nonrheumatic mitral (valve) prolapse: Secondary | ICD-10-CM | POA: Insufficient documentation

## 2017-05-11 DIAGNOSIS — Z7982 Long term (current) use of aspirin: Secondary | ICD-10-CM | POA: Insufficient documentation

## 2017-05-11 DIAGNOSIS — R011 Cardiac murmur, unspecified: Secondary | ICD-10-CM | POA: Insufficient documentation

## 2017-05-11 DIAGNOSIS — N6091 Unspecified benign mammary dysplasia of right breast: Secondary | ICD-10-CM | POA: Insufficient documentation

## 2017-05-11 DIAGNOSIS — N6011 Diffuse cystic mastopathy of right breast: Secondary | ICD-10-CM | POA: Diagnosis not present

## 2017-05-11 DIAGNOSIS — M1990 Unspecified osteoarthritis, unspecified site: Secondary | ICD-10-CM | POA: Insufficient documentation

## 2017-05-11 DIAGNOSIS — F419 Anxiety disorder, unspecified: Secondary | ICD-10-CM | POA: Insufficient documentation

## 2017-05-11 DIAGNOSIS — Z800 Family history of malignant neoplasm of digestive organs: Secondary | ICD-10-CM | POA: Insufficient documentation

## 2017-05-11 DIAGNOSIS — D0511 Intraductal carcinoma in situ of right breast: Secondary | ICD-10-CM | POA: Diagnosis not present

## 2017-05-11 DIAGNOSIS — I10 Essential (primary) hypertension: Secondary | ICD-10-CM | POA: Insufficient documentation

## 2017-05-11 DIAGNOSIS — K219 Gastro-esophageal reflux disease without esophagitis: Secondary | ICD-10-CM | POA: Insufficient documentation

## 2017-05-11 DIAGNOSIS — C50911 Malignant neoplasm of unspecified site of right female breast: Secondary | ICD-10-CM | POA: Diagnosis not present

## 2017-05-11 HISTORY — DX: Other complications of anesthesia, initial encounter: T88.59XA

## 2017-05-11 HISTORY — DX: Adverse effect of unspecified anesthetic, initial encounter: T41.45XA

## 2017-05-11 HISTORY — DX: Unspecified osteoarthritis, unspecified site: M19.90

## 2017-05-11 HISTORY — DX: Personal history of other specified conditions: Z87.898

## 2017-05-11 HISTORY — PX: RADIOACTIVE SEED GUIDED PARTIAL MASTECTOMY WITH AXILLARY SENTINEL LYMPH NODE BIOPSY: SHX6520

## 2017-05-11 HISTORY — DX: Nonrheumatic mitral (valve) prolapse: I34.1

## 2017-05-11 HISTORY — DX: Presence of dental prosthetic device (complete) (partial): Z97.2

## 2017-05-11 HISTORY — DX: Malignant neoplasm of unspecified site of right female breast: C50.911

## 2017-05-11 HISTORY — DX: Panic disorder (episodic paroxysmal anxiety): F41.0

## 2017-05-11 SURGERY — RADIOACTIVE SEED GUIDED PARTIAL MASTECTOMY WITH AXILLARY SENTINEL LYMPH NODE BIOPSY
Anesthesia: General | Site: Breast | Laterality: Right

## 2017-05-11 MED ORDER — EPHEDRINE 5 MG/ML INJ
INTRAVENOUS | Status: AC
Start: 2017-05-11 — End: 2017-05-11
  Filled 2017-05-11: qty 10

## 2017-05-11 MED ORDER — OXYCODONE HCL 5 MG PO TABS: 5.0000 mg | ORAL_TABLET | Freq: Four times a day (QID) | ORAL | 0 refills | Status: DC | PRN

## 2017-05-11 MED ORDER — MIDAZOLAM HCL 2 MG/2ML IJ SOLN
INTRAMUSCULAR | Status: AC
  Filled 2017-05-11: qty 2

## 2017-05-11 MED ORDER — METHYLENE BLUE 0.5 % INJ SOLN
INTRAVENOUS | Status: DC | PRN
  Administered 2017-05-11: 5 mL via INTRAMUSCULAR

## 2017-05-11 MED ORDER — CHLORHEXIDINE GLUCONATE CLOTH 2 % EX PADS: 6.0000 | MEDICATED_PAD | Freq: Once | CUTANEOUS | Status: DC

## 2017-05-11 MED ORDER — ACETAMINOPHEN 500 MG PO TABS
ORAL_TABLET | ORAL | Status: AC
  Filled 2017-05-11: qty 2

## 2017-05-11 MED ORDER — BUPIVACAINE-EPINEPHRINE (PF) 0.5% -1:200000 IJ SOLN
INTRAMUSCULAR | Status: DC | PRN
  Administered 2017-05-11: 30 mL via PERINEURAL

## 2017-05-11 MED ORDER — CEFAZOLIN SODIUM-DEXTROSE 2-4 GM/100ML-% IV SOLN
2.0000 g | INTRAVENOUS | Status: AC
Start: 2017-05-11 — End: 2017-05-11
  Administered 2017-05-11: 2 g via INTRAVENOUS

## 2017-05-11 MED ORDER — FENTANYL CITRATE (PF) 100 MCG/2ML IJ SOLN
INTRAMUSCULAR | Status: AC
  Filled 2017-05-11: qty 2

## 2017-05-11 MED ORDER — LIDOCAINE-EPINEPHRINE (PF) 1 %-1:200000 IJ SOLN
INTRAMUSCULAR | Status: AC
  Filled 2017-05-11: qty 30

## 2017-05-11 MED ORDER — ONDANSETRON HCL 4 MG/2ML IJ SOLN
INTRAMUSCULAR | Status: AC
  Filled 2017-05-11: qty 2

## 2017-05-11 MED ORDER — ARTIFICIAL TEARS OPHTHALMIC OINT
TOPICAL_OINTMENT | OPHTHALMIC | Status: AC
  Filled 2017-05-11: qty 3.5

## 2017-05-11 MED ORDER — CELECOXIB 200 MG PO CAPS
200.0000 mg | ORAL_CAPSULE | ORAL | Status: AC
Start: 2017-05-11 — End: 2017-05-11
  Administered 2017-05-11: 200 mg via ORAL

## 2017-05-11 MED ORDER — ACETAMINOPHEN 500 MG PO TABS
1000.0000 mg | ORAL_TABLET | ORAL | Status: AC
Start: 2017-05-11 — End: 2017-05-11
  Administered 2017-05-11: 1000 mg via ORAL

## 2017-05-11 MED ORDER — LIDOCAINE 2% (20 MG/ML) 5 ML SYRINGE
INTRAMUSCULAR | Status: AC
  Filled 2017-05-11: qty 5

## 2017-05-11 MED ORDER — SODIUM CHLORIDE 0.9 % IJ SOLN
INTRAMUSCULAR | Status: AC
  Filled 2017-05-11: qty 10

## 2017-05-11 MED ORDER — MIDAZOLAM HCL 2 MG/2ML IJ SOLN
INTRAMUSCULAR | Status: AC
Start: 2017-05-11 — End: 2017-05-11
  Filled 2017-05-11: qty 2

## 2017-05-11 MED ORDER — DEXAMETHASONE SODIUM PHOSPHATE 4 MG/ML IJ SOLN
INTRAMUSCULAR | Status: DC | PRN
Start: 2017-05-11 — End: 2017-05-11
  Administered 2017-05-11: 10 mg via INTRAVENOUS

## 2017-05-11 MED ORDER — ONDANSETRON HCL 4 MG/2ML IJ SOLN
INTRAMUSCULAR | Status: DC | PRN
  Administered 2017-05-11: 4 mg via INTRAVENOUS

## 2017-05-11 MED ORDER — SUCCINYLCHOLINE CHLORIDE 200 MG/10ML IV SOSY
PREFILLED_SYRINGE | INTRAVENOUS | Status: AC
Start: 2017-05-11 — End: 2017-05-11
  Filled 2017-05-11: qty 10

## 2017-05-11 MED ORDER — MIDAZOLAM HCL 2 MG/2ML IJ SOLN
1.0000 mg | INTRAMUSCULAR | Status: DC | PRN
  Administered 2017-05-11: 2 mg via INTRAVENOUS
  Administered 2017-05-11: 1 mg via INTRAVENOUS
  Administered 2017-05-11: 2 mg via INTRAVENOUS

## 2017-05-11 MED ORDER — PHENYLEPHRINE HCL 10 MG/ML IJ SOLN
INTRAMUSCULAR | Status: AC
  Filled 2017-05-11: qty 1

## 2017-05-11 MED ORDER — METHYLENE BLUE 0.5 % INJ SOLN
INTRAVENOUS | Status: AC
  Filled 2017-05-11: qty 10

## 2017-05-11 MED ORDER — EPHEDRINE SULFATE 50 MG/ML IJ SOLN
INTRAMUSCULAR | Status: DC | PRN
  Administered 2017-05-11 (×2): 15 mg via INTRAVENOUS

## 2017-05-11 MED ORDER — ROCURONIUM BROMIDE 100 MG/10ML IV SOLN
INTRAVENOUS | Status: DC | PRN
  Administered 2017-05-11: 50 mg via INTRAVENOUS

## 2017-05-11 MED ORDER — SUFENTANIL CITRATE 50 MCG/ML IV SOLN
INTRAVENOUS | Status: DC | PRN
  Administered 2017-05-11: 10 ug via INTRAVENOUS

## 2017-05-11 MED ORDER — LIDOCAINE-EPINEPHRINE (PF) 1 %-1:200000 IJ SOLN
INTRAMUSCULAR | Status: DC | PRN
  Administered 2017-05-11: 30 mL

## 2017-05-11 MED ORDER — CEFAZOLIN SODIUM-DEXTROSE 2-4 GM/100ML-% IV SOLN
INTRAVENOUS | Status: AC
  Filled 2017-05-11: qty 100

## 2017-05-11 MED ORDER — GABAPENTIN 300 MG PO CAPS
300.0000 mg | ORAL_CAPSULE | ORAL | Status: AC
  Administered 2017-05-11: 300 mg via ORAL

## 2017-05-11 MED ORDER — TECHNETIUM TC 99M SULFUR COLLOID FILTERED
1.0000 | Freq: Once | INTRAVENOUS | Status: AC | PRN
  Administered 2017-05-11: 1 via INTRADERMAL

## 2017-05-11 MED ORDER — LACTATED RINGERS IV SOLN
INTRAVENOUS | Status: DC
  Administered 2017-05-11 (×2): via INTRAVENOUS

## 2017-05-11 MED ORDER — DEXTROSE 5 % IV SOLN
INTRAVENOUS | Status: DC | PRN
  Administered 2017-05-11: 40 ug/min via INTRAVENOUS

## 2017-05-11 MED ORDER — BUPIVACAINE HCL (PF) 0.25 % IJ SOLN
INTRAMUSCULAR | Status: AC
  Filled 2017-05-11: qty 30

## 2017-05-11 MED ORDER — FENTANYL CITRATE (PF) 100 MCG/2ML IJ SOLN
50.0000 ug | INTRAMUSCULAR | Status: AC | PRN
  Administered 2017-05-11 (×2): 50 ug via INTRAVENOUS
  Administered 2017-05-11: 100 ug via INTRAVENOUS

## 2017-05-11 MED ORDER — SUGAMMADEX SODIUM 200 MG/2ML IV SOLN
INTRAVENOUS | Status: DC | PRN
  Administered 2017-05-11: 200 mg via INTRAVENOUS

## 2017-05-11 MED ORDER — PROPOFOL 10 MG/ML IV BOLUS
INTRAVENOUS | Status: DC | PRN
  Administered 2017-05-11: 200 mg via INTRAVENOUS

## 2017-05-11 MED ORDER — SUFENTANIL CITRATE 50 MCG/ML IV SOLN
INTRAVENOUS | Status: AC
  Filled 2017-05-11: qty 1

## 2017-05-11 MED ORDER — PHENYLEPHRINE 40 MCG/ML (10ML) SYRINGE FOR IV PUSH (FOR BLOOD PRESSURE SUPPORT)
PREFILLED_SYRINGE | INTRAVENOUS | Status: AC
  Filled 2017-05-11: qty 10

## 2017-05-11 MED ORDER — DEXAMETHASONE SODIUM PHOSPHATE 10 MG/ML IJ SOLN
INTRAMUSCULAR | Status: AC
  Filled 2017-05-11: qty 1

## 2017-05-11 MED ORDER — GABAPENTIN 300 MG PO CAPS
ORAL_CAPSULE | ORAL | Status: AC
  Filled 2017-05-11: qty 1

## 2017-05-11 MED ORDER — SCOPOLAMINE 1 MG/3DAYS TD PT72: 1.0000 | MEDICATED_PATCH | Freq: Once | TRANSDERMAL | Status: DC | PRN

## 2017-05-11 MED ORDER — CELECOXIB 200 MG PO CAPS
ORAL_CAPSULE | ORAL | Status: AC
  Filled 2017-05-11: qty 1

## 2017-05-11 SURGICAL SUPPLY — 70 items
ADH SKN CLS APL DERMABOND .7 (GAUZE/BANDAGES/DRESSINGS) ×1
APPLIER CLIP 9.375 MED OPEN (MISCELLANEOUS)
APR CLP MED 9.3 20 MLT OPN (MISCELLANEOUS)
BINDER BREAST LRG (GAUZE/BANDAGES/DRESSINGS) IMPLANT
BINDER BREAST MEDIUM (GAUZE/BANDAGES/DRESSINGS) IMPLANT
BINDER BREAST XLRG (GAUZE/BANDAGES/DRESSINGS) IMPLANT
BINDER BREAST XXLRG (GAUZE/BANDAGES/DRESSINGS) ×2 IMPLANT
BLADE HEX COATED 2.75 (ELECTRODE) ×3 IMPLANT
BLADE SURG 10 STRL SS (BLADE) ×3 IMPLANT
BLADE SURG 15 STRL LF DISP TIS (BLADE) ×1 IMPLANT
BLADE SURG 15 STRL SS (BLADE) ×3
BNDG COHESIVE 4X5 TAN STRL (GAUZE/BANDAGES/DRESSINGS) ×3 IMPLANT
CANISTER SUC SOCK COL 7IN (MISCELLANEOUS) IMPLANT
CANISTER SUCT 1200ML W/VALVE (MISCELLANEOUS) ×3 IMPLANT
CHLORAPREP W/TINT 26ML (MISCELLANEOUS) ×3 IMPLANT
CLIP APPLIE 9.375 MED OPEN (MISCELLANEOUS) IMPLANT
CLIP VESOCCLUDE LG 6/CT (CLIP) ×3 IMPLANT
CLIP VESOCCLUDE MED 6/CT (CLIP) ×3 IMPLANT
CLIP VESOCCLUDE SM WIDE 6/CT (CLIP) IMPLANT
CLOSURE WOUND 1/2 X4 (GAUZE/BANDAGES/DRESSINGS) ×1
COVER MAYO STAND STRL (DRAPES) ×3 IMPLANT
COVER PROBE W GEL 5X96 (DRAPES) ×3 IMPLANT
DECANTER SPIKE VIAL GLASS SM (MISCELLANEOUS) IMPLANT
DERMABOND ADVANCED (GAUZE/BANDAGES/DRESSINGS) ×2
DERMABOND ADVANCED .7 DNX12 (GAUZE/BANDAGES/DRESSINGS) ×1 IMPLANT
DEVICE DUBIN W/COMP PLATE 8390 (MISCELLANEOUS) ×3 IMPLANT
DRAPE SURG 17X23 STRL (DRAPES) ×2 IMPLANT
DRAPE UTILITY XL STRL (DRAPES) ×3 IMPLANT
DRSG PAD ABDOMINAL 8X10 ST (GAUZE/BANDAGES/DRESSINGS) ×2 IMPLANT
ELECT COATED BLADE 2.86 ST (ELECTRODE) ×2 IMPLANT
ELECT REM PT RETURN 9FT ADLT (ELECTROSURGICAL) ×3
ELECTRODE REM PT RTRN 9FT ADLT (ELECTROSURGICAL) ×1 IMPLANT
GAUZE SPONGE 4X4 12PLY STRL LF (GAUZE/BANDAGES/DRESSINGS) ×3 IMPLANT
GLOVE BIO SURGEON STRL SZ 6 (GLOVE) ×3 IMPLANT
GLOVE BIO SURGEON STRL SZ7 (GLOVE) ×2 IMPLANT
GLOVE BIOGEL PI IND STRL 6.5 (GLOVE) ×1 IMPLANT
GLOVE BIOGEL PI INDICATOR 6.5 (GLOVE) ×2
GOWN STRL REUS W/ TWL LRG LVL3 (GOWN DISPOSABLE) ×1 IMPLANT
GOWN STRL REUS W/TWL 2XL LVL3 (GOWN DISPOSABLE) ×3 IMPLANT
GOWN STRL REUS W/TWL LRG LVL3 (GOWN DISPOSABLE)
KIT MARKER MARGIN INK (KITS) ×3 IMPLANT
LIGHT WAVEGUIDE WIDE FLAT (MISCELLANEOUS) ×2 IMPLANT
NDL HYPO 25X1 1.5 SAFETY (NEEDLE) ×1 IMPLANT
NDL SAFETY ECLIPSE 18X1.5 (NEEDLE) IMPLANT
NEEDLE HYPO 18GX1.5 SHARP (NEEDLE)
NEEDLE HYPO 25X1 1.5 SAFETY (NEEDLE) ×3 IMPLANT
NS IRRIG 1000ML POUR BTL (IV SOLUTION) ×2 IMPLANT
PACK BASIN DAY SURGERY FS (CUSTOM PROCEDURE TRAY) ×3 IMPLANT
PACK UNIVERSAL I (CUSTOM PROCEDURE TRAY) ×3 IMPLANT
PENCIL BUTTON HOLSTER BLD 10FT (ELECTRODE) ×3 IMPLANT
SLEEVE SCD COMPRESS KNEE MED (MISCELLANEOUS) ×3 IMPLANT
SPONGE LAP 18X18 X RAY DECT (DISPOSABLE) ×5 IMPLANT
STAPLER VISISTAT 35W (STAPLE) IMPLANT
STOCKINETTE IMPERVIOUS LG (DRAPES) ×3 IMPLANT
STRIP CLOSURE SKIN 1/2X4 (GAUZE/BANDAGES/DRESSINGS) ×2 IMPLANT
SUT ETHILON 2 0 FS 18 (SUTURE) IMPLANT
SUT MNCRL AB 4-0 PS2 18 (SUTURE) ×5 IMPLANT
SUT MON AB 5-0 PS2 18 (SUTURE) IMPLANT
SUT SILK 2 0 SH (SUTURE) IMPLANT
SUT VIC AB 2-0 SH 27 (SUTURE) ×3
SUT VIC AB 2-0 SH 27XBRD (SUTURE) ×1 IMPLANT
SUT VIC AB 3-0 SH 27 (SUTURE) ×6
SUT VIC AB 3-0 SH 27X BRD (SUTURE) ×1 IMPLANT
SUT VIC AB 5-0 PS2 18 (SUTURE) IMPLANT
SYR CONTROL 10ML LL (SYRINGE) ×3 IMPLANT
TOWEL OR 17X24 6PK STRL BLUE (TOWEL DISPOSABLE) ×3 IMPLANT
TOWEL OR NON WOVEN STRL DISP B (DISPOSABLE) ×1 IMPLANT
TUBE CONNECTING 20'X1/4 (TUBING) ×1
TUBE CONNECTING 20X1/4 (TUBING) ×2 IMPLANT
YANKAUER SUCT BULB TIP NO VENT (SUCTIONS) IMPLANT

## 2017-05-11 NOTE — Progress Notes (Signed)
Assisted Dr. Gifford Shave with right, ultrasound guided, pectoralis block. Side rails up, monitors on throughout procedure. See vital signs in flow sheet. Tolerated Procedure well.

## 2017-05-11 NOTE — Discharge Instructions (Addendum)
Tylenol 1,000mg  given at 12:00p    [employer] Phone Number [phone]  BREAST BIOPSY/ PARTIAL MASTECTOMY: POST OP INSTRUCTIONS  Always review your discharge instruction sheet given to you by the facility where your surgery was performed.  IF YOU HAVE DISABILITY OR FAMILY LEAVE FORMS, YOU MUST BRING THEM TO THE OFFICE FOR PROCESSING.  DO NOT GIVE THEM TO YOUR DOCTOR.  1. A prescription for pain medication may be given to you upon discharge.  Take your pain medication as prescribed, if needed.  If narcotic pain medicine is not needed, then you may take acetaminophen (Tylenol) or ibuprofen (Advil) as needed. 2. Take your usually prescribed medications unless otherwise directed 3. If you need a refill on your pain medication, please contact your pharmacy.  They will contact our office to request authorization.  Prescriptions will not be filled after 5pm or on week-ends. 4. You should eat very light the first 24 hours after surgery, such as soup, crackers, pudding, etc.  Resume your normal diet the day after surgery. 5. Most patients will experience some swelling and bruising in the breast.  Ice packs and a good support bra will help.  Swelling and bruising can take several days to resolve.  6. It is common to experience some constipation if taking pain medication after surgery.  Increasing fluid intake and taking a stool softener will usually help or prevent this problem from occurring.  A mild laxative (Milk of Magnesia or Miralax) should be taken according to package directions if there are no bowel movements after 48 hours. 7. Unless discharge instructions indicate otherwise, you may remove your bandages 48 hours after surgery, and you may shower at that time.  You may have steri-strips (small skin tapes) in place directly over the incision.  These strips should be left on the skin for 7-10 days.   Any sutures or staples will be removed at the office during your follow-up  visit. 8. ACTIVITIES:  You may resume regular daily activities (gradually increasing) beginning the next day.  Wearing a good support bra or sports bra (or the breast binder) minimizes pain and swelling.  You may have sexual intercourse when it is comfortable. a. You may drive when you no longer are taking prescription pain medication, you can comfortably wear a seatbelt, and you can safely maneuver your car and apply brakes. b. RETURN TO WORK:  __________1 week_______________ 9. You should see your doctor in the office for a follow-up appointment approximately two weeks after your surgery.  Your doctors nurse will typically make your follow-up appointment when she calls you with your pathology report.  Expect your pathology report 2-3 business days after your surgery.  You may call to check if you do not hear from [place] after three days.   WHEN TO CALL YOUR DOCTOR: 1. Fever over 101.0 2. Nausea and/or vomiting. 3. Extreme swelling or bruising. 4. Continued bleeding from incision. 5. Increased pain, redness, or drainage from the incision.  The clinic staff is available to answer your questions during regular business hours.  Please dont hesitate to call and ask to speak to one of the nurses for clinical concerns.  If you have a medical emergency, go to the nearest emergency room or call 911.  A surgeon from [facility] is always on call at the hospital.  For further questions, please visit [email or web address]    Post Anesthesia Home Care Instructions  Activity: Get plenty of rest for the remainder of the day. A  responsible individual must stay with you for 24 hours following the procedure.  For the next 24 hours, DO NOT: -Drive a car -Paediatric nurse -Drink alcoholic beverages -Take any medication unless instructed by your physician -Make any legal decisions or sign important papers.  Meals: Start with liquid foods such as gelatin or soup. Progress to regular foods  as tolerated. Avoid greasy, spicy, heavy foods. If nausea and/or vomiting occur, drink only clear liquids until the nausea and/or vomiting subsides. Call your physician if vomiting continues.  Special Instructions/Symptoms: Your throat may feel dry or sore from the anesthesia or the breathing tube placed in your throat during surgery. If this causes discomfort, gargle with warm salt water. The discomfort should disappear within 24 hours.  If you had a scopolamine patch placed behind your ear for the management of post- operative nausea and/or vomiting:  1. The medication in the patch is effective for 72 hours, after which it should be removed.  Wrap patch in a tissue and discard in the trash. Wash hands thoroughly with soap and water. 2. You may remove the patch earlier than 72 hours if you experience unpleasant side effects which may include dry mouth, dizziness or visual disturbances. 3. Avoid touching the patch. Wash your hands with soap and water after contact with the patch.

## 2017-05-11 NOTE — Anesthesia Preprocedure Evaluation (Addendum)
Anesthesia Evaluation  Patient identified by MRN, date of birth, ID band Patient awake    Reviewed: Allergy & Precautions, NPO status , Patient's Chart, lab work & pertinent test results  History of Anesthesia Complications (+) history of anesthetic complications ("hard to wake up, itching")  Airway Mallampati: II  TM Distance: >3 FB Neck ROM: Full    Dental  (+) Dental Advisory Given, Partial Upper   Pulmonary neg pulmonary ROS,    Pulmonary exam normal breath sounds clear to auscultation       Cardiovascular hypertension, Pt. on medications Normal cardiovascular exam+ Valvular Problems/Murmurs MVP  Rhythm:Regular Rate:Normal     Neuro/Psych PSYCHIATRIC DISORDERS Anxiety S/p ACDF    GI/Hepatic Neg liver ROS, GERD  Medicated,  Endo/Other  Obesity   Renal/GU negative Renal ROS     Musculoskeletal  (+) Arthritis , Osteoarthritis,    Abdominal   Peds  Hematology negative hematology ROS (+)   Anesthesia Other Findings Day of surgery medications reviewed with the patient.  Right breast cancer  Reproductive/Obstetrics                            Anesthesia Physical Anesthesia Plan  ASA: II  Anesthesia Plan: General   Post-op Pain Management:  Regional for Post-op pain   Induction: Intravenous  PONV Risk Score and Plan: 3 and Midazolam, Dexamethasone and Ondansetron  Airway Management Planned: Oral ETT  Additional Equipment:   Intra-op Plan:   Post-operative Plan: Extubation in OR  Informed Consent: I have reviewed the patients History and Physical, chart, labs and discussed the procedure including the risks, benefits and alternatives for the proposed anesthesia with the patient or authorized representative who has indicated his/her understanding and acceptance.   Dental advisory given  Plan Discussed with: CRNA  Anesthesia Plan Comments: (Risks/benefits of general anesthesia  discussed with patient including risk of damage to teeth, lips, gum, and tongue, nausea/vomiting, allergic reactions to medications, and the possibility of heart attack, stroke and death.  All patient questions answered.  Patient wishes to proceed.)       Anesthesia Quick Evaluation

## 2017-05-11 NOTE — Interval H&P Note (Signed)
History and Physical Interval Note:  05/11/2017 12:48 PM  Felicia Bauer  has presented today for surgery, with the diagnosis of right breast cancer  The various methods of treatment have been discussed with the patient and family. After consideration of risks, benefits and other options for treatment, the patient has consented to  Procedure(s) with comments: RIGHT BRACKETED [place] (Right) - PEC BLOCK as a surgical intervention .  The patient's history has been reviewed, patient examined, no change in status, stable for surgery.  I have reviewed the patient's chart and labs.  Questions were answered to the patient's satisfaction.     [name]

## 2017-05-11 NOTE — Anesthesia Procedure Notes (Signed)
Procedure Name: Intubation Date/Time: 05/11/2017 1:24 PM Performed by: Willa Frater, CRNA Pre-anesthesia Checklist: Patient identified, Emergency Drugs available, Suction available and Patient being monitored Patient Re-evaluated:Patient Re-evaluated prior to induction Oxygen Delivery Method: Circle system utilized Preoxygenation: Pre-oxygenation with 100% oxygen Induction Type: IV induction Ventilation: Mask ventilation without difficulty Laryngoscope Size: Mac and 3 Grade View: Grade II Tube type: Oral Tube size: 7.0 mm Number of attempts: 1 Airway Equipment and Method: Stylet and Oral airway Placement Confirmation: ETT inserted through vocal cords under direct vision,  positive ETCO2 and breath sounds checked- equal and bilateral Secured at: 22 cm Tube secured with: Tape Dental Injury: Teeth and Oropharynx as per pre-operative assessment

## 2017-05-11 NOTE — H&P (Signed)
Felicia Bauer 05/09/2017 2:09 PM Location: [facility] Patient #: [id number] DOB: 1961/04/14 Separated / Language: Felicia Bauer / Race: Black or African American Female   History of Present Illness [name] MD; 05/09/2017 2:50 PM) The patient is a 56 year old female who presents for a follow-up for Breast cancer. Pt is a 56 yo F who was diagnosed with right breast cancer 03/2017. She presented with a screening detected right breast mass. Diagnostic imaging showed a 1-1.4 cm mass at 12 o'clock (mass 1 cm, mass + calcs 1.4 cm). There was also a 5 cm area of calcifications from 11-12 o'clock. Both areas were biopsied. Clips were 9.2 cm apart. 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 The calcifications were DCIS, +/+. She has no family history of breast cancer, but her mother had colon cancer. She had menarche at age 89, and she is G41P2 with first child in late teens.   She had an MRI 11/9 due to the multifocal nature of the cancer to determine extent of disease. The mass was slightly larger on MRI, but the DCIS was not seen. She saw Dr. Iran Planas and is planning on delayed reduction (around 1-2 weeks post partial mastectomy). We would like to make sure margins are negative prior to rearrangement of tissue. She just wanted to review surgery.   MRI 04/21/17 IMPRESSION: 1. 2.8 x 2.0 x 0.5 cm biopsy-proven invasive ductal carcinoma and ductal carcinoma in situ in the posterior aspect of the upper-outer quadrant of the right breast. 2. No mass or abnormal enhancement at the location of the recently biopsied low-grade ductal carcinoma in situ with calcifications and atypical lobular hyperplasia in the central aspect of the upper-outer quadrant of the right breast. 3. No evidence of malignancy on the left.   dx mammo/us 04/07/2017 ACR Breast Density Category c: The breast tissue is heterogeneously dense, which may obscure small  masses.  FINDINGS: On today's additional views with spot compression and 3D tomosynthesis, an irregular mass is confirmed in the upper right breast, 12 o'clock axis region, at posterior depth, with associated architectural distortion and pleomorphic calcifications. The mass measures 1 cm greatest dimension. The mass and associated microcalcifications measure 1.4 cm.  There are additional punctate and coarse heterogeneous calcifications in the upper right breast, 11-12 o'clock axis region, at middle depth, spanning approximately 5 cm as measured on a CC magnification view, most suspicious calcifications are best seen on the CC magnification views.  Mammographic images were processed with CAD.  Targeted ultrasound is performed, showing no sonographic correlate for the right breast mass. Ultrasound was also performed of the upper-outer quadrant of the right breast corresponding to the area of calcifications showing only normal dense fibroglandular tissues and no suspicious solid or cystic masses.  Right axilla was evaluated with ultrasound showing no enlarged or morphologically abnormal lymph nodes.  IMPRESSION: 1. Highly suspicious mass within the upper right breast, 12 to 12:30 o'clock axis, at posterior depth, with associated architectural distortion and pleomorphic calcifications. The mass measures 1 cm greatest dimension. The mass and associated microcalcifications measure 1.4 cm. No sonographic correlate identified. Recommend stereotactic biopsy using 3D tomosynthesis guidance. 2. Additional punctate and coarse heterogeneous calcifications within the upper outer quadrant of the right breast, 11-12 o'clock axis region, at middle depth, with a suspicious segmental distribution, spanning approximately 5 cm, the most suspicious calcifications best seen on the CC magnification views. Recommend additional stereotactic biopsy for these right breast calcifications to exclude  multifocal/multicentric  disease and/or define extent of disease.  pathology 04/12/2017 Diagnosis 1. Breast, right, needle core biopsy, lateral aspect of upper outer quadrant, mass with calcs - INVASIVE DUCTAL CARCINOMA. - DUCTAL CARCINOMA IN SITU WITH CALCIFICATIONS. - SEE COMMENT. 2. Breast, right, needle core biopsy, loosely grouped calcs in more central, upper outer quadrant - DUCTAL CARCINOMA IN SITU WITH CALCIFICATIONS. - LOBULAR NEOPLASIA (ATYPICAL LOBULAR HYPERPLASIA). - FIBROCYSTIC CHANGES WITH ADENOSIS AND CALCIFICATIONS. - SEE COMMENT.  CBC, CMET normal.    Allergies (Tanisha A. Owens Shark, [place]; 05/09/2017 2:09 PM) No Known Drug Allergies 04/25/2017 Allergies Reconciled   Medication History (Tanisha A. Owens Shark, [place]; 05/09/2017 2:09 PM) Aspirin Low Strength (81MG  Tablet Chewable, Oral) Active. Ondansetron (8MG  Tablet Disint, Oral) Active. Tamsulosin HCl (0.4MG  Capsule, Oral) Active. Valium (2MG  Tablet, Oral) Active. HydroCHLOROthiazide (25MG  Tablet, Oral) Active. ClonazePAM (1MG  Tablet, Oral) Active. DULoxetine HCl (30MG  Capsule DR Part, Oral) Active. Protonix (40MG  Tablet DR, Oral) Active. Gabapentin (600MG  Tablet, Oral) Active. Medications Reconciled    Review of Systems [name] MD; 05/09/2017 2:50 PM) All other systems negative  Vitals (Tanisha A. Brown RMA; 05/09/2017 2:10 PM) 05/09/2017 2:09 PM Weight: 197.2 lb Height: 62in Body Surface Area: 1.9 m Body Mass Index: 36.07 kg/m  Temp.: 98.56F  Pulse: 97 (Regular)  BP: 146/82 (Sitting, Left Arm, Standard)       Physical Exam [name] MD; 05/09/2017 2:51 PM) The physical exam findings are as follows: Note:not performed, entire visit counseling.    Assessment & Plan [name] MD; 05/09/2017 2:53 PM) MALIGNANT NEOPLASM OF UPPER-OUTER QUADRANT OF RIGHT BREAST IN FEMALE, ESTROGEN RECEPTOR POSITIVE (C50.411) Impression: Reviewed this week's surgical plan. Discussed  rationale of reconstruction 1-2 weeks later after pathology returns. Reviewed risk of positive margin. Discussed communication strategy.  Made sure follow up appts arranged. Discussed recovery time and time off work. Current Plans Follow up with [place] in the office in 2 weeks.  Call us sooner as needed.    Signed by [name], MD (05/09/2017 2:53 PM)

## 2017-05-11 NOTE — Anesthesia Procedure Notes (Signed)
Anesthesia Regional Block: Pectoralis block   Pre-Anesthetic Checklist: ,, timeout performed, Correct Patient, Correct Site, Correct Laterality, Correct Procedure, Correct Position, site marked, Risks and benefits discussed,  Surgical consent,  Pre-op evaluation,  At surgeon's request and post-op pain management  Laterality: Right  Prep: chloraprep       Needles:  Injection technique: Single-shot  Needle Type: Echogenic Needle     Needle Length: 9cm  Needle Gauge: 21     Additional Needles:   Procedures:,,,, ultrasound used (permanent image in chart),,,,  Narrative:  Start time: 05/11/2017 12:41 PM End time: 05/11/2017 12:46 PM Injection made incrementally with aspirations every 5 mL.  Performed by: Personally  Anesthesiologist: Catalina Gravel, MD  Additional Notes: No pain on injection. No increased resistance to injection. Injection made in 5cc increments.  Good needle visualization.  Patient tolerated procedure well.

## 2017-05-11 NOTE — Transfer of Care (Signed)
Immediate Anesthesia Transfer of Care Note  Patient: Felicia Bauer  Procedure(s) Performed: RIGHT BRACKETED RADIOACTIVE SEED GUIDED PARTIAL MASTECTOMY WITH  SENTINEL LYMPH NODE BIOPSY WITH ERAS PATHWAY (Right Breast)  Patient Location: PACU  Anesthesia Type:General  Level of Consciousness: awake, alert  and drowsy  Airway & Oxygen Therapy: Patient Spontanous Breathing and Patient connected to face mask oxygen  Post-op Assessment: Report given to RN and Post -op Vital signs reviewed and stable  Post vital signs: Reviewed and stable  Last Vitals:  Vitals:   05/11/17 1252 05/11/17 1253  BP:  (!) 142/80  Pulse: 80 87  Resp: 18 (!) 24  Temp:    SpO2: 100% 100%    Last Pain:  Vitals:   05/11/17 1223  TempSrc: Oral         Complications: No apparent anesthesia complications

## 2017-05-11 NOTE — Op Note (Signed)
Right Breast Radioactive bracketed/dual seed localized partial mastectomy and sentinel lymph node mapping and biopsy  Indications: This patient presents with history of right breast cancer with mcT2N0, grade 1, +/+/- upper outer quadrant.    Pre-operative Diagnosis: Right breast cancer  Post-operative Diagnosis: Same  Surgeon: [name]   Anesthesia: General endotracheal anesthesia  ASA Class: 2  Procedure Details  The patient was seen in the Holding Room. The risks, benefits, complications, treatment options, and expected outcomes were discussed with the patient. The possibilities of bleeding, infection, the need for additional procedures, failure to diagnose a condition, and creating a complication requiring transfusion or operation were discussed with the patient. The patient concurred with the proposed plan, giving informed consent.  The site of surgery properly noted/marked. The patient was taken to Operating Room # 7, identified, and the procedure verified as right Breast seed bracketed lumpectomy with sentinel lymph node biopsy. . A Time Out was held and the above information confirmed.  2 mL methylene blue was injected into the subareolar location of the breast.    The right arm, breast, and chest were prepped and draped in standard fashion. An additional timeout was performed as the surgeon left the room after injection to scrub.  The partial mastectomy was performed by creating an angled incision in the upper outer quadrant of the breast over the previously placed radioactive seeds.  Dissection was carried down to around the point of maximum signal intensity laterally. The cautery was used to perform the dissection.  Hemostasis was achieved with cautery.   The second clip was identified and additional tissue was taken medially.  The second clip was far posterior near the chest wall.  The second portion formed the medial margin of the first segment.  The pectoralis fascia was the  posterior margin.  The edges of the cavity were marked with large clips, with one each medial, lateral, inferior and superior, and two clips posteriorly.   Both specimens were inked with the margin marker paint kit.    Specimen radiography confirmed inclusion of the mammographic lesion, the clip, and the seed.  The background signal in the breast was zero.  The wound was irrigated and closed with 3-0 vicryl in layers and 4-0 monocryl subcuticular suture.    Using a hand-held gamma probe, right axillary sentinel nodes were identified transcutaneously.  An oblique incision was created below the axillary hairline.  Dissection was carried through the clavipectoral fascia.  Six level 2 axillary sentinel nodes were removed.  Counts per second were 350 for the highest node.    The background count was 0 cps.  The wound was irrigated.  Hemostasis was achieved with cautery.  The axillary incision was closed with a 3-0 vicryl deep dermal interrupted sutures and a 4-0 monocryl subcuticular closure.    Sterile dressings were applied. At the end of the operation, all sponge, instrument, and needle counts were correct.  Findings: grossly clear surgical margins and no adenopathy  Estimated Blood Loss:  min         Specimens: Right breast far lateral tissue with seed, right breast upper outer quadrant medial tissue with seed and six right axillary sentinel lymph nodes.             Complications:  None; patient tolerated the procedure well.         Disposition: PACU - hemodynamically stable.         Condition: stable

## 2017-05-11 NOTE — Progress Notes (Signed)
Emotional support given during injections

## 2017-05-12 ENCOUNTER — Encounter (HOSPITAL_BASED_OUTPATIENT_CLINIC_OR_DEPARTMENT_OTHER): Payer: Self-pay | Admitting: General Surgery

## 2017-05-12 NOTE — Anesthesia Postprocedure Evaluation (Signed)
Anesthesia Post Note  Patient: Felicia Bauer  Procedure(s) Performed: RIGHT BRACKETED RADIOACTIVE SEED GUIDED PARTIAL MASTECTOMY WITH  SENTINEL LYMPH NODE BIOPSY WITH ERAS PATHWAY (Right Breast)     Patient location during evaluation: PACU Anesthesia Type: General Level of consciousness: awake and alert Pain management: pain level controlled Vital Signs Assessment: post-procedure vital signs reviewed and stable Respiratory status: spontaneous breathing, nonlabored ventilation and respiratory function stable Cardiovascular status: blood pressure returned to baseline and stable Postop Assessment: no apparent nausea or vomiting Anesthetic complications: no    Last Vitals:  Vitals:   05/11/17 1715 05/11/17 1730  BP:    Pulse: 98 98  Resp:    Temp:  36.8 C  SpO2: 99% 100%    Last Pain:  Vitals:   05/11/17 1600  TempSrc:   PainSc: 0-No pain                 Catalina Gravel

## 2017-05-16 NOTE — Progress Notes (Signed)
Please let patient know margins and LN are negative.

## 2017-05-17 ENCOUNTER — Telehealth: Payer: Self-pay | Admitting: *Deleted

## 2017-05-17 NOTE — Telephone Encounter (Signed)
Received order per Dr. Burr Medico for oncotype testing. Requisition sent to pathology. Received by Lynelle Smoke

## 2017-05-19 ENCOUNTER — Other Ambulatory Visit: Payer: Self-pay

## 2017-05-19 ENCOUNTER — Encounter (HOSPITAL_BASED_OUTPATIENT_CLINIC_OR_DEPARTMENT_OTHER): Payer: Self-pay | Admitting: *Deleted

## 2017-05-19 ENCOUNTER — Encounter (HOSPITAL_BASED_OUTPATIENT_CLINIC_OR_DEPARTMENT_OTHER)
Admission: RE | Admit: 2017-05-19 | Discharge: 2017-05-19 | Disposition: A | Discharge status: Home / Self Care | Payer: 59 | Source: Ambulatory Visit | Attending: Plastic Surgery | Admitting: Plastic Surgery

## 2017-05-19 DIAGNOSIS — Z01812 Encounter for preprocedural laboratory examination: Secondary | ICD-10-CM | POA: Insufficient documentation

## 2017-05-19 LAB — BASIC METABOLIC PANEL
Anion gap: 8 (ref 5–15)
BUN: 12 mg/dL (ref 6–20)
CHLORIDE: 102 mmol/L (ref 101–111)
CO2: 27 mmol/L (ref 22–32)
Calcium: 9.1 mg/dL (ref 8.9–10.3)
Creatinine, Ser: 0.63 mg/dL (ref 0.44–1.00)
GFR calc Af Amer: >60 mL/min (ref >60)
GFR calc non Af Amer: >60 mL/min (ref >60)
Glucose, Bld: 84 mg/dL (ref 65–99)
POTASSIUM: 3.7 mmol/L (ref 3.5–5.1)
SODIUM: 137 mmol/L (ref 135–145)

## 2017-05-19 NOTE — Progress Notes (Signed)
Pt in for PAT appt, Ensure given and instructions reviewed.

## 2017-05-23 DIAGNOSIS — Z170 Estrogen receptor positive status [ER+]: Secondary | ICD-10-CM | POA: Diagnosis not present

## 2017-05-23 DIAGNOSIS — C50411 Malignant neoplasm of upper-outer quadrant of right female breast: Secondary | ICD-10-CM | POA: Diagnosis not present

## 2017-05-24 ENCOUNTER — Encounter (HOSPITAL_COMMUNITY): Payer: Self-pay

## 2017-05-24 ENCOUNTER — Telehealth: Payer: Self-pay | Admitting: *Deleted

## 2017-05-24 NOTE — Telephone Encounter (Signed)
Received oncotype score of 12/3%. Physician team notified. Called pt with results and discussed she does not need chemotherapy and that her next step will be xrt after reduction on 12/14. Received verbal understanding.

## 2017-05-25 ENCOUNTER — Other Ambulatory Visit: Payer: Self-pay | Admitting: Radiation Oncology

## 2017-05-25 DIAGNOSIS — Z170 Estrogen receptor positive status [ER+]: Principal | ICD-10-CM

## 2017-05-25 DIAGNOSIS — C50411 Malignant neoplasm of upper-outer quadrant of right female breast: Secondary | ICD-10-CM

## 2017-05-26 ENCOUNTER — Encounter (HOSPITAL_COMMUNITY): Payer: Self-pay | Admitting: Emergency Medicine

## 2017-05-26 ENCOUNTER — Encounter (HOSPITAL_BASED_OUTPATIENT_CLINIC_OR_DEPARTMENT_OTHER): Payer: Self-pay

## 2017-05-26 ENCOUNTER — Other Ambulatory Visit: Payer: Self-pay

## 2017-05-26 ENCOUNTER — Encounter (HOSPITAL_BASED_OUTPATIENT_CLINIC_OR_DEPARTMENT_OTHER)
Admission: RE | Disposition: A | Discharge status: Home / Self Care | Payer: Self-pay | Source: Ambulatory Visit | Attending: Plastic Surgery

## 2017-05-26 ENCOUNTER — Ambulatory Visit (HOSPITAL_BASED_OUTPATIENT_CLINIC_OR_DEPARTMENT_OTHER): Payer: 59 | Admitting: Certified Registered Nurse Anesthetist

## 2017-05-26 ENCOUNTER — Ambulatory Visit (HOSPITAL_BASED_OUTPATIENT_CLINIC_OR_DEPARTMENT_OTHER)
Admission: RE | Admit: 2017-05-26 | Discharge: 2017-05-26 | Disposition: A | Discharge status: Home / Self Care | Payer: 59 | Source: Ambulatory Visit | Attending: Plastic Surgery | Admitting: Plastic Surgery

## 2017-05-26 ENCOUNTER — Observation Stay (HOSPITAL_COMMUNITY)
Admission: EM | Admit: 2017-05-26 | Discharge: 2017-05-27 | Disposition: A | Discharge status: Home / Self Care | Payer: 59 | Attending: Plastic Surgery | Admitting: Plastic Surgery

## 2017-05-26 DIAGNOSIS — M542 Cervicalgia: Secondary | ICD-10-CM | POA: Diagnosis not present

## 2017-05-26 DIAGNOSIS — C50411 Malignant neoplasm of upper-outer quadrant of right female breast: Secondary | ICD-10-CM

## 2017-05-26 DIAGNOSIS — Z79899 Other long term (current) drug therapy: Secondary | ICD-10-CM | POA: Insufficient documentation

## 2017-05-26 DIAGNOSIS — Z6837 Body mass index (BMI) 37.0-37.9, adult: Secondary | ICD-10-CM | POA: Insufficient documentation

## 2017-05-26 DIAGNOSIS — C50412 Malignant neoplasm of upper-outer quadrant of left female breast: Principal | ICD-10-CM | POA: Insufficient documentation

## 2017-05-26 DIAGNOSIS — F419 Anxiety disorder, unspecified: Secondary | ICD-10-CM | POA: Diagnosis not present

## 2017-05-26 DIAGNOSIS — Z7982 Long term (current) use of aspirin: Secondary | ICD-10-CM | POA: Diagnosis not present

## 2017-05-26 DIAGNOSIS — M1990 Unspecified osteoarthritis, unspecified site: Secondary | ICD-10-CM | POA: Diagnosis not present

## 2017-05-26 DIAGNOSIS — M549 Dorsalgia, unspecified: Secondary | ICD-10-CM | POA: Diagnosis not present

## 2017-05-26 DIAGNOSIS — I10 Essential (primary) hypertension: Secondary | ICD-10-CM | POA: Insufficient documentation

## 2017-05-26 DIAGNOSIS — G959 Disease of spinal cord, unspecified: Secondary | ICD-10-CM | POA: Diagnosis not present

## 2017-05-26 DIAGNOSIS — N62 Hypertrophy of breast: Secondary | ICD-10-CM | POA: Insufficient documentation

## 2017-05-26 DIAGNOSIS — Z170 Estrogen receptor positive status [ER+]: Secondary | ICD-10-CM

## 2017-05-26 DIAGNOSIS — E669 Obesity, unspecified: Secondary | ICD-10-CM | POA: Diagnosis not present

## 2017-05-26 DIAGNOSIS — C50912 Malignant neoplasm of unspecified site of left female breast: Secondary | ICD-10-CM | POA: Diagnosis not present

## 2017-05-26 DIAGNOSIS — N6011 Diffuse cystic mastopathy of right breast: Secondary | ICD-10-CM | POA: Diagnosis not present

## 2017-05-26 DIAGNOSIS — N6012 Diffuse cystic mastopathy of left breast: Secondary | ICD-10-CM | POA: Diagnosis not present

## 2017-05-26 HISTORY — PX: BREAST REDUCTION SURGERY: SHX8

## 2017-05-26 LAB — POCT HEMOGLOBIN-HEMACUE: Hemoglobin: 10.6 g/dL — ABNORMAL LOW (ref 12.0–15.0)

## 2017-05-26 SURGERY — MAMMOPLASTY, REDUCTION
Anesthesia: General | Site: Breast | Laterality: Bilateral

## 2017-05-26 MED ORDER — PROPOFOL 10 MG/ML IV BOLUS
INTRAVENOUS | Status: DC | PRN
  Administered 2017-05-26: 40 mg via INTRAVENOUS
  Administered 2017-05-26: 160 mg via INTRAVENOUS

## 2017-05-26 MED ORDER — LIDOCAINE 2% (20 MG/ML) 5 ML SYRINGE
INTRAMUSCULAR | Status: DC | PRN
  Administered 2017-05-26: 80 mg via INTRAVENOUS

## 2017-05-26 MED ORDER — ROCURONIUM BROMIDE 50 MG/5ML IV SOSY
PREFILLED_SYRINGE | INTRAVENOUS | Status: DC | PRN
  Administered 2017-05-26: 50 mg via INTRAVENOUS

## 2017-05-26 MED ORDER — HYDROCODONE-ACETAMINOPHEN 5-325 MG PO TABS
1.0000 | ORAL_TABLET | ORAL | Status: DC | PRN
  Administered 2017-05-26 – 2017-05-27 (×3): 2 via ORAL
  Filled 2017-05-26 (×4): qty 2

## 2017-05-26 MED ORDER — ONDANSETRON 4 MG PO TBDP: 4.0000 mg | ORAL_TABLET | Freq: Four times a day (QID) | ORAL | Status: DC | PRN

## 2017-05-26 MED ORDER — EPHEDRINE 5 MG/ML INJ
INTRAVENOUS | Status: AC
  Filled 2017-05-26: qty 10

## 2017-05-26 MED ORDER — SCOPOLAMINE 1 MG/3DAYS TD PT72: 1.0000 | MEDICATED_PATCH | Freq: Once | TRANSDERMAL | Status: DC | PRN

## 2017-05-26 MED ORDER — METHOCARBAMOL 500 MG PO TABS
500.0000 mg | ORAL_TABLET | Freq: Three times a day (TID) | ORAL | Status: DC | PRN
  Administered 2017-05-26: 500 mg via ORAL
  Filled 2017-05-26: qty 1

## 2017-05-26 MED ORDER — KETOROLAC TROMETHAMINE 30 MG/ML IJ SOLN
INTRAMUSCULAR | Status: DC | PRN
  Administered 2017-05-26: 30 mg via INTRAVENOUS

## 2017-05-26 MED ORDER — HYDROCODONE-ACETAMINOPHEN 5-325 MG PO TABS: 1.0000 | ORAL_TABLET | ORAL | 0 refills | Status: AC | PRN

## 2017-05-26 MED ORDER — GABAPENTIN 300 MG PO CAPS: 300.0000 mg | ORAL_CAPSULE | ORAL | Status: DC

## 2017-05-26 MED ORDER — SUGAMMADEX SODIUM 200 MG/2ML IV SOLN
INTRAVENOUS | Status: DC | PRN
Start: 2017-05-26 — End: 2017-05-26
  Administered 2017-05-26: 200 mg via INTRAVENOUS

## 2017-05-26 MED ORDER — HEPARIN SODIUM (PORCINE) 5000 UNIT/ML IJ SOLN
INTRAMUSCULAR | Status: AC
  Filled 2017-05-26: qty 1

## 2017-05-26 MED ORDER — FENTANYL CITRATE (PF) 100 MCG/2ML IJ SOLN
INTRAMUSCULAR | Status: AC
  Filled 2017-05-26: qty 2

## 2017-05-26 MED ORDER — LACTATED RINGERS IV SOLN
INTRAVENOUS | Status: DC
  Administered 2017-05-26 (×2): via INTRAVENOUS

## 2017-05-26 MED ORDER — DEXAMETHASONE SODIUM PHOSPHATE 10 MG/ML IJ SOLN
INTRAMUSCULAR | Status: AC
  Filled 2017-05-26: qty 1

## 2017-05-26 MED ORDER — HYDROCHLOROTHIAZIDE 25 MG PO TABS
25.0000 mg | ORAL_TABLET | Freq: Every day | ORAL | Status: DC
  Administered 2017-05-26 – 2017-05-27 (×2): 25 mg via ORAL
  Filled 2017-05-26 (×2): qty 1

## 2017-05-26 MED ORDER — BUPIVACAINE LIPOSOME 1.3 % IJ SUSP
INTRAMUSCULAR | Status: AC
  Filled 2017-05-26: qty 20

## 2017-05-26 MED ORDER — ONDANSETRON HCL 4 MG/2ML IJ SOLN
INTRAMUSCULAR | Status: AC
  Filled 2017-05-26: qty 2

## 2017-05-26 MED ORDER — CHLORHEXIDINE GLUCONATE CLOTH 2 % EX PADS: 6.0000 | MEDICATED_PAD | Freq: Once | CUTANEOUS | Status: DC

## 2017-05-26 MED ORDER — ROCURONIUM BROMIDE 10 MG/ML (PF) SYRINGE
PREFILLED_SYRINGE | INTRAVENOUS | Status: AC
  Filled 2017-05-26: qty 5

## 2017-05-26 MED ORDER — ONDANSETRON HCL 4 MG/2ML IJ SOLN: 4.0000 mg | Freq: Four times a day (QID) | INTRAMUSCULAR | Status: DC | PRN

## 2017-05-26 MED ORDER — FENTANYL CITRATE (PF) 100 MCG/2ML IJ SOLN
25.0000 ug | INTRAMUSCULAR | Status: DC | PRN
  Administered 2017-05-26: 50 ug via INTRAVENOUS
  Administered 2017-05-26 (×4): 25 ug via INTRAVENOUS

## 2017-05-26 MED ORDER — PHENYLEPHRINE 40 MCG/ML (10ML) SYRINGE FOR IV PUSH (FOR BLOOD PRESSURE SUPPORT)
PREFILLED_SYRINGE | INTRAVENOUS | Status: DC | PRN
  Administered 2017-05-26 (×2): 80 ug via INTRAVENOUS

## 2017-05-26 MED ORDER — OXYCODONE HCL 5 MG PO TABS
10.0000 mg | ORAL_TABLET | Freq: Once | ORAL | Status: AC
  Administered 2017-05-26: 10 mg via ORAL

## 2017-05-26 MED ORDER — OXYCODONE HCL 5 MG PO TABS
ORAL_TABLET | ORAL | Status: AC
  Filled 2017-05-26: qty 2

## 2017-05-26 MED ORDER — CEFAZOLIN SODIUM-DEXTROSE 2-4 GM/100ML-% IV SOLN
INTRAVENOUS | Status: AC
  Filled 2017-05-26: qty 100

## 2017-05-26 MED ORDER — HYDROMORPHONE HCL 1 MG/ML IJ SOLN
0.5000 mg | INTRAMUSCULAR | Status: DC | PRN
Start: 2017-05-26 — End: 2017-05-27
  Administered 2017-05-27: 0.5 mg via INTRAVENOUS
  Filled 2017-05-26: qty 1

## 2017-05-26 MED ORDER — SUGAMMADEX SODIUM 200 MG/2ML IV SOLN
INTRAVENOUS | Status: AC
  Filled 2017-05-26: qty 2

## 2017-05-26 MED ORDER — FENTANYL CITRATE (PF) 100 MCG/2ML IJ SOLN: 50.0000 ug | INTRAMUSCULAR | Status: DC | PRN

## 2017-05-26 MED ORDER — PHENYLEPHRINE HCL 10 MG/ML IJ SOLN
INTRAVENOUS | Status: DC | PRN
  Administered 2017-05-26: 25 ug/min via INTRAVENOUS

## 2017-05-26 MED ORDER — POTASSIUM CHLORIDE IN NACL 20-0.9 MEQ/L-% IV SOLN
INTRAVENOUS | Status: DC
  Administered 2017-05-26: 23:00:00 via INTRAVENOUS
  Filled 2017-05-26: qty 1000

## 2017-05-26 MED ORDER — CELECOXIB 200 MG PO CAPS
ORAL_CAPSULE | ORAL | Status: AC
  Filled 2017-05-26: qty 1

## 2017-05-26 MED ORDER — MIDAZOLAM HCL 2 MG/2ML IJ SOLN
INTRAMUSCULAR | Status: AC
  Filled 2017-05-26: qty 2

## 2017-05-26 MED ORDER — HEPARIN SODIUM (PORCINE) 5000 UNIT/ML IJ SOLN
5000.0000 [IU] | Freq: Once | INTRAMUSCULAR | Status: AC
  Administered 2017-05-26: 5000 [IU] via SUBCUTANEOUS

## 2017-05-26 MED ORDER — EPHEDRINE SULFATE-NACL 50-0.9 MG/10ML-% IV SOSY
PREFILLED_SYRINGE | INTRAVENOUS | Status: DC | PRN
  Administered 2017-05-26 (×3): 10 mg via INTRAVENOUS
  Administered 2017-05-26: 5 mg via INTRAVENOUS

## 2017-05-26 MED ORDER — KETOROLAC TROMETHAMINE 30 MG/ML IJ SOLN
INTRAMUSCULAR | Status: AC
  Filled 2017-05-26: qty 1

## 2017-05-26 MED ORDER — CEFAZOLIN SODIUM-DEXTROSE 2-4 GM/100ML-% IV SOLN
2.0000 g | INTRAVENOUS | Status: AC
  Administered 2017-05-26: 2 g via INTRAVENOUS

## 2017-05-26 MED ORDER — PHENYLEPHRINE 40 MCG/ML (10ML) SYRINGE FOR IV PUSH (FOR BLOOD PRESSURE SUPPORT)
PREFILLED_SYRINGE | INTRAVENOUS | Status: AC
  Filled 2017-05-26: qty 10

## 2017-05-26 MED ORDER — ACETAMINOPHEN 500 MG PO TABS
ORAL_TABLET | ORAL | Status: AC
  Filled 2017-05-26: qty 2

## 2017-05-26 MED ORDER — PROPOFOL 10 MG/ML IV BOLUS
INTRAVENOUS | Status: AC
  Filled 2017-05-26: qty 40

## 2017-05-26 MED ORDER — METHOCARBAMOL 1000 MG/10ML IJ SOLN
1000.0000 mg | Freq: Once | INTRAMUSCULAR | Status: AC
  Administered 2017-05-26: 1000 mg via INTRAVENOUS

## 2017-05-26 MED ORDER — LIDOCAINE 2% (20 MG/ML) 5 ML SYRINGE
INTRAMUSCULAR | Status: AC
  Filled 2017-05-26: qty 5

## 2017-05-26 MED ORDER — ONDANSETRON HCL 4 MG/2ML IJ SOLN
INTRAMUSCULAR | Status: DC | PRN
  Administered 2017-05-26: 4 mg via INTRAVENOUS

## 2017-05-26 MED ORDER — PROMETHAZINE HCL 25 MG/ML IJ SOLN: 6.2500 mg | INTRAMUSCULAR | Status: DC | PRN

## 2017-05-26 MED ORDER — SODIUM CHLORIDE 0.9 % IJ SOLN
INTRAMUSCULAR | Status: AC
  Filled 2017-05-26: qty 20

## 2017-05-26 MED ORDER — FENTANYL CITRATE (PF) 100 MCG/2ML IJ SOLN
INTRAMUSCULAR | Status: DC | PRN
  Administered 2017-05-26: 100 ug via INTRAVENOUS
  Administered 2017-05-26: 50 ug via INTRAVENOUS
  Administered 2017-05-26 (×2): 25 ug via INTRAVENOUS

## 2017-05-26 MED ORDER — FENTANYL CITRATE (PF) 100 MCG/2ML IJ SOLN
INTRAMUSCULAR | Status: AC
Start: 2017-05-26 — End: 2017-05-26
  Filled 2017-05-26: qty 2

## 2017-05-26 MED ORDER — GABAPENTIN 300 MG PO CAPS
ORAL_CAPSULE | ORAL | Status: AC
  Filled 2017-05-26: qty 1

## 2017-05-26 MED ORDER — ACETAMINOPHEN 500 MG PO TABS
1000.0000 mg | ORAL_TABLET | ORAL | Status: AC
  Administered 2017-05-26: 1000 mg via ORAL

## 2017-05-26 MED ORDER — 0.9 % SODIUM CHLORIDE (POUR BTL) OPTIME
TOPICAL | Status: DC | PRN
  Administered 2017-05-26: 1000 mL

## 2017-05-26 MED ORDER — DEXAMETHASONE SODIUM PHOSPHATE 10 MG/ML IJ SOLN
INTRAMUSCULAR | Status: DC | PRN
  Administered 2017-05-26: 10 mg via INTRAVENOUS

## 2017-05-26 MED ORDER — PHENYLEPHRINE HCL 10 MG/ML IJ SOLN
INTRAMUSCULAR | Status: AC
  Filled 2017-05-26: qty 1

## 2017-05-26 MED ORDER — MIDAZOLAM HCL 2 MG/2ML IJ SOLN
INTRAMUSCULAR | Status: DC | PRN
  Administered 2017-05-26: 2 mg via INTRAVENOUS

## 2017-05-26 MED ORDER — SODIUM CHLORIDE 0.9 % IV SOLN
INTRAVENOUS | Status: DC | PRN
  Administered 2017-05-26: 40 mL

## 2017-05-26 MED ORDER — MIDAZOLAM HCL 2 MG/2ML IJ SOLN: 1.0000 mg | INTRAMUSCULAR | Status: DC | PRN

## 2017-05-26 MED ORDER — CELECOXIB 200 MG PO CAPS
200.0000 mg | ORAL_CAPSULE | ORAL | Status: AC
  Administered 2017-05-26: 200 mg via ORAL

## 2017-05-26 SURGICAL SUPPLY — 59 items
ADH SKN CLS APL DERMABOND .7 (GAUZE/BANDAGES/DRESSINGS) ×5
APPLIER CLIP 9.375 MED OPEN (MISCELLANEOUS)
APR CLP MED 9.3 20 MLT OPN (MISCELLANEOUS)
BINDER BREAST 3XL (GAUZE/BANDAGES/DRESSINGS) IMPLANT
BINDER BREAST LRG (GAUZE/BANDAGES/DRESSINGS) IMPLANT
BINDER BREAST MEDIUM (GAUZE/BANDAGES/DRESSINGS) IMPLANT
BINDER BREAST XLRG (GAUZE/BANDAGES/DRESSINGS) IMPLANT
BINDER BREAST XXLRG (GAUZE/BANDAGES/DRESSINGS) ×2 IMPLANT
BLADE SURG 10 STRL SS (BLADE) ×10 IMPLANT
BNDG GAUZE ELAST 4 BULKY (GAUZE/BANDAGES/DRESSINGS) ×6 IMPLANT
CANISTER SUCT 1200ML W/VALVE (MISCELLANEOUS) ×3 IMPLANT
CHLORAPREP W/TINT 26ML (MISCELLANEOUS) ×3 IMPLANT
CLIP APPLIE 9.375 MED OPEN (MISCELLANEOUS) IMPLANT
CLIP VESOCCLUDE MED 6/CT (CLIP) IMPLANT
COVER BACK TABLE 60X90IN (DRAPES) ×3 IMPLANT
COVER MAYO STAND STRL (DRAPES) ×3 IMPLANT
DERMABOND ADVANCED (GAUZE/BANDAGES/DRESSINGS) ×10
DERMABOND ADVANCED .7 DNX12 (GAUZE/BANDAGES/DRESSINGS) ×1 IMPLANT
DRAIN CHANNEL 15F RND FF W/TCR (WOUND CARE) ×4 IMPLANT
DRAPE TOP ARMCOVERS (MISCELLANEOUS) ×3 IMPLANT
DRAPE U-SHAPE 76X120 STRL (DRAPES) ×3 IMPLANT
DRAPE UTILITY XL STRL (DRAPES) IMPLANT
DRSG PAD ABDOMINAL 8X10 ST (GAUZE/BANDAGES/DRESSINGS) ×6 IMPLANT
ELECT COATED BLADE 2.86 ST (ELECTRODE) ×3 IMPLANT
ELECT REM PT RETURN 9FT ADLT (ELECTROSURGICAL) ×3
ELECTRODE REM PT RTRN 9FT ADLT (ELECTROSURGICAL) ×1 IMPLANT
EVACUATOR SILICONE 100CC (DRAIN) ×4 IMPLANT
GLOVE BIO SURGEON STRL SZ 6 (GLOVE) ×6 IMPLANT
GLOVE BIOGEL PI IND STRL 7.0 (GLOVE) IMPLANT
GLOVE BIOGEL PI INDICATOR 7.0 (GLOVE) ×4
GLOVE SURG SS PI 6.5 STRL IVOR (GLOVE) ×2 IMPLANT
GOWN STRL REUS W/ TWL LRG LVL3 (GOWN DISPOSABLE) ×2 IMPLANT
GOWN STRL REUS W/TWL LRG LVL3 (GOWN DISPOSABLE) ×6
MARKER SKIN DUAL TIP RULER LAB (MISCELLANEOUS) IMPLANT
NDL HYPO 25X1 1.5 SAFETY (NEEDLE) IMPLANT
NEEDLE HYPO 25X1 1.5 SAFETY (NEEDLE) ×3 IMPLANT
NS IRRIG 1000ML POUR BTL (IV SOLUTION) ×3 IMPLANT
PACK BASIN DAY SURGERY FS (CUSTOM PROCEDURE TRAY) ×3 IMPLANT
PENCIL BUTTON HOLSTER BLD 10FT (ELECTRODE) ×3 IMPLANT
PIN SAFETY STERILE (MISCELLANEOUS) ×3 IMPLANT
SHEET MEDIUM DRAPE 40X70 STRL (DRAPES) ×3 IMPLANT
SLEEVE SCD COMPRESS KNEE MED (MISCELLANEOUS) ×3 IMPLANT
SPONGE LAP 18X18 X RAY DECT (DISPOSABLE) ×12 IMPLANT
STAPLER VISISTAT 35W (STAPLE) ×8 IMPLANT
SUT ETHILON 2 0 FS 18 (SUTURE) ×6 IMPLANT
SUT MNCRL AB 4-0 PS2 18 (SUTURE) ×12 IMPLANT
SUT PDS AB 2-0 CT2 27 (SUTURE) ×2 IMPLANT
SUT VIC AB 3-0 PS1 18 (SUTURE) ×18
SUT VIC AB 3-0 PS1 18XBRD (SUTURE) ×4 IMPLANT
SUT VICRYL 4-0 PS2 18IN ABS (SUTURE) ×6 IMPLANT
SYR BULB IRRIGATION 50ML (SYRINGE) ×3 IMPLANT
SYR CONTROL 10ML LL (SYRINGE) ×2 IMPLANT
TAPE MEASURE VINYL STERILE (MISCELLANEOUS) IMPLANT
TOWEL OR 17X24 6PK STRL BLUE (TOWEL DISPOSABLE) ×6 IMPLANT
TRAY FOLEY BAG SILVER LF 14FR (SET/KITS/TRAYS/PACK) IMPLANT
TUBE CONNECTING 20'X1/4 (TUBING) ×1
TUBE CONNECTING 20X1/4 (TUBING) ×2 IMPLANT
UNDERPAD 30X30 (UNDERPADS AND DIAPERS) ×6 IMPLANT
YANKAUER SUCT BULB TIP NO VENT (SUCTIONS) ×3 IMPLANT

## 2017-05-26 NOTE — Anesthesia Preprocedure Evaluation (Addendum)
Anesthesia Evaluation  Patient identified by MRN, date of birth, ID band Patient awake    Reviewed: Allergy & Precautions, NPO status , Patient's Chart, lab work & pertinent test results  History of Anesthesia Complications (+) history of anesthetic complications ("hard to wake up, itching post-op")  Airway Mallampati: II  TM Distance: >3 FB Neck ROM: Full    Dental  (+) Dental Advisory Given, Upper Dentures   Pulmonary neg pulmonary ROS,    Pulmonary exam normal breath sounds clear to auscultation       Cardiovascular hypertension, Pt. on medications Normal cardiovascular exam+ Valvular Problems/Murmurs MVP  Rhythm:Regular Rate:Normal     Neuro/Psych PSYCHIATRIC DISORDERS Anxiety S/p ACDF    GI/Hepatic Neg liver ROS, GERD  Medicated,  Endo/Other  Obesity    Renal/GU negative Renal ROS     Musculoskeletal  (+) Arthritis , Osteoarthritis,    Abdominal   Peds  Hematology negative hematology ROS (+)   Anesthesia Other Findings Day of surgery medications reviewed with the patient.  Right breast cancer  Reproductive/Obstetrics                            Anesthesia Physical Anesthesia Plan  ASA: II  Anesthesia Plan: General   Post-op Pain Management:    Induction: Intravenous  PONV Risk Score and Plan: 3 and Midazolam, Dexamethasone and Ondansetron  Airway Management Planned: Oral ETT  Additional Equipment:   Intra-op Plan:   Post-operative Plan: Extubation in OR  Informed Consent: I have reviewed the patients History and Physical, chart, labs and discussed the procedure including the risks, benefits and alternatives for the proposed anesthesia with the patient or authorized representative who has indicated his/her understanding and acceptance.   Dental advisory given  Plan Discussed with: CRNA  Anesthesia Plan Comments: (Risks/benefits of general anesthesia discussed with  patient including risk of damage to teeth, lips, gum, and tongue, nausea/vomiting, allergic reactions to medications, and the possibility of heart attack, stroke and death.  All patient questions answered.  Patient wishes to proceed.)        Anesthesia Quick Evaluation

## 2017-05-26 NOTE — Transfer of Care (Signed)
Immediate Anesthesia Transfer of Care Note  Patient: Felicia Bauer  Procedure(s) Performed: LEFT BREAST MAMMARY REDUCTION  (BREAST) AND RIGHT ONCOPLASTIC RECONSTRUCTION (Bilateral Breast)  Patient Location: PACU  Anesthesia Type:General  Level of Consciousness: awake, alert  and oriented  Airway & Oxygen Therapy: Patient Spontanous Breathing and Patient connected to face mask oxygen  Post-op Assessment: Report given to RN and Post -op Vital signs reviewed and stable  Post vital signs: Reviewed and stable  Last Vitals:  Vitals:   05/26/17 0642  BP: 138/76  Pulse: 87  Resp: 18  Temp: 37.2 C  SpO2: 99%    Last Pain:  Vitals:   05/26/17 0642  TempSrc: Oral         Complications: No apparent anesthesia complications

## 2017-05-26 NOTE — Discharge Instructions (Signed)
° ° ° °  JP Drain Totals °· Bring this sheet to all of your post-operative appointments while you have your drains. °· Please measure your drains by CC's or ML's. °· Make sure you drain and measure your JP Drains 2 or 3 times per day. °· At the end of each day, add up totals for the left side and add up totals for the right side. °   ( 9 am )     ( 3 pm )        ( 9 pm )                °Date L  R  L  R  L  R  Total L/R  °               °               °               °               °               °               °               °               °               °               °               °               ° ° ° °Post Anesthesia Home Care Instructions ° °Activity: °Get plenty of rest for the remainder of the day. A responsible individual must stay with you for 24 hours following the procedure.  °For the next 24 hours, DO NOT: °-Drive a car °-Operate machinery °-Drink alcoholic beverages °-Take any medication unless instructed by your physician °-Make any legal decisions or sign important papers. ° °Meals: °Start with liquid foods such as gelatin or soup. Progress to regular foods as tolerated. Avoid greasy, spicy, heavy foods. If nausea and/or vomiting occur, drink only clear liquids until the nausea and/or vomiting subsides. Call your physician if vomiting continues. ° °Special Instructions/Symptoms: °Your throat may feel dry or sore from the anesthesia or the breathing tube placed in your throat during surgery. If this causes discomfort, gargle with warm salt water. The discomfort should disappear within 24 hours. ° °If you had a scopolamine patch placed behind your ear for the management of post- operative nausea and/or vomiting: ° °1. The medication in the patch is effective for 72 hours, after which it should be removed.  Wrap patch in a tissue and discard in the trash. Wash hands thoroughly with soap and water. °2. You may remove the patch earlier than 72 hours if you experience unpleasant side effects which  may include dry mouth, dizziness or visual disturbances. °3. Avoid touching the patch. Wash your hands with soap and water after contact with the patch. °  ° °

## 2017-05-26 NOTE — ED Provider Notes (Signed)
The patient was seen in the emergency department, primarily by plastic surgery service.  She has not seen by emergency department provider staff.   Daleen Bo, MD 05/26/17 2116

## 2017-05-26 NOTE — ED Notes (Signed)
Dr. Iran Planas to come see patient in ED room 43

## 2017-05-26 NOTE — Op Note (Signed)
Operative Note   DATE OF OPERATION: 12.14.18  LOCATION: [facility]-outpatient  SURGICAL DIVISION: Plastic Surgery  PREOPERATIVE DIAGNOSES:  1. Left breast cancer UOQ 2. Macromastia 3. Chronic neck and back pain  POSTOPERATIVE DIAGNOSES:  same  PROCEDURE:  Right oncoplastic reconstruction, left breast reduction  SURGEON: Irene Limbo MD MBA  ASSISTANT: none  ANESTHESIA:  General.   EBL: 50 ml  COMPLICATIONS: None immediate.   INDICATIONS FOR PROCEDURE:  The patient, Felicia Bauer, is a 56 y.o. female born on June 11, 1961, is here for staged oncoplastic reconstruction following right lumpectomy.   FINDINGS: Evacuated 650 ml of old hematoma and seroma from right lumpectomy cavity. Right reduction 487 g, left 732 g  DESCRIPTION OF PROCEDURE:  The patient was marked standing in the preoperative area to mark sternal notch, chest midline, anterior axillary lines, inframammary folds. The location of new nipple areolar complex was marked at level of on inframammary fold on anterior surface breast by palpation. This was marked symmetric over bilateral breasts. With aid of Wise pattern marker, location of new nipple areolar complex and vertical limbs (8cm) were marked. The patient was taken to the operating room. SCDs were placed and IV antibiotics were given. The patient's operative site was prepped and draped in a sterile fashion. A time out was performed and all information was confirmed to be correct.   Over right breast, superomedial pedicle marked and nipple areolar complex marked with 45 mm diameter marker. Pedicle deepithlialized and developedto chest wall. Seroma and old hematoma noted within lumpectomy cavity. Lower pole breast tissue excised partially. An inferiorly based flap soft tissue was maintained and advanced superiorly into lumpectomy cavity and sutured to chest wall with interrupted 2-0 PDS.Medial and lateral flaps developed. Breast tailor tacked closed.  I  then directed attention to left breast where superomedial pedicle designed. Nipple areolar complex marked with 45 mm diameter marker. The pedicle was deepithelialized and developed to 5-6 cm thickness and toward chest wall until tension free closure obtained. Lower polebreast tissue, lateral chest wall, and superior pole breast tissue excised. Breast tailor tacked closed, and patient brought to upright sitting position and assessed for symmetry. Patent returned to supine position and breast cavities irrigated and hemostasis obtained. Exparel infiltrated throughout each breast. 15 Fr JP placed in each breast and secured with 2-0 nylon. Closure completed with 3-0 vicryl to approximate dermis along inframammary fold and vertical limb. NAC inset with 4-0 vicryl in dermis. Skin closure completed with 4-0 monocryl subcuticular throughout.Tissue adhesive applied. Dry dressing and breast binder applied.  The patient was allowed to wake from anesthesia, extubated and taken to the recovery room in satisfactory condition.   SPECIMENS: right and left breast reduction  DRAINS: 15 Fr JP in right and left breast  Irene Limbo, MD [facility] Plastic & Reconstructive Surgery [phone], pin [phone]

## 2017-05-26 NOTE — ED Notes (Addendum)
Dr. Iran Planas paged

## 2017-05-26 NOTE — H&P (Signed)
Subjective:     Patient ID: Felicia Bauer is a 56 y.o. female.  HPI  Patient of Drs Burr Medico and Barry Dienes here for consultation breast reconstruction. Presented following screening MMG with right breast mass with calcifications. MMG/US demonstrated 1 cm mass with a span of 1.4 cm calcifications at 12-12:30.; additional 1-12:00  5 cm area of calcifications. The right axilla was negative for adenopathy. Biopsy of mass with IDC, ER/PR +, HER2 -. 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  MRI showed 2.8 x 2.0 x 0.5 cm biopsy-proven IDC/DCIS  in the posterior aspect of the right UOQ. No mass or abnormal enhancement at the location of the biopsied DCIS with calcifications. Normal left breast.  She is now post lumpectomy SLN with final pathology far right lateral mass  2.1 cm IDC, invasive ca within 0.1 cm anterior margin focally.0/6 SLN. Additional lumpectomy labeled right UOQ medial fibrocystic changes.  Plan Oncotypeifinvasive tumor if more than 1 cmand nodenegative, or mammaprint if note positive.  Current H cup. Has discussed breast reduction in remote past, states could no afford. Denies rashes, numbness hands, back pain. PMH significant for cervical spine fusion.  Wt up 40 lb over last year. Notes started with steroids for knees, then received Depo to stop periods at onset menopause as she was anemic.  Lives with adult son. Works at [employer], states spends day looking through [employer].  Review of Systems   12 point review negative Objective:   Physical Exam  Constitutional: She is oriented to person, place, and time.  Cardiovascular: Normal rate, regular rhythm and normal heart sounds.   Pulmonary/Chest: Effort normal and breath sounds normal.  Lymphadenopathy:    She has no axillary adenopathy.  Neurological: She is alert and oriented to person, place, and time.  Skin:  Fitzpatrick 6   No definite masses  Grade 3 ptosis  bilateral, L> R volume SN to nipple R 32 L 34 cm BW R 21 L 23 cm Nipple to IMF R 14 L 15 cm    Assessment:     Right breast ca UOQ ER+  Plan:     Plan oncoplastic reconstruction as staged procedure with reduction 7-10 d post lumpectomy to ensure pathologic clearance.Reviewed reduction with anchor type scars, drains, post operative visits and limitations, recovery. Diminished sensation nipple and breast skin, risk of nipple loss, wound healing problems, asymmetry. Discussedsmaller breast size may aid with adjuvant radiation. Discussed will have some contraction of breast volume and increased firmness with radiation, less ptosis with aging. Discussed changes with wt gain, loss, aging. If she pursues partial mastectomy, highly encourage her to pursue breast reduction prior to XRT as risk of complications post would be higher.   Irene Limbo, MD [facility] Plastic & Reconstructive Surgery [phone], pin [phone]

## 2017-05-26 NOTE — Anesthesia Procedure Notes (Signed)
Procedure Name: Intubation Date/Time: 05/26/2017 7:32 AM Performed by: Genelle Bal, CRNA Pre-anesthesia Checklist: Patient identified, Emergency Drugs available, Suction available and Patient being monitored Patient Re-evaluated:Patient Re-evaluated prior to induction Oxygen Delivery Method: Circle system utilized Preoxygenation: Pre-oxygenation with 100% oxygen Induction Type: IV induction Ventilation: Mask ventilation without difficulty Laryngoscope Size: Miller and 2 Grade View: Grade II Tube type: Oral Tube size: 7.0 mm Number of attempts: 1 Airway Equipment and Method: Stylet and Oral airway Placement Confirmation: ETT inserted through vocal cords under direct vision,  positive ETCO2 and breath sounds checked- equal and bilateral Secured at: 21 cm Tube secured with: Tape Dental Injury: Teeth and Oropharynx as per pre-operative assessment

## 2017-05-26 NOTE — H&P (Addendum)
HPI  Presents to ED following oncoplastic breast reduction this am as OP surgery as part treatment right breast cancer, post lumpectomy SLN. Called this pm with 100 ml drainage left breast. Otherwise pain expected post procedure and has no nausea, dizziness.   Presented following screening MMG with right breast mass with calcifications. MMG/US demonstrated1 cm mass with a span of 1.4 cm calcifications at 12-12:30.; additional 1-12:00 5 cm area of calcifications. The right axilla was negative for adenopathy. Biopsy of mass with IDC,ER/PR +, HER2 -. 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  MRI showed 2.8 x 2.0 x 0.5 cm biopsy-proven IDC/DCIS in the posterior aspect of the right UOQ. No mass or abnormal enhancement at the location of the biopsied DCISwith calcifications. Normal left breast.  She is now post lumpectomy SLN with final pathology far right lateral mass  2.1 cm IDC, invasive ca within 0.1 cm anterior margin focally.0/6 SLN. Additional lumpectomy labeled right UOQ medial fibrocystic changes. Oncotype low risk.   Objective:  Physical Exam HR 101 BO 125/90 Constitutional: She is oriented to person, place, and time. NAD Cardiovascular: Normal rate, regular rhythmand normal heart sounds.  Pulmonary/Chest: Effort normaland breath sounds normal.  Neurological: She is alertand oriented to person, place, and time.   Breasts: incisions intact, NAC viable, breasts left soft, right with unchanged edema post lumpectomy Right drain scant, left on admission emptied 60 ml sanguinous  Assessment:   Right breast ca UOQ ER+ S/p oncoplastic reconstruction  Plan:   Will obtain CBC- Hb 10.6 this am post lumpectomy. Of note had 650 ml old hematoma and seroma right lumpectomy cavity evacuated. On observation in ED, breast soft, no change in breast exam and drain output has slowed. Placed additional padding/pressure in compressive dressing. Plan  admit observation. NPO for now. Clinically stable.  Will order home HCTZ.  Irene Limbo, MD [facility] Plastic & Reconstructive Surgery [phone], pin [phone]

## 2017-05-26 NOTE — Anesthesia Postprocedure Evaluation (Signed)
Anesthesia Post Note  Patient: Felicia Bauer  Procedure(s) Performed: LEFT BREAST MAMMARY REDUCTION  (BREAST) AND RIGHT ONCOPLASTIC RECONSTRUCTION (Bilateral Breast)     Patient location during evaluation: PACU Anesthesia Type: General Level of consciousness: awake and alert Pain management: pain level controlled Vital Signs Assessment: post-procedure vital signs reviewed and stable Respiratory status: spontaneous breathing, nonlabored ventilation and respiratory function stable Cardiovascular status: blood pressure returned to baseline and stable Postop Assessment: no apparent nausea or vomiting Anesthetic complications: no    Last Vitals:  Vitals:   05/26/17 1130 05/26/17 1145  BP: 121/72   Pulse: (!) 110 (!) 108  Resp: 13 11  Temp:    SpO2: 97% 99%    Last Pain:  Vitals:   05/26/17 1145  TempSrc:   PainSc: 8                  Catalina Gravel

## 2017-05-26 NOTE — Progress Notes (Signed)
  Plastic Surgery  Patient reevaluated- remains normotensive. Home HCTZ dose administered  PE Left breast soft , drain with 10-15 ml since presenting to ED   A/P Maintain NPO, continue compression, observation. Hemodynamically stable with no evidence hematoma and drain output significantly slowed.  Irene Limbo, MD [facility] Plastic & Reconstructive Surgery [phone], pin [phone]

## 2017-05-26 NOTE — ED Triage Notes (Signed)
Breast reconstruction due to cancer and has drains from surgery and drains are feeling up at a good rate and was discharged today from out patient t.Has emptied the drain and was full and was shooting blood out at a good rate. Surgeon request she come to the ED. Pt has medication for pain from home and took at 4:00 pm to day.

## 2017-05-27 LAB — CBC
HCT: 28.9 % — ABNORMAL LOW (ref 36.0–46.0)
HEMOGLOBIN: 9.1 g/dL — AB (ref 12.0–15.0)
MCH: 27.4 pg (ref 26.0–34.0)
MCHC: 31.5 g/dL (ref 30.0–36.0)
MCV: 87 fL (ref 78.0–100.0)
PLATELETS: 254 10*3/uL (ref 150–400)
RBC: 3.32 MIL/uL — ABNORMAL LOW (ref 3.87–5.11)
RDW: 15.7 % — ABNORMAL HIGH (ref 11.5–15.5)
WBC: 11.5 10*3/uL — ABNORMAL HIGH (ref 4.0–10.5)

## 2017-05-27 NOTE — Discharge Summary (Signed)
Physician Discharge Summary  Patient ID: Felicia Bauer MRN: [id number] DOB/AGE: 02/11/1961 56 y.o.  Admit date: 05/26/2017 Discharge date: 05/27/2017  Admission Diagnoses: Right breast cancer  Discharge Diagnoses:  Active Problems:   Breast cancer, right [facility])   Discharged Condition: stable  Hospital Course: Patient admitted from ED for onset brisk drainage left JP drain bulb evening of OP surgery oncoplastic breast reduction/reconstruction. She was hemodynamically stable with soft breast. She was observed with slowing JP drainage. Patient instructed to continue compression, hold ASA, and avoid NSAIDs through follow up visit.  Discharge Exam: Blood pressure 108/64, pulse 85, temperature 98.2 F (36.8 C), temperature source Oral, resp. rate 20, height 5\' 1"  (1.549 m), weight 90.3 kg (199 lb), SpO2 98 %. Incision/Wound: scant drainage serous on dressings, left breast soft with serosanguinous JP drainage, right breast more induration consistent with prior lumpectomy surgery and hematoma/seroma evacuation present in lumpectomy cavity. Right axillary incision intact, soft  Disposition: 01-Home or Self Care  Discharge Instructions    Call MD for:  redness, tenderness, or signs of infection (pain, swelling, bleeding, redness, odor or green/yellow discharge around incision site)   Complete by:  As directed    Call MD for:  temperature >100.5   Complete by:  As directed    Discharge instructions   Complete by:  As directed    Ok to remove dressings and shower am 12.16.18. Soap and water ok, pat incisions dry. No creams or ointments over incisions. Do not let drains dangle in shower, attach to lanyard or similar.Strip and record drains twice daily and bring log to clinic visit.  Breast binder or soft compression bra all other times.  Ok to raise arms above shoulders for bathing and dressing.  No house yard work or exercise until cleared by MD. Hold ASA through follow up visit. No  ibuprofen, naproxen through follow up visit.   Driving Restrictions   Complete by:  As directed    No driving through follow up visit   Lifting restrictions   Complete by:  As directed    No lifting > 5 lbs through follow up visit   Resume previous diet   Complete by:  As directed      Allergies as of 05/27/2017   No Known Allergies     Medication List    STOP taking these medications   aspirin EC 81 MG tablet     TAKE these medications   celecoxib 200 MG capsule Commonly known as:  CELEBREX Take 200 mg by mouth daily.   clonazePAM 1 MG tablet Commonly known as:  KLONOPIN Take 0.5 mg by mouth as needed for anxiety.   diazepam 2 MG tablet Commonly known as:  VALIUM TAKE 2MG  BY MOUTH DAILY AS NEEDED   DULoxetine 30 MG capsule Commonly known as:  CYMBALTA TAKE ONE CAPSULE(30mg ) BY MOUTH EVERY DAY   fexofenadine-pseudoephedrine 180-240 MG 24 hr tablet Commonly known as:  ALLEGRA-D 24 Take 1 tablet by mouth daily as needed (for sinus).   gabapentin 600 MG tablet Commonly known as:  NEURONTIN Take 1 tablet (600 mg total) by mouth 3 (three) times daily.   hydrochlorothiazide 25 MG tablet Commonly known as:  HYDRODIURIL Take 25 mg by mouth daily.   HYDROcodone-acetaminophen 5-325 MG tablet Commonly known as:  NORCO Take 1-2 tablets by mouth every 4 (four) hours as needed for moderate pain.   pantoprazole 40 MG tablet Commonly known as:  PROTONIX Take 40 mg daily by mouth.  Follow-up Information    Irene Limbo, MD Follow up on 06/01/2017.   Specialty:  Plastic Surgery Why:  as scheduled Contact information: [place]  [id number] [phone]           Signed: Irene Limbo 05/27/2017, 8:46 AM

## 2017-05-27 NOTE — Progress Notes (Signed)
Bil breast incisions clean and dry, 2 JP drains intact, Extra breast binder given to pt. Pt and cousin understands drain care. Discharge instructions given to pt, verbalized understanding. Discharged to home accompanied by cousin.

## 2017-05-29 ENCOUNTER — Encounter (HOSPITAL_BASED_OUTPATIENT_CLINIC_OR_DEPARTMENT_OTHER): Payer: Self-pay | Admitting: Plastic Surgery

## 2017-06-05 ENCOUNTER — Ambulatory Visit: Payer: 59 | Attending: General Surgery | Admitting: Physical Therapy

## 2017-06-05 ENCOUNTER — Encounter: Payer: Self-pay | Admitting: Physical Therapy

## 2017-06-05 DIAGNOSIS — Z170 Estrogen receptor positive status [ER+]: Secondary | ICD-10-CM | POA: Insufficient documentation

## 2017-06-05 DIAGNOSIS — R293 Abnormal posture: Secondary | ICD-10-CM | POA: Diagnosis present

## 2017-06-05 DIAGNOSIS — Z483 Aftercare following surgery for neoplasm: Secondary | ICD-10-CM | POA: Insufficient documentation

## 2017-06-05 DIAGNOSIS — C50411 Malignant neoplasm of upper-outer quadrant of right female breast: Secondary | ICD-10-CM | POA: Diagnosis present

## 2017-06-05 NOTE — Therapy (Addendum)
[place], [place], [address] Phone: [phone]   Fax:  [phone]  Physical Therapy Treatment  Patient Details  Name: Felicia Bauer MRN: [id number] Date of Birth: 1960/10/08 Referring Provider: Dr. [name]   Encounter Date: 06/05/2017  PT End of Session - 06/05/17 0823    Visit Number  2    PT Start Time  0800    PT Stop Time  0838    PT Time Calculation (min)  38 min    Activity Tolerance  Patient tolerated treatment well    Behavior During Therapy  [facility] for tasks assessed/performed       Past Medical History:  Diagnosis Date  . Arthritis    knee   . Breast cancer, right ([place]) 04/2017  . Complication of anesthesia    hard to wake up post-op; itching after anesthesia  . GERD (gastroesophageal reflux disease)   . History of vertigo   . Hypertension    states under control with med., has been on med. > 10 yr.  . Mitral valve prolapse   . Panic attacks   . Wears partial dentures    upper    Past Surgical History:  Procedure Laterality Date  . ANTERIOR CERVICAL DECOMP/DISCECTOMY FUSION N/A 05/24/2013   Procedure: CERVICAL FOUR-FIVE, CERVICAL FIVE-SIX, CERVICAL SIX-SEVEN ANTERIOR CERVICAL DECOMPRESSION/DISCECTOMY FUSION 3 LEVELS;  Surgeon: Faythe Ghee, MD;  Location: [facility];  Service: Neurosurgery;  Laterality: N/A;  . ANTERIOR CERVICAL DECOMP/DISCECTOMY FUSION  10/27/2016   C3-4  . BREAST REDUCTION SURGERY Bilateral 05/26/2017   Procedure: LEFT BREAST MAMMARY REDUCTION  (BREAST) AND RIGHT ONCOPLASTIC RECONSTRUCTION;  Surgeon: Irene Limbo, MD;  Location: [facility];  Service: Plastics;  Laterality: Bilateral;  . CESAREAN SECTION     x 2  . DILATION AND CURETTAGE OF UTERUS  05/30/2008  . ENDOMETRIAL ABLATION  05/30/2008  . RADIOACTIVE SEED GUIDED PARTIAL MASTECTOMY WITH AXILLARY SENTINEL LYMPH NODE BIOPSY Right 05/11/2017   Procedure: RIGHT BRACKETED  RADIOACTIVE SEED GUIDED PARTIAL MASTECTOMY WITH  SENTINEL LYMPH NODE BIOPSY WITH ERAS PATHWAY;  Surgeon: [name], MD;  Location: [facility];  Service: General;  Laterality: Right;  PEC BLOCK  . TUBAL LIGATION      There were no vitals filed for this visit.  Subjective Assessment - 06/05/17 0804    Subjective  Patient underwent a right lumpectomy and sentinel node biopsy (6 negative nodes removed) on 05/11/17. She then underwent a bilateral breast reduction on 05/26/17. She then had to go to the ED due to excessive bleeding. She stayed 1 night in the hospital and was released. She reports she will undergo radiation beginning 06/16/17.    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 She had a 1 cm mass located in the upper inner quadrant and a 5 cm area of DCIS calcifications in the upper outer quadrant. It is ER/PR positive, HER2 negative with a Ki67 of 3%. Her cervical fusion was in 5/18 and she had a previous cervical fusion at a lower level in 2014. The patient is unclear of the exact levels that were fused. She had a right lumpectomy and sentinel node biopsy (6 nodes) on 05/11/17.    Patient Stated Goals  Make sure her arm is ok    Currently in Pain?  Yes    Pain Score  3     Pain Location  Breast  Pain Orientation  Right;Left    Pain Descriptors / Indicators  Sore    Pain Type  Surgical pain    Pain Onset  1 to 4 weeks ago    Pain Frequency  Intermittent    Aggravating Factors   Nothing    Pain Relieving Factors  Nothing         [facility] PT Assessment - 06/05/17 0001      Assessment   Medical Diagnosis  s/p right lumpectomy and SLNB with bil reduction    Referring Provider  Dr. [name]    Onset Date/Surgical Date  05/11/17    Hand Dominance  Right    Prior Therapy  baseline      Precautions   Precautions  Other (comment)    Precaution Comments  recent cancer; right arm lymphedema risk       Restrictions   Weight Bearing Restrictions  No      Balance Screen   Has the patient fallen in the past 6 months  No    Has the patient had a decrease in activity level because of a fear of falling?   No    Is the patient reluctant to leave their home because of a fear of falling?   No      Home Environment   Living Environment  Private residence    Living Arrangements  Children 17 y.o. 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    Leisure  She has not exercised since her cervical fusion in May 2018      Cognition   Overall Cognitive Status  Within Functional Limits for tasks assessed      Posture/Postural Control   Posture/Postural Control  Postural limitations    Postural Limitations  Rounded Shoulders;Forward head      ROM / Strength   AROM / PROM / Strength  AROM      AROM   AROM Assessment Site  Shoulder    Right/Left Shoulder  Right    Right Shoulder Extension  53 Degrees    Right Shoulder Flexion  148 Degrees    Right Shoulder ABduction  150 Degrees    Right Shoulder Internal Rotation  59 Degrees    Right Shoulder External Rotation  90 Degrees    Cervical Flexion  WNL    Cervical Extension  25% limited    Cervical - Right Side Bend  WNL    Cervical - Left Side Bend  WNL    Cervical - Right Rotation  25% limited    Cervical - Left Rotation  25% limited      Palpation   Palpation comment  Palpable fullness and edema present in right lateral breast; possible seroma? with palpable tissue tightness and scar tissue present around axillary incision        LYMPHEDEMA/ONCOLOGY QUESTIONNAIRE - 06/05/17 0812      Type   Cancer Type  s/p right lumpectomy and sentinel node biopsy      Surgeries   Lumpectomy Date  05/11/17    Sentinel Lymph Node Biopsy Date  05/11/17    Number Lymph Nodes  Removed  6      Treatment   Active Chemotherapy Treatment  No    Past Chemotherapy Treatment  No    Active Radiation Treatment  No    Past Radiation Treatment  No    Current Hormone Treatment  No    Past Hormone Therapy  No      What other symptoms do you have   Are you Having Heaviness or Tightness  Yes    Are you having Pain  Yes    Are you having pitting edema  No    Is it Hard or Difficult finding clothes that fit  No    Do you have infections  No    Is there Decreased scar mobility  Yes    Stemmer Sign  No      Lymphedema Assessments   Lymphedema Assessments  Upper extremities      Right Upper Extremity Lymphedema   10 cm Proximal to Olecranon Process  33.2 cm    Olecranon Process  26.8 cm    10 cm Proximal to Ulnar Styloid Process  26.3 cm    Just Proximal to Ulnar Styloid Process  18.5 cm    Across Hand at [employer]  19.8 cm    At [place] of 2nd Digit  6.9 cm      Left Upper Extremity Lymphedema   10 cm Proximal to Olecranon Process  32.5 cm    Olecranon Process  26.2 cm    10 cm Proximal to Ulnar Styloid Process  24.5 cm    Just Proximal to Ulnar Styloid Process  18.2 cm    Across Hand at [employer]  19.4 cm    At [place] of 2nd Digit  6.4 cm        Quick Dash - 06/05/17 0001    Open a tight or new jar  Mild difficulty    Do heavy household chores (wash walls, wash floors)  Unable    Carry a shopping bag or briefcase  Unable    Wash your back  Severe difficulty    Use a knife to cut food  No difficulty    Recreational activities in which you take some force or impact through your arm, shoulder, or hand (golf, hammering, tennis)  Unable    During the past week, to what extent has your arm, shoulder or hand problem interfered with your normal social activities with family, friends, neighbors, or groups?  Slightly    During the past week, to what extent has your arm, shoulder or hand problem limited your work or other regular daily activities  Extremely     Arm, shoulder, or hand pain.  Mild    Tingling (pins and needles) in your arm, shoulder, or hand  Mild    Difficulty Sleeping  Moderate difficulty    DASH Score  56.82 %                    PT Education - 06/05/17 0838    Education provided  Yes    Education Details  Reviewed HEP and importance of doing those during radiation; tlaked with pt about calling MD to be assessed for seroma    Person(s) Educated  Patient    Methods  Explanation;Demonstration    Comprehension  Verbalized understanding        Short Term Clinic Goals - 06/05/17 0846      CC Short Term Goal  #1   Title  Patient will report >/= 25% less pain at the end of her day to tolerate work with less difficulty.  Time  4    Period  Weeks    Status  Not Met      CC Short Term Goal  #2   Title  Increase cervical rotation to be limited </= 10% to turn head while driving with less difficulty.    Time  4    Period  Weeks    Status  Not Met      CC Short Term Goal  #3   Title  Report she has regained endurance and strength and is able to walk >/= 30 minutes without increased neck pain to improve cancer related outcomes and reduce recurrence risk.    Time  4    Period  Weeks    Status  Not Met      CC Short Term Goal  #4   Title  Demonstrate proper technique with home exercise program related to cervical range of motion and posture.    Time  4    Period  Weeks    Status  Not Met        Breast Clinic Goals - 04/19/17 1929      Patient will be able to verbalize understanding of pertinent lymphedema risk reduction practices relevant to her diagnosis specifically related to skin care.   Time  1    Period  Days    Status  Achieved      Patient will be able to return demonstrate and/or verbalize understanding of the post-op home exercise program related to regaining shoulder range of motion.   Time  1    Period  Days    Status  Achieved      Patient will be able to verbalize understanding of the  importance of attending the postoperative After Breast Cancer Class for further lymphedema risk reduction education and therapeutic exercise.   Time  1    Period  Days    Status  Achieved       Long Term Clinic Goals - 06/05/17 0847      CC Long Term Goal  #1   Title  Patient will demonstrate she has regained shoulder function and ROM has returned to baseline following breast surgery.    Time  8    Period  Weeks    Status  Achieved         Plan - 06/05/17 2993    Clinical Impression Statement  Patient underwent a right lumpectomy and sentinel node biopsy on 05/11/17 followed by a bilateral reduction on 05/26/17. She had 6 axillary nodes removed so is having some discomfort from that. She also has tightness and soreness from oncoplasty 10 days ago. Her swelling in her right lateral breast may be a seroma and was encouraged to call her surgeon about that. Her right arm measured ~ 1 cm larger than at baseline but could be post surgical swelling since oncoplasty was 10 days ago. She was encouraged to monitor her arm and contact us if it worsened. She was also encouraged to contact us if her swelling in her breast persisted. She never pursued therapy for her neck and reports she would like to wait to begin that.    PT Treatment/Interventions  ADLs/Self Care Home Management;Cryotherapy;Moist Heat;Iontophoresis '4mg'$ /ml Dexamethasone;Therapeutic activities;Therapeutic exercise;Patient/family education;Manual techniques;Scar mobilization;Passive range of motion;Dry needling    PT Next Visit Plan  Will D/C; pt declined PT for her neck presently. She may require PT for swelling but will f/u with MD and determine needs.    Consulted and Agree with  Plan of Care  Patient       Patient will benefit from skilled therapeutic intervention in order to improve the following deficits and impairments:  Postural dysfunction, Pain, Increased fascial restricitons, Decreased scar mobility, Impaired UE functional  use, Decreased range of motion, Decreased activity tolerance, Decreased endurance, Decreased knowledge of precautions  Visit Diagnosis: Abnormal posture  Aftercare following surgery for neoplasm  Malignant neoplasm of upper-outer quadrant of right breast in female, estrogen receptor positive [facility])     Problem List Patient Active Problem List   Diagnosis Date Noted  . Breast cancer, right ([place]) 05/26/2017  . Malignant neoplasm of upper-outer quadrant of right breast, estrogen receptor positive ([place]) 04/16/2017  . Cervical myelopathy ([place]) 05/24/2013  . Paresthesias 03/22/2013   Annia Friendly, PT 06/05/17 8:48 AM  [place], [place], [address] Phone: [phone]   Fax:  [phone]  Name: Felicia Bauer MRN: [id number] Date of Birth: 09-12-1960  PHYSICAL THERAPY DISCHARGE SUMMARY  Visits from Start of Care: 2  Current functional level related to goals / functional outcomes: See above; goals met   Remaining deficits: None related to breast cancer   Education / Equipment: Shoulder ROM HEP Plan: Patient agrees to discharge.  Patient goals were met. Patient is being discharged due to meeting the stated rehab goals.  ?????         Annia Friendly, [place] 08/14/17 3:50 PM

## 2017-06-07 NOTE — Progress Notes (Deleted)
Location of Breast Cancer:Upper-outer quadrant of right breast  Histology per Pathology Report:   Diagnosis 04-12-17  1. Breast, right, needle core biopsy, lateral aspect of upper outer quadrant, mass with calcs - INVASIVE DUCTAL CARCINOMA. - DUCTAL CARCINOMA IN SITU WITH CALCIFICATIONS. - SEE COMMENT. 2. Breast, right, needle core biopsy, loosely grouped calcs in more central, upper outer quadrant - DUCTAL CARCINOMA IN SITU WITH CALCIFICATIONS. - LOBULAR NEOPLASIA (ATYPICAL LOBULAR HYPERPLASIA). - FIBROCYSTIC CHANGES WITH ADENOSIS AND CALCIFICATIONS. Diagnosis  05-26-17  Dr. Arnoldo Hooker [place] 1. Breast, Mammoplasty, left - FIBROCYSTIC CHANGES WITH ADENOSIS AND CALCIFICATIONS. - THERE IS NO EVIDENCE OF MALIGNANCY. 2. Breast, Mammoplasty, right - FIBROCYSTIC CHANGES WITH ADENOSIS AND CALCIFICATIONS. - FAT NECROSIS. - THERE IS NO EVIDENCE OF MALIGNANCY. 3. Lymph node, sentinel, biopsy, Right Axillary #1 - ONE OF ONE LYMPH NODES NEGATIVE FOR CARCINOMA (0/1). 4. Lymph node, sentinel, biopsy, Right Axillary #2 - ONE OF ONE LYMPH NODES NEGATIVE FOR CARCINOMA (0/1). 5. Lymph node, sentinel, biopsy, Right Axillary #3 - ONE OF ONE LYMPH NODES NEGATIVE FOR CARCINOMA (0/1). 6. Lymph node, sentinel, biopsy, Right Axillary #4 - ONE OF ONE LYMPH NODES NEGATIVE FOR CARCINOMA (0/1). 7. Lymph node, sentinel, biopsy, Right Axillary #5 - ONE OF ONE LYMPH NODES NEGATIVE FOR CARCINOMA (0/1). 8. Lymph node, sentinel, biopsy, Right Axillary #6 - ONE OF ONE LYMPH NODES NEGATIVE FOR CARCINOMA (0/1).  1. PROGNOSTIC INDICATORS Receptor Status: ER(95 % + ), PR ( 95 % +) ,Her2: Negative.Ki-( 3%)  2. PROGNOSTIC INDICATORS Receptor Status: ER(100 % + ), PR ( 90 % +) ,Her2: Negative.Ki-( 3%)  Did patient present with symptoms (if so, please note symptoms) or was this found on screening mammography?: Screening mammogram    Past/Anticipated interventions by surgeon, if any:   Diagnosis  05-26-17  Dr.  Irene Limbo 1. Breast, Mammoplasty, left - FIBROCYSTIC CHANGES WITH ADENOSIS AND CALCIFICATIONS. - THERE IS NO EVIDENCE OF MALIGNANCY. 2. Breast, Mammoplasty, right - FIBROCYSTIC CHANGES WITH ADENOSIS AND CALCIFICATIONS. - FAT NECROSIS. - THERE IS NO EVIDENCE OF MALIGNANCY.   Diagnosis 05-11-17 1. Breast, lumpectomy, Far Right Lateral Mass - INVASIVE DUCTAL CARCINOMA, GRADE 1, SPANNING 2.1 CM. - ATYPICAL DUCTAL HYPERPLASIA. - INVASIVE CARCINOMA COMES TO WITHIN 0.1 CM OF THE ANTERIOR MARGIN FOCALLY. - BIOPSY SITE. - SEE ONCOLOGY TABLE. 2. Breast, lumpectomy, Right Upper Outer Quadrant Medial - FIBROCYSTIC CHANGE WITH CALCIFICATIONS. - BIOPSY SITE. - NO RESIDUAL CARCINOMA IDENTIFIED. 3. Lymph node, sentinel, biopsy, Right Axillary #1 - ONE OF ONE LYMPH NODES NEGATIVE FOR CARCINOMA (0/1). 4. Lymph node, sentinel, biopsy, Right Axillary #2 - ONE OF ONE LYMPH NODES NEGATIVE FOR CARCINOMA (0/1). 5. Lymph node, sentinel, biopsy, Right Axillary #3 - ONE OF ONE LYMPH NODES NEGATIVE FOR CARCINOMA (0/1). 6. Lymph node, sentinel, biopsy, Right Axillary #4 - ONE OF ONE LYMPH NODES NEGATIVE FOR CARCINOMA (0/1). 7. Lymph node, sentinel, biopsy, Right Axillary #5 - ONE OF ONE LYMPH NODES NEGATIVE FOR CARCINOMA (0/1). 8. Lymph node, sentinel, biopsy, Right Axillary #6 - ONE OF ONE LYMPH NODES NEGATIVE FOR CARCINOMA (0/1).   Receptor Status: ER(95 % + ), PR ( 95 % +) ,Her2: Negative (ratio 1.28).Ki-( 3%)  Past/Anticipated interventions by medical oncology, if any: Chemotherapy   05-11-17 Oncotype Dx test on the surgical invasive tumor sample result 16,adjuvant endocrine therapy with Tamoxifen,   Adjuvant breast radiation after lumpectomy was discussed and offered to her.  Lymphedema issues, if any:     Pain issues, if any:    SAFETY ISSUES:  Prior radiation? : No  Pacemaker/ICD? : No  Possible current  pregnancy? :  Is the patient on methotrexate? :No  Menarche    G P    BC   Menopause    HRT Current Complaints / other details: HTN an d mitral valve disorder, mother had colon cancer, diagnosed at 40 yo.                                                         [name], RN 06/07/2017,9:59 AM

## 2017-06-07 NOTE — Progress Notes (Signed)
Radiation Oncology         [phone] ________________________________  Name: Felicia Bauer        MRN: [id number]  Date of Service: 06/08/2017 DOB: 1960-08-12  AC:ZYSAYT, Felicia Main, MD  Felicia Rudd, MD     REFERRING PHYSICIAN: Kyung Rudd, MD   DIAGNOSIS: The encounter diagnosis was Malignant neoplasm of upper-outer quadrant of right breast in female, estrogen receptor positive ([place]).   HISTORY OF PRESENT ILLNESS: Felicia Bauer is a 56 y.o. female originally seen in the multidisciplinary breast clinic for a new diagnosis of right  breast cancer. The patient was noted to have a screening detected mass with calcifications. She underwent diagnostic imaging on 04/07/17 which revealed a 1 cm mass with a span of 1.4 cm calcifications at 12-12:30. And from 11-12:00 there is a 5 cm area of calcifications. The right axilla was negative for adenopathy. A stereotactic biopsy of both revealed a grade 1, invasive ductal carcinoma, ER/PR positive, HER2 negative with a Ki 67 of 3%. 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   On 05/11/17 she was taken to the operating room where she underwent right lumpectomy x2 with sentinel lymph node biopsy.  Final pathology revealed a 2.1 cm grade 1 invasive ductal carcinoma with atypical ductal hyperplasia, along the anterior margin there was focal involvement at 1 mm from the edge of the tumor.  A second specimen from the lumpectomy was negative for residual disease, and her 6 lymph nodes sampled, none contained any evidence of cancer.  She had an Oncotype test that was ordered and returned a result of 12, and Dr. Burr Medico has recommended against chemotherapy.  She did undergo bilateral reduction on 05/26/2017 with Dr. Iran Planas, and her tissue bilaterally was negative for disease.  She comes today for further recommendations of adjuvant radiotherapy, and is also scheduled for a mammogram to follow up and ensure she doesn't have remaining  calcificiations given that her second lesion on imaging was biopsy proven to be DCIS, though her final pathology was negative for disease.   PREVIOUS RADIATION THERAPY: No   PAST MEDICAL HISTORY:  Past Medical History:  Diagnosis Date  . Arthritis    knee   . Breast cancer, right ([place]) 04/2017  . Complication of anesthesia    hard to wake up post-op; itching after anesthesia  . GERD (gastroesophageal reflux disease)   . History of vertigo   . Hypertension    states under control with med., has been on med. > 10 yr.  . Mitral valve prolapse   . Panic attacks   . Wears partial dentures    upper       PAST SURGICAL HISTORY: Past Surgical History:  Procedure Laterality Date  . ANTERIOR CERVICAL DECOMP/DISCECTOMY FUSION N/A 05/24/2013   Procedure: CERVICAL FOUR-FIVE, CERVICAL FIVE-SIX, CERVICAL SIX-SEVEN ANTERIOR CERVICAL DECOMPRESSION/DISCECTOMY FUSION 3 LEVELS;  Surgeon: Faythe Ghee, MD;  Location: [facility];  Service: Neurosurgery;  Laterality: N/A;  . ANTERIOR CERVICAL DECOMP/DISCECTOMY FUSION  10/27/2016   C3-4  . BREAST REDUCTION SURGERY Bilateral 05/26/2017   Procedure: LEFT BREAST MAMMARY REDUCTION  (BREAST) AND RIGHT ONCOPLASTIC RECONSTRUCTION;  Surgeon: Irene Limbo, MD;  Location: [facility];  Service: Plastics;  Laterality: Bilateral;  . CESAREAN SECTION     x 2  . DILATION AND CURETTAGE OF UTERUS  05/30/2008  . ENDOMETRIAL ABLATION  05/30/2008  . RADIOACTIVE SEED GUIDED PARTIAL MASTECTOMY WITH AXILLARY SENTINEL LYMPH NODE BIOPSY Right 05/11/2017  Procedure: RIGHT BRACKETED RADIOACTIVE SEED GUIDED PARTIAL MASTECTOMY WITH  SENTINEL LYMPH NODE BIOPSY WITH ERAS PATHWAY;  Surgeon: [name], MD;  Location: [facility];  Service: General;  Laterality: Right;  PEC BLOCK  . TUBAL LIGATION       FAMILY HISTORY:  Family History  Problem Relation Age of Onset  . Cancer Mother 66       colon cancer      SOCIAL HISTORY:   reports that  has never smoked. she has never used smokeless tobacco. She reports that she does not drink alcohol or use drugs. The patient is married but separated and lives in [place]. She works for [employer].    ALLERGIES: Patient has no known allergies.   MEDICATIONS:  Current Outpatient Medications  Medication Sig Dispense Refill  . celecoxib (CELEBREX) 200 MG capsule Take 200 mg by mouth daily.     . clonazePAM (KLONOPIN) 1 MG tablet Take 0.5 mg by mouth as needed for anxiety.    . diazepam (VALIUM) 2 MG tablet TAKE '2MG'$  BY MOUTH DAILY AS NEEDED    . DULoxetine (CYMBALTA) 30 MG capsule TAKE ONE CAPSULE('30mg'$ ) BY MOUTH EVERY DAY    . fexofenadine-pseudoephedrine (ALLEGRA-D 24) 180-240 MG per 24 hr tablet Take 1 tablet by mouth daily as needed (for sinus).    . gabapentin (NEURONTIN) 600 MG tablet Take 1 tablet (600 mg total) by mouth 3 (three) times daily. 100 tablet 2  . hydrochlorothiazide (HYDRODIURIL) 25 MG tablet Take 25 mg by mouth daily.    Marland Kitchen HYDROcodone-acetaminophen (NORCO) 5-325 MG tablet Take 1-2 tablets by mouth every 4 (four) hours as needed for moderate pain. 30 tablet 0  . pantoprazole (PROTONIX) 40 MG tablet Take 40 mg daily by mouth.  2   No current facility-administered medications for this encounter.      REVIEW OF SYSTEMS: On review of systems, the patient reports that she is doing well overall. She feels as though her right axilla is enlarging again and is hoping she doesn't need to have fluid drawn off again. She will call Dr. Barry Dienes to have this evaluated.  She denies any chest pain, shortness of breath, cough, fevers, chills, night sweats, unintended weight changes. She denies any bowel or bladder disturbances, and denies abdominal pain, nausea or vomiting. She denies any new musculoskeletal or joint aches or pains. A complete review of systems is obtained and is otherwise negative.     PHYSICAL EXAM:  Wt Readings from Last 3 Encounters:  06/08/17 193  lb (87.5 kg)  05/26/17 199 lb (90.3 kg)  05/26/17 199 lb (90.3 kg)   Temp Readings from Last 3 Encounters:  06/08/17 98.2 F (36.8 C) (Oral)  05/27/17 98.5 F (36.9 C) (Oral)  05/26/17 98.8 F (37.1 C)   BP Readings from Last 3 Encounters:  06/08/17 132/68  05/27/17 (!) 111/52  05/26/17 132/78   Pulse Readings from Last 3 Encounters:  06/08/17 91  05/27/17 77  05/26/17 (!) 111     In general this is a well appearing African American female in no acute distress. She is alert and oriented x4 and appropriate throughout the examination. HEENT reveals that the patient is normocephalic, atraumatic. EOMs are intact. PERRLA. Cardiopulmonary assessment is negative for acute distress and she exhibits normal effort. Bilateral breast exam is performed and reveals healing keyhole incision on the left breast with mild serosanguinous drainage inferiorly along the left incision site. No erythema is noted of the breast. The right  breast has a well healing axillary incision that is in the lower aspect almost within the upper outer quadrant, there is some fullness concerning for seroma. No erythema is noted, and the keyhole incision is well healed of the remainder of the breast.     ECOG = 0  0 - Asymptomatic (Fully active, able to carry on all predisease activities without restriction)  1 - Symptomatic but completely ambulatory (Restricted in physically strenuous activity but ambulatory and able to carry out work of a light or sedentary nature. For example, light housework, office work)  2 - Symptomatic, <50% in bed during the day (Ambulatory and capable of all self care but unable to carry out any work activities. Up and about more than 50% of waking hours)  3 - Symptomatic, >50% in bed, but not bedbound (Capable of only limited self-care, confined to bed or chair 50% or more of waking hours)  4 - Bedbound (Completely disabled. Cannot carry on any self-care. Totally confined to bed or chair)  5  - Death   Eustace Pen MM, Creech RH, Tormey DC, et al. [phone]). "Toxicity and response criteria of the [facility]". [place] Oncol. 5 (6): 649-55    LABORATORY DATA:  Lab Results  Component Value Date   WBC 11.5 (H) 05/27/2017   HGB 9.1 (L) 05/27/2017   HCT 28.9 (L) 05/27/2017   MCV 87.0 05/27/2017   PLT 254 05/27/2017   Lab Results  Component Value Date   NA 137 05/19/2017   K 3.7 05/19/2017   CL 102 05/19/2017   CO2 27 05/19/2017   Lab Results  Component Value Date   ALT 12 04/19/2017   AST 17 04/19/2017   ALKPHOS 58 04/19/2017   BILITOT 0.47 04/19/2017      RADIOGRAPHY: Nm Sentinel Node Inj-no Rpt (breast)  Result Date: 05/11/2017 Sulfur colloid was injected by the nuclear medicine technologist for melanoma sentinel node.   Mm Breast Surgical Specimen  Result Date: 05/11/2017 CLINICAL DATA:  Evaluate surgical specimen following right lumpectomy for upper right breast DCIS and ALH. EXAM: SPECIMEN RADIOGRAPH OF THE RIGHT BREAST COMPARISON:  Previous exam(s). FINDINGS: Status post excision of the right breast. The radioactive seed and coil shaped biopsy marker clip are present, completely intact, and were marked for pathology. IMPRESSION: Specimen radiograph of the right breast. Electronically Signed   By: Margarette Canada M.D.   On: 05/11/2017 14:36   Mm Breast Surgical Specimen  Result Date: 05/11/2017 CLINICAL DATA:  Evaluate surgical specimen following excision of outer right breast invasive ductal carcinoma/DCIS. EXAM: SPECIMEN RADIOGRAPH OF THE RIGHT BREAST COMPARISON:  Previous exam(s). FINDINGS: Status post excision of the right breast. The radioactive seed and X shaped biopsy marker clip are present, completely intact, and were marked for pathology. IMPRESSION: Specimen radiograph of the right breast. Electronically Signed   By: Margarette Canada M.D.   On: 05/11/2017 14:16   Mm Rt Radioactive Seed Loc Mammo Guide  Result Date: 05/10/2017 CLINICAL  DATA:  56 year old female with recently diagnosed invasive ductal carcinoma and DCIS of the upper-outer right breast at site of X shaped biopsy marking clip and DCIS and ALH at site of coil shaped biopsy marking clip in the upper central right breast. EXAM: MAMMOGRAPHIC GUIDED RADIOACTIVE SEED LOCALIZATION OF THE RIGHT BREAST COMPARISON:  Previous exam(s). FINDINGS: Patient presents for radioactive seed localization prior to right breast lumpectomies. I met with the patient and we discussed the procedure of seed localization including benefits and alternatives.  We discussed the high likelihood of a successful procedure. We discussed the risks of the procedure including infection, bleeding, tissue injury and further surgery. We discussed the low dose of radioactivity involved in the procedure. Informed, written consent was given. The usual time-out protocol was performed immediately prior to the procedure. SITE 1: RIGHT BREAST UPPER OUTER, X SHAPED BIOPSY MARKING CLIP: Using mammographic guidance, sterile technique, 1% lidocaine and an I-125 radioactive seed, the X shaped biopsy marking clip with associated mass and calcifications was localized using a superior to inferior approach. The follow-up mammogram images confirm the seed in the expected location and were marked for Dr. Barry Dienes. Follow-up survey of the patient confirms presence of the radioactive seed. Order number of I-125 seed:  [id number]. Total activity:  4.481 millicuries  Reference Date: 04/28/2017 SITE 2: RIGHT BREAST UPPER CENTRAL, COIL SHAPED BIOPSY MARKING CLIP: Using mammographic guidance, sterile technique, 1% lidocaine and an I-125 radioactive seed, the coil shaped biopsy marking clip with associated calcifications was localized using a superior to inferior approach. The follow-up mammogram images confirm the seed in the expected location and were marked for Dr. Barry Dienes. Follow-up survey of the patient confirms presence of the radioactive seed.  Order number of I-125 seed:  [id number]. Total activity:  2.637 millicuries  Reference Date: 04/27/2017 The patient tolerated the procedure well and was released from the [facility]. She was given instructions regarding seed removal. IMPRESSION: Radioactive seed localization right breast. No apparent complications. Electronically Signed   By: Everlean Alstrom M.D.   On: 05/10/2017 15:11   Mm Rt Radio Seed Ea Add Lesion Loc Mammo  Result Date: 05/10/2017 CLINICAL DATA:  56 year old female with recently diagnosed invasive ductal carcinoma and DCIS of the upper-outer right breast at site of X shaped biopsy marking clip and DCIS and ALH at site of coil shaped biopsy marking clip in the upper central right breast. EXAM: MAMMOGRAPHIC GUIDED RADIOACTIVE SEED LOCALIZATION OF THE RIGHT BREAST COMPARISON:  Previous exam(s). FINDINGS: Patient presents for radioactive seed localization prior to right breast lumpectomies. I met with the patient and we discussed the procedure of seed localization including benefits and alternatives. We discussed the high likelihood of a successful procedure. We discussed the risks of the procedure including infection, bleeding, tissue injury and further surgery. We discussed the low dose of radioactivity involved in the procedure. Informed, written consent was given. The usual time-out protocol was performed immediately prior to the procedure. SITE 1: RIGHT BREAST UPPER OUTER, X SHAPED BIOPSY MARKING CLIP: Using mammographic guidance, sterile technique, 1% lidocaine and an I-125 radioactive seed, the X shaped biopsy marking clip with associated mass and calcifications was localized using a superior to inferior approach. The follow-up mammogram images confirm the seed in the expected location and were marked for Dr. Barry Dienes. Follow-up survey of the patient confirms presence of the radioactive seed. Order number of I-125 seed:  [id number]. Total activity:  4.128 millicuries  Reference Date:  04/28/2017 SITE 2: RIGHT BREAST UPPER CENTRAL, COIL SHAPED BIOPSY MARKING CLIP: Using mammographic guidance, sterile technique, 1% lidocaine and an I-125 radioactive seed, the coil shaped biopsy marking clip with associated calcifications was localized using a superior to inferior approach. The follow-up mammogram images confirm the seed in the expected location and were marked for Dr. Barry Dienes. Follow-up survey of the patient confirms presence of the radioactive seed. Order number of I-125 seed:  [id number]. Total activity:  4.709 millicuries  Reference Date: 04/27/2017 The patient tolerated the procedure well and [facility] She was given instructions regarding seed removal. IMPRESSION: Radioactive seed localization right breast. No apparent complications. Electronically Signed   By: Everlean Alstrom M.D.   On: 05/10/2017 15:11       IMPRESSION/PLAN: 1. Stage IA, pT2N0M0, grade 1, ER/PR positive invasive ductal carcinoma of the right breast. Dr. Lisbeth Renshaw discusses the pathology findings and her postop course. She is still healing, and Dr. Lisbeth Renshaw again recommends adjuvant radiotherapy followed by adjuvant endocrine therapy.  We discussed the risks, benefits, short, and long term effects of radiotherapy, and the patient is interested in proceeding. Dr. Lisbeth Renshaw discusses the delivery and logistics of radiotherapy and anticipates a course of 6 1/2 weeks. Written consent is obtained and placed in the chart, a copy was provided to the patient. She will simulate on 06/27/16 to ensure she has more time to heal, and have her axilla re-evaluated by Dr. Barry Dienes. 2. Prior biopsy consistent with low grade DCIS. As above this was not seen in the second specimen from her lumpectomy, nor of her pathology from her mammoplasty specimen which was 17 x 5 cm. She will proceed with a mammogram prior to starting radiation to ensure that she's not having remaining calcifications. She is in agreement with this  process.  In a visit lasting 25 minutes, greater than 50% of the time was spent face to face discussing her course, and coordinating the patient's care.  The above documentation reflects my direct findings during this shared patient visit. Please see the separate note by Dr. Lisbeth Renshaw on this date for the remainder of the patient's plan of care.    Carola Rhine, PAC

## 2017-06-08 ENCOUNTER — Ambulatory Visit: Payer: 59 | Admitting: Radiation Oncology

## 2017-06-08 ENCOUNTER — Other Ambulatory Visit: Payer: Self-pay

## 2017-06-08 ENCOUNTER — Ambulatory Visit
Admission: RE | Admit: 2017-06-08 | Discharge: 2017-06-08 | Disposition: A | Discharge status: Home / Self Care | Payer: 59 | Source: Ambulatory Visit | Attending: Radiation Oncology | Admitting: Radiation Oncology

## 2017-06-08 ENCOUNTER — Encounter: Payer: Self-pay | Admitting: Radiation Oncology

## 2017-06-08 VITALS — BP 132/68 | HR 91 | Temp 98.2°F | Resp 18 | Ht 61.0 in | Wt 193.0 lb

## 2017-06-08 DIAGNOSIS — C50411 Malignant neoplasm of upper-outer quadrant of right female breast: Secondary | ICD-10-CM

## 2017-06-08 DIAGNOSIS — K219 Gastro-esophageal reflux disease without esophagitis: Secondary | ICD-10-CM | POA: Diagnosis not present

## 2017-06-08 DIAGNOSIS — Z170 Estrogen receptor positive status [ER+]: Principal | ICD-10-CM

## 2017-06-08 DIAGNOSIS — Z510 Encounter for antineoplastic radiation therapy: Secondary | ICD-10-CM | POA: Diagnosis present

## 2017-06-08 DIAGNOSIS — I10 Essential (primary) hypertension: Secondary | ICD-10-CM | POA: Diagnosis not present

## 2017-06-08 DIAGNOSIS — D0511 Intraductal carcinoma in situ of right breast: Secondary | ICD-10-CM | POA: Diagnosis not present

## 2017-06-08 NOTE — Progress Notes (Addendum)
Location of Breast Cancer:Upper-outer quadrant of right breast  Histology per Pathology Report:   Diagnosis 04-12-17  1. Breast, right, needle core biopsy, lateral aspect of upper outer quadrant, mass with calcs - INVASIVE DUCTAL CARCINOMA. - DUCTAL CARCINOMA IN SITU WITH CALCIFICATIONS. - SEE COMMENT. 2. Breast, right, needle core biopsy, loosely grouped calcs in more central, upper outer quadrant - DUCTAL CARCINOMA IN SITU WITH CALCIFICATIONS. - LOBULAR NEOPLASIA (ATYPICAL LOBULAR HYPERPLASIA). - FIBROCYSTIC CHANGES WITH ADENOSIS AND CALCIFICATIONS. Diagnosis  05-26-17  Dr. Arnoldo Hooker [place] 1. Breast, Mammoplasty, left - FIBROCYSTIC CHANGES WITH ADENOSIS AND CALCIFICATIONS. - THERE IS NO EVIDENCE OF MALIGNANCY. 2. Breast, Mammoplasty, right - FIBROCYSTIC CHANGES WITH ADENOSIS AND CALCIFICATIONS. - FAT NECROSIS. - THERE IS NO EVIDENCE OF MALIGNANCY. 3. Lymph node, sentinel, biopsy, Right Axillary #1 - ONE OF ONE LYMPH NODES NEGATIVE FOR CARCINOMA (0/1). 4. Lymph node, sentinel, biopsy, Right Axillary #2 - ONE OF ONE LYMPH NODES NEGATIVE FOR CARCINOMA (0/1). 5. Lymph node, sentinel, biopsy, Right Axillary #3 - ONE OF ONE LYMPH NODES NEGATIVE FOR CARCINOMA (0/1). 6. Lymph node, sentinel, biopsy, Right Axillary #4 - ONE OF ONE LYMPH NODES NEGATIVE FOR CARCINOMA (0/1). 7. Lymph node, sentinel, biopsy, Right Axillary #5 - ONE OF ONE LYMPH NODES NEGATIVE FOR CARCINOMA (0/1). 8. Lymph node, sentinel, biopsy, Right Axillary #6 - ONE OF ONE LYMPH NODES NEGATIVE FOR CARCINOMA (0/1).  1. PROGNOSTIC INDICATORS Receptor Status: ER(95 % + ), PR ( 95 % +) ,Her2: Negative.Ki-( 3%)  2. PROGNOSTIC INDICATORS Receptor Status: ER(100 % + ), PR ( 90 % +) ,Her2: Negative.Ki-( 3%)  Did patient present with symptoms (if so, please note symptoms) or was this found on screening mammography?: Screening mammogram   Past/Anticipated interventions by surgeon, if any:   Diagnosis  05-26-17  Dr. Irene Limbo 1. Breast, Mammoplasty, left - FIBROCYSTIC CHANGES WITH ADENOSIS AND CALCIFICATIONS. - THERE IS NO EVIDENCE OF MALIGNANCY. 2. Breast, Mammoplasty, right - FIBROCYSTIC CHANGES WITH ADENOSIS AND CALCIFICATIONS. - FAT NECROSIS. - THERE IS NO EVIDENCE OF MALIGNANCY.   Diagnosis 05-11-17 1. Breast, lumpectomy, Far Right Lateral Mass - INVASIVE DUCTAL CARCINOMA, GRADE 1, SPANNING 2.1 CM. - ATYPICAL DUCTAL HYPERPLASIA. - INVASIVE CARCINOMA COMES TO WITHIN 0.1 CM OF THE ANTERIOR MARGIN FOCALLY. - BIOPSY SITE. - SEE ONCOLOGY TABLE. 2. Breast, lumpectomy, Right Upper Outer Quadrant Medial - FIBROCYSTIC CHANGE WITH CALCIFICATIONS. - BIOPSY SITE. - NO RESIDUAL CARCINOMA IDENTIFIED. 3. Lymph node, sentinel, biopsy, Right Axillary #1 - ONE OF ONE LYMPH NODES NEGATIVE FOR CARCINOMA (0/1). 4. Lymph node, sentinel, biopsy, Right Axillary #2 - ONE OF ONE LYMPH NODES NEGATIVE FOR CARCINOMA (0/1). 5. Lymph node, sentinel, biopsy, Right Axillary #3 - ONE OF ONE LYMPH NODES NEGATIVE FOR CARCINOMA (0/1). 6. Lymph node, sentinel, biopsy, Right Axillary #4 - ONE OF ONE LYMPH NODES NEGATIVE FOR CARCINOMA (0/1). 7. Lymph node, sentinel, biopsy, Right Axillary #5 - ONE OF ONE LYMPH NODES NEGATIVE FOR CARCINOMA (0/1). 8. Lymph node, sentinel, biopsy, Right Axillary #6 - ONE OF ONE LYMPH NODES NEGATIVE FOR CARCINOMA (0/1).   Receptor Status: ER(95 % + ), PR ( 95 % +) ,Her2: Negative (ratio 1.28).Ki-( 3%)  Past/Anticipated interventions by medical oncology, if any: Chemotherapy   05-11-17 Oncotype Dx test on the surgical invasive tumor sample result 16,adjuvant endocrine therapy with Tamoxifen,   Adjuvant breast radiation after lumpectomy was discussed and offered to her.  Lymphedema issues, if any: No Has good ROM to both arms Skin to right breast healing has one area that bleeds that started after her follow up  appointment  with  Dr. Alvina Filbert two weeks ago, she plan s to make her aware of the situation. Pain issues, if any:  2/10 left arm taking Hydrocodone  SAFETY ISSUES:  Prior radiation? : No  Pacemaker/ICD? : No  Possible current pregnancy? : No  Is the patient on methotrexate? :No  Menarche 12   G2 P2    BC 14   Menopause 52   HRT  51month Current Complaints / other details: HTN an d mitral valve disorder, mother had colon cancer, diagnosed at 789yo. Wt Readings from Last 3 Encounters:  06/08/17 193 lb (87.5 kg)  05/26/17 199 lb (90.3 kg)  05/26/17 199 lb (90.3 kg)  BP 132/68 (BP Location: Left Arm, Patient Position: Sitting, Cuff Size: Normal)   Pulse 91   Temp 98.2 F (36.8 C) (Oral)   Resp 18   Ht _0  (1.549 m)   Wt 193 lb (87.5 kg)   SpO2 99%   BMI 36.47 kg/m                                                         BGeorgena Spurling RN 06/08/2017,9:23 AM

## 2017-06-13 DIAGNOSIS — Z170 Estrogen receptor positive status [ER+]: Secondary | ICD-10-CM | POA: Diagnosis not present

## 2017-06-13 DIAGNOSIS — C50411 Malignant neoplasm of upper-outer quadrant of right female breast: Secondary | ICD-10-CM | POA: Diagnosis not present

## 2017-06-13 DIAGNOSIS — Z510 Encounter for antineoplastic radiation therapy: Secondary | ICD-10-CM | POA: Diagnosis not present

## 2017-06-15 ENCOUNTER — Ambulatory Visit
Admission: RE | Admit: 2017-06-15 | Discharge: 2017-06-15 | Disposition: A | Discharge status: Home / Self Care | Payer: 59 | Source: Ambulatory Visit | Attending: Radiation Oncology | Admitting: Radiation Oncology

## 2017-06-23 DIAGNOSIS — R202 Paresthesia of skin: Secondary | ICD-10-CM | POA: Diagnosis not present

## 2017-06-23 DIAGNOSIS — G8928 Other chronic postprocedural pain: Secondary | ICD-10-CM | POA: Diagnosis not present

## 2017-06-23 DIAGNOSIS — Z981 Arthrodesis status: Secondary | ICD-10-CM | POA: Diagnosis not present

## 2017-06-27 ENCOUNTER — Ambulatory Visit
Admission: RE | Admit: 2017-06-27 | Discharge: 2017-06-27 | Disposition: A | Discharge status: Home / Self Care | Payer: 59 | Source: Ambulatory Visit | Attending: Radiation Oncology | Admitting: Radiation Oncology

## 2017-06-27 DIAGNOSIS — Z510 Encounter for antineoplastic radiation therapy: Secondary | ICD-10-CM | POA: Diagnosis not present

## 2017-06-27 DIAGNOSIS — Z170 Estrogen receptor positive status [ER+]: Principal | ICD-10-CM

## 2017-06-27 DIAGNOSIS — C50411 Malignant neoplasm of upper-outer quadrant of right female breast: Secondary | ICD-10-CM

## 2017-06-28 ENCOUNTER — Telehealth: Payer: Self-pay | Admitting: Nurse Practitioner

## 2017-06-28 ENCOUNTER — Other Ambulatory Visit: Payer: Self-pay | Admitting: Nurse Practitioner

## 2017-06-28 DIAGNOSIS — R232 Flushing: Secondary | ICD-10-CM

## 2017-06-28 NOTE — Telephone Encounter (Signed)
Patient reports frequent hot flashes with drenching sweats. She is already on Gabapentin and SSRI which are often prescribed first for hot flashes. She is agreeable to try acupuncture. Will refer to PT.

## 2017-06-28 NOTE — Progress Notes (Signed)
  Radiation Oncology         [phone] ________________________________  Name: AIME CARRERAS MRN: [id number]  Date: 06/27/2017  DOB: February 19, 1961  DIAGNOSIS:     ICD-10-CM   1. Malignant neoplasm of upper-outer quadrant of right breast in female, estrogen receptor positive ([place]) C50.411    Z17.0      SIMULATION AND TREATMENT PLANNING NOTE  The patient presented for simulation prior to beginning her course of radiation treatment for her diagnosis of right-sided breast cancer. The patient was placed in a supine position on a breast board. A customized vac-lock bag was constructed and this complex treatment device will be used on a daily basis during her treatment. In this fashion, a CT scan was obtained through the chest area and an isocenter was placed near the chest wall within the breast.  The patient will be planned to receive a course of radiation initially to a dose of 50.4 Gy. This will consist of a whole breast radiotherapy technique. To accomplish this, 2 customized blocks have been designed which will correspond to medial and lateral whole breast tangent fields. This treatment will be accomplished at 1.8 Gy per fraction. A forward planning technique will also be evaluated to determine if this approach improves the plan. It is anticipated that the patient will then receive a 10 Gy boost to the seroma cavity which has been contoured. This will be accomplished at 2 Gy per fraction.   This initial treatment will consist of a 3-D conformal technique. The seroma has been contoured as the primary target structure. Additionally, dose volume histograms of both this target as well as the lungs and heart will also be evaluated. Such an approach is necessary to ensure that the target area is adequately covered while the nearby critical  normal structures are adequately spared.  Plan:  The final anticipated total dose therefore will correspond to 60.4  Gy.    _______________________________   Jodelle Gross, MD, PhD

## 2017-06-28 NOTE — Progress Notes (Signed)
  Radiation Oncology         [phone] ________________________________  Name: Felicia Bauer MRN: [id number]  Date: 06/27/2017  DOB: Apr 04, 1961  Optical Surface Tracking Plan:  Since intensity modulated radiotherapy (IMRT) and 3D conformal radiation treatment methods are predicated on accurate and precise positioning for treatment, intrafraction motion monitoring is medically necessary to ensure accurate and safe treatment delivery.  The ability to quantify intrafraction motion without excessive ionizing radiation dose can only be performed with optical surface tracking. Accordingly, surface imaging offers the opportunity to obtain 3D measurements of patient position throughout IMRT and 3D treatments without excessive radiation exposure.  I am ordering optical surface tracking for this patient's upcoming course of radiotherapy. ________________________________  Kyung Rudd, MD 06/28/2017 2:12 PM    Reference:   Particia Jasper, et al. Surface imaging-based analysis of intrafraction motion for breast radiotherapy patients.Journal of [place], n. 6, nov. 2014. ISSN [id number].   Available at: <http://www.jacmp.org/index.php/jacmp/article/view/4957>.

## 2017-06-29 ENCOUNTER — Telehealth: Payer: Self-pay | Admitting: Hematology

## 2017-06-29 NOTE — Telephone Encounter (Signed)
Left message for patient regarding upcoming March appointments.  °

## 2017-06-30 DIAGNOSIS — C50411 Malignant neoplasm of upper-outer quadrant of right female breast: Secondary | ICD-10-CM | POA: Diagnosis not present

## 2017-06-30 DIAGNOSIS — Z510 Encounter for antineoplastic radiation therapy: Secondary | ICD-10-CM | POA: Diagnosis not present

## 2017-07-04 ENCOUNTER — Ambulatory Visit
Admission: RE | Admit: 2017-07-04 | Discharge: 2017-07-04 | Disposition: A | Discharge status: Home / Self Care | Payer: 59 | Source: Ambulatory Visit | Attending: Radiation Oncology | Admitting: Radiation Oncology

## 2017-07-04 DIAGNOSIS — C50411 Malignant neoplasm of upper-outer quadrant of right female breast: Secondary | ICD-10-CM | POA: Diagnosis not present

## 2017-07-04 DIAGNOSIS — Z510 Encounter for antineoplastic radiation therapy: Secondary | ICD-10-CM | POA: Diagnosis not present

## 2017-07-05 ENCOUNTER — Ambulatory Visit
Admission: RE | Admit: 2017-07-05 | Discharge: 2017-07-05 | Disposition: A | Discharge status: Home / Self Care | Payer: 59 | Source: Ambulatory Visit | Attending: Radiation Oncology | Admitting: Radiation Oncology

## 2017-07-05 DIAGNOSIS — Z510 Encounter for antineoplastic radiation therapy: Secondary | ICD-10-CM | POA: Diagnosis not present

## 2017-07-05 DIAGNOSIS — C50411 Malignant neoplasm of upper-outer quadrant of right female breast: Secondary | ICD-10-CM | POA: Diagnosis not present

## 2017-07-06 ENCOUNTER — Ambulatory Visit
Admission: RE | Admit: 2017-07-06 | Discharge: 2017-07-06 | Disposition: A | Discharge status: Home / Self Care | Payer: 59 | Source: Ambulatory Visit | Attending: Radiation Oncology | Admitting: Radiation Oncology

## 2017-07-06 DIAGNOSIS — Z510 Encounter for antineoplastic radiation therapy: Secondary | ICD-10-CM | POA: Diagnosis not present

## 2017-07-06 DIAGNOSIS — C50411 Malignant neoplasm of upper-outer quadrant of right female breast: Secondary | ICD-10-CM | POA: Diagnosis not present

## 2017-07-07 ENCOUNTER — Ambulatory Visit
Admission: RE | Admit: 2017-07-07 | Discharge: 2017-07-07 | Disposition: A | Discharge status: Home / Self Care | Payer: 59 | Source: Ambulatory Visit | Attending: Radiation Oncology | Admitting: Radiation Oncology

## 2017-07-07 DIAGNOSIS — C50411 Malignant neoplasm of upper-outer quadrant of right female breast: Secondary | ICD-10-CM

## 2017-07-07 DIAGNOSIS — Z170 Estrogen receptor positive status [ER+]: Principal | ICD-10-CM

## 2017-07-07 DIAGNOSIS — Z510 Encounter for antineoplastic radiation therapy: Secondary | ICD-10-CM | POA: Diagnosis not present

## 2017-07-07 MED ORDER — RADIAPLEXRX EX GEL
Freq: Two times a day (BID) | CUTANEOUS | Status: DC
  Administered 2017-07-07: 17:00:00 via TOPICAL

## 2017-07-07 MED ORDER — ALRA NON-METALLIC DEODORANT (RAD-ONC)
1.0000 "application " | Freq: Once | TOPICAL | Status: AC
  Administered 2017-07-07: 1 via TOPICAL

## 2017-07-07 NOTE — Progress Notes (Signed)

## 2017-07-10 ENCOUNTER — Ambulatory Visit
Admission: RE | Admit: 2017-07-10 | Discharge: 2017-07-10 | Disposition: A | Discharge status: Home / Self Care | Payer: 59 | Source: Ambulatory Visit | Attending: Radiation Oncology | Admitting: Radiation Oncology

## 2017-07-10 DIAGNOSIS — C50411 Malignant neoplasm of upper-outer quadrant of right female breast: Secondary | ICD-10-CM | POA: Diagnosis not present

## 2017-07-10 DIAGNOSIS — Z510 Encounter for antineoplastic radiation therapy: Secondary | ICD-10-CM | POA: Diagnosis not present

## 2017-07-11 ENCOUNTER — Ambulatory Visit
Admission: RE | Admit: 2017-07-11 | Discharge: 2017-07-11 | Disposition: A | Discharge status: Home / Self Care | Payer: 59 | Source: Ambulatory Visit | Attending: Radiation Oncology | Admitting: Radiation Oncology

## 2017-07-11 DIAGNOSIS — C50411 Malignant neoplasm of upper-outer quadrant of right female breast: Secondary | ICD-10-CM | POA: Diagnosis not present

## 2017-07-11 DIAGNOSIS — Z510 Encounter for antineoplastic radiation therapy: Secondary | ICD-10-CM | POA: Diagnosis not present

## 2017-07-12 ENCOUNTER — Ambulatory Visit
Admission: RE | Admit: 2017-07-12 | Discharge: 2017-07-12 | Disposition: A | Discharge status: Home / Self Care | Payer: 59 | Source: Ambulatory Visit | Attending: Radiation Oncology | Admitting: Radiation Oncology

## 2017-07-12 DIAGNOSIS — C50411 Malignant neoplasm of upper-outer quadrant of right female breast: Secondary | ICD-10-CM | POA: Diagnosis not present

## 2017-07-12 DIAGNOSIS — Z510 Encounter for antineoplastic radiation therapy: Secondary | ICD-10-CM | POA: Diagnosis not present

## 2017-07-12 DIAGNOSIS — Z170 Estrogen receptor positive status [ER+]: Secondary | ICD-10-CM | POA: Diagnosis not present

## 2017-07-13 ENCOUNTER — Ambulatory Visit
Admission: RE | Admit: 2017-07-13 | Discharge: 2017-07-13 | Disposition: A | Discharge status: Home / Self Care | Payer: 59 | Source: Ambulatory Visit | Attending: Radiation Oncology | Admitting: Radiation Oncology

## 2017-07-13 DIAGNOSIS — C50411 Malignant neoplasm of upper-outer quadrant of right female breast: Secondary | ICD-10-CM | POA: Diagnosis not present

## 2017-07-13 DIAGNOSIS — Z510 Encounter for antineoplastic radiation therapy: Secondary | ICD-10-CM | POA: Diagnosis not present

## 2017-07-14 ENCOUNTER — Ambulatory Visit
Admission: RE | Admit: 2017-07-14 | Discharge: 2017-07-14 | Disposition: A | Discharge status: Home / Self Care | Payer: 59 | Source: Ambulatory Visit | Attending: Radiation Oncology | Admitting: Radiation Oncology

## 2017-07-14 DIAGNOSIS — Z170 Estrogen receptor positive status [ER+]: Secondary | ICD-10-CM | POA: Insufficient documentation

## 2017-07-14 DIAGNOSIS — C50411 Malignant neoplasm of upper-outer quadrant of right female breast: Secondary | ICD-10-CM | POA: Insufficient documentation

## 2017-07-17 ENCOUNTER — Ambulatory Visit
Admission: RE | Admit: 2017-07-17 | Discharge: 2017-07-17 | Disposition: A | Discharge status: Home / Self Care | Payer: 59 | Source: Ambulatory Visit | Attending: Radiation Oncology | Admitting: Radiation Oncology

## 2017-07-17 DIAGNOSIS — C50411 Malignant neoplasm of upper-outer quadrant of right female breast: Secondary | ICD-10-CM | POA: Diagnosis not present

## 2017-07-18 ENCOUNTER — Ambulatory Visit
Admission: RE | Admit: 2017-07-18 | Discharge: 2017-07-18 | Disposition: A | Discharge status: Home / Self Care | Payer: 59 | Source: Ambulatory Visit | Attending: Radiation Oncology | Admitting: Radiation Oncology

## 2017-07-18 DIAGNOSIS — C50411 Malignant neoplasm of upper-outer quadrant of right female breast: Secondary | ICD-10-CM | POA: Diagnosis not present

## 2017-07-19 ENCOUNTER — Ambulatory Visit
Admission: RE | Admit: 2017-07-19 | Discharge: 2017-07-19 | Disposition: A | Discharge status: Home / Self Care | Payer: 59 | Source: Ambulatory Visit | Attending: Radiation Oncology | Admitting: Radiation Oncology

## 2017-07-19 DIAGNOSIS — C50411 Malignant neoplasm of upper-outer quadrant of right female breast: Secondary | ICD-10-CM | POA: Diagnosis not present

## 2017-07-20 ENCOUNTER — Ambulatory Visit
Admission: RE | Admit: 2017-07-20 | Discharge: 2017-07-20 | Disposition: A | Discharge status: Home / Self Care | Payer: 59 | Source: Ambulatory Visit | Attending: Radiation Oncology | Admitting: Radiation Oncology

## 2017-07-20 DIAGNOSIS — C50411 Malignant neoplasm of upper-outer quadrant of right female breast: Secondary | ICD-10-CM | POA: Diagnosis not present

## 2017-07-21 ENCOUNTER — Ambulatory Visit: Payer: 59 | Admitting: Physical Therapy

## 2017-07-21 ENCOUNTER — Ambulatory Visit
Admission: RE | Admit: 2017-07-21 | Discharge: 2017-07-21 | Disposition: A | Discharge status: Home / Self Care | Payer: 59 | Source: Ambulatory Visit | Attending: Radiation Oncology | Admitting: Radiation Oncology

## 2017-07-21 DIAGNOSIS — C50411 Malignant neoplasm of upper-outer quadrant of right female breast: Secondary | ICD-10-CM | POA: Diagnosis not present

## 2017-07-24 ENCOUNTER — Ambulatory Visit
Admission: RE | Admit: 2017-07-24 | Discharge: 2017-07-24 | Disposition: A | Discharge status: Home / Self Care | Payer: 59 | Source: Ambulatory Visit | Attending: Radiation Oncology | Admitting: Radiation Oncology

## 2017-07-24 DIAGNOSIS — C50411 Malignant neoplasm of upper-outer quadrant of right female breast: Secondary | ICD-10-CM | POA: Diagnosis not present

## 2017-07-25 ENCOUNTER — Encounter: Payer: Self-pay | Admitting: Hematology

## 2017-07-25 ENCOUNTER — Ambulatory Visit
Admission: RE | Admit: 2017-07-25 | Discharge: 2017-07-25 | Disposition: A | Discharge status: Home / Self Care | Payer: 59 | Source: Ambulatory Visit | Attending: Radiation Oncology | Admitting: Radiation Oncology

## 2017-07-25 DIAGNOSIS — C50411 Malignant neoplasm of upper-outer quadrant of right female breast: Secondary | ICD-10-CM

## 2017-07-25 DIAGNOSIS — Z170 Estrogen receptor positive status [ER+]: Principal | ICD-10-CM

## 2017-07-25 MED ORDER — SONAFINE EX EMUL
1.0000 "application " | Freq: Two times a day (BID) | CUTANEOUS | Status: DC
  Administered 2017-07-25: 1 via TOPICAL

## 2017-07-26 ENCOUNTER — Ambulatory Visit
Admission: RE | Admit: 2017-07-26 | Discharge: 2017-07-26 | Disposition: A | Discharge status: Home / Self Care | Payer: 59 | Source: Ambulatory Visit | Attending: Radiation Oncology | Admitting: Radiation Oncology

## 2017-07-26 DIAGNOSIS — C50411 Malignant neoplasm of upper-outer quadrant of right female breast: Secondary | ICD-10-CM | POA: Diagnosis not present

## 2017-07-27 ENCOUNTER — Ambulatory Visit
Admission: RE | Admit: 2017-07-27 | Discharge: 2017-07-27 | Disposition: A | Discharge status: Home / Self Care | Payer: 59 | Source: Ambulatory Visit | Attending: Radiation Oncology | Admitting: Radiation Oncology

## 2017-07-27 ENCOUNTER — Other Ambulatory Visit: Payer: Self-pay | Admitting: *Deleted

## 2017-07-27 ENCOUNTER — Telehealth: Payer: Self-pay | Admitting: *Deleted

## 2017-07-27 DIAGNOSIS — C50411 Malignant neoplasm of upper-outer quadrant of right female breast: Secondary | ICD-10-CM | POA: Diagnosis not present

## 2017-07-27 MED ORDER — VENLAFAXINE HCL ER 75 MG PO CP24
75.0000 mg | ORAL_CAPSULE | Freq: Every day | ORAL | 1 refills | Status: DC
Start: 2017-07-27 — End: 2017-09-10

## 2017-07-27 NOTE — Telephone Encounter (Signed)
Called pt & informed to new script for effexor XR to take daily for hot flashes preferably at hs due to sedation along with neurontin.  Informed of interaction with cymbalta but Dr Burr Medico thinks it will be ok to take both.  Informed that Dr Burr Medico wants to see her after her XRT.  She reports having appt already for March 7.  Script escribed to pharmacy.

## 2017-07-28 ENCOUNTER — Ambulatory Visit
Admission: RE | Admit: 2017-07-28 | Discharge: 2017-07-28 | Disposition: A | Discharge status: Home / Self Care | Payer: 59 | Source: Ambulatory Visit | Attending: Radiation Oncology | Admitting: Radiation Oncology

## 2017-07-28 DIAGNOSIS — C50411 Malignant neoplasm of upper-outer quadrant of right female breast: Secondary | ICD-10-CM | POA: Diagnosis not present

## 2017-07-31 ENCOUNTER — Ambulatory Visit
Admission: RE | Admit: 2017-07-31 | Discharge: 2017-07-31 | Disposition: A | Discharge status: Home / Self Care | Payer: 59 | Source: Ambulatory Visit | Attending: Radiation Oncology | Admitting: Radiation Oncology

## 2017-07-31 DIAGNOSIS — C50411 Malignant neoplasm of upper-outer quadrant of right female breast: Secondary | ICD-10-CM | POA: Diagnosis not present

## 2017-08-01 ENCOUNTER — Ambulatory Visit
Admission: RE | Admit: 2017-08-01 | Discharge: 2017-08-01 | Disposition: A | Discharge status: Home / Self Care | Payer: 59 | Source: Ambulatory Visit | Attending: Radiation Oncology | Admitting: Radiation Oncology

## 2017-08-01 DIAGNOSIS — C50411 Malignant neoplasm of upper-outer quadrant of right female breast: Secondary | ICD-10-CM | POA: Diagnosis not present

## 2017-08-02 ENCOUNTER — Ambulatory Visit
Admission: RE | Admit: 2017-08-02 | Discharge: 2017-08-02 | Disposition: A | Discharge status: Home / Self Care | Payer: 59 | Source: Ambulatory Visit | Attending: Radiation Oncology | Admitting: Radiation Oncology

## 2017-08-02 DIAGNOSIS — C50411 Malignant neoplasm of upper-outer quadrant of right female breast: Secondary | ICD-10-CM | POA: Diagnosis not present

## 2017-08-03 ENCOUNTER — Ambulatory Visit
Admission: RE | Admit: 2017-08-03 | Discharge: 2017-08-03 | Disposition: A | Discharge status: Home / Self Care | Payer: 59 | Source: Ambulatory Visit | Attending: Radiation Oncology | Admitting: Radiation Oncology

## 2017-08-03 DIAGNOSIS — C50411 Malignant neoplasm of upper-outer quadrant of right female breast: Secondary | ICD-10-CM | POA: Diagnosis not present

## 2017-08-04 ENCOUNTER — Ambulatory Visit: Payer: 59

## 2017-08-04 ENCOUNTER — Ambulatory Visit: Payer: 59 | Admitting: Radiation Oncology

## 2017-08-07 ENCOUNTER — Ambulatory Visit
Admission: RE | Admit: 2017-08-07 | Discharge: 2017-08-07 | Disposition: A | Discharge status: Home / Self Care | Payer: 59 | Source: Ambulatory Visit | Attending: Radiation Oncology | Admitting: Radiation Oncology

## 2017-08-07 DIAGNOSIS — C50411 Malignant neoplasm of upper-outer quadrant of right female breast: Secondary | ICD-10-CM | POA: Diagnosis not present

## 2017-08-08 ENCOUNTER — Ambulatory Visit: Payer: 59 | Admitting: Radiation Oncology

## 2017-08-08 ENCOUNTER — Ambulatory Visit
Admission: RE | Admit: 2017-08-08 | Discharge: 2017-08-08 | Disposition: A | Discharge status: Home / Self Care | Payer: 59 | Source: Ambulatory Visit | Attending: Radiation Oncology | Admitting: Radiation Oncology

## 2017-08-08 DIAGNOSIS — C50411 Malignant neoplasm of upper-outer quadrant of right female breast: Secondary | ICD-10-CM | POA: Diagnosis not present

## 2017-08-09 ENCOUNTER — Ambulatory Visit
Admission: RE | Admit: 2017-08-09 | Discharge: 2017-08-09 | Disposition: A | Discharge status: Home / Self Care | Payer: 59 | Source: Ambulatory Visit | Attending: Radiation Oncology | Admitting: Radiation Oncology

## 2017-08-09 DIAGNOSIS — C50411 Malignant neoplasm of upper-outer quadrant of right female breast: Secondary | ICD-10-CM | POA: Diagnosis not present

## 2017-08-10 ENCOUNTER — Ambulatory Visit
Admission: RE | Admit: 2017-08-10 | Discharge: 2017-08-10 | Disposition: A | Discharge status: Home / Self Care | Payer: 59 | Source: Ambulatory Visit | Attending: Radiation Oncology | Admitting: Radiation Oncology

## 2017-08-10 DIAGNOSIS — C50411 Malignant neoplasm of upper-outer quadrant of right female breast: Secondary | ICD-10-CM | POA: Diagnosis not present

## 2017-08-11 ENCOUNTER — Ambulatory Visit: Payer: 59

## 2017-08-11 ENCOUNTER — Telehealth: Payer: Self-pay | Admitting: Hematology

## 2017-08-11 ENCOUNTER — Ambulatory Visit
Admission: RE | Admit: 2017-08-11 | Discharge: 2017-08-11 | Disposition: A | Discharge status: Home / Self Care | Payer: 59 | Source: Ambulatory Visit | Attending: Radiation Oncology | Admitting: Radiation Oncology

## 2017-08-11 DIAGNOSIS — Z170 Estrogen receptor positive status [ER+]: Secondary | ICD-10-CM | POA: Diagnosis not present

## 2017-08-11 DIAGNOSIS — Z510 Encounter for antineoplastic radiation therapy: Secondary | ICD-10-CM | POA: Diagnosis not present

## 2017-08-11 DIAGNOSIS — C50411 Malignant neoplasm of upper-outer quadrant of right female breast: Secondary | ICD-10-CM | POA: Diagnosis present

## 2017-08-11 NOTE — Telephone Encounter (Signed)
Per 3/1 schd msg appointment for 3/7 moved to 3/5.  Patient was notified

## 2017-08-14 ENCOUNTER — Ambulatory Visit: Payer: 59

## 2017-08-14 ENCOUNTER — Ambulatory Visit
Admission: RE | Admit: 2017-08-14 | Discharge: 2017-08-14 | Disposition: A | Discharge status: Home / Self Care | Payer: 59 | Source: Ambulatory Visit | Attending: Radiation Oncology | Admitting: Radiation Oncology

## 2017-08-14 DIAGNOSIS — C50411 Malignant neoplasm of upper-outer quadrant of right female breast: Secondary | ICD-10-CM | POA: Diagnosis not present

## 2017-08-14 NOTE — Progress Notes (Signed)
[facility]  [phone]) [phone] Fax:([phone]) [phone]  Clinic Follow up Note   Patient Care Team: Orpah Melter, MD as PCP - General (Family Medicine) Truitt Merle, MD as Consulting Physician (Hematology) [name], MD as Consulting Physician (General Surgery) Kyung Rudd, MD as Consulting Physician (Radiation Oncology) Irene Limbo, MD as Consulting Physician (Plastic Surgery) 08/15/2017  CHIEF COMPLAINTS:  Follow up right breast cancer, ER/PR +, HER2 -  Oncology History   Cancer Staging Malignant neoplasm of upper-outer quadrant of right breast, estrogen receptor positive ([place]) Staging form: Breast, AJCC 8th Edition - Clinical stage from 04/12/2017: Stage IIA (cT3, cN0, cM0, G1, ER: Positive, PR: Positive, HER2: Negative) - Signed by Truitt Merle, MD on 04/16/2017 - Pathologic: Stage IA (pT2, pN0(sn), cM0, G1, ER: Positive, PR: Positive, HER2: Negative, Oncotype DX score: 12) - Unsigned       Malignant neoplasm of upper-outer quadrant of right breast, estrogen receptor positive ([place])   04/07/2017 Mammogram    IMPRESSION: 1. Highly suspicious mass within the upper right breast, 12 to 12:30 o'clock axis, at posterior depth, with associated architectural distortion and pleomorphic calcifications. The mass measures 1 cm greatest dimension. The mass and associated microcalcifications measure 1.4 cm. No sonographic correlate identified. Recommend stereotactic biopsy using 3D tomosynthesis guidance. 2. Additional punctate and coarse heterogeneous calcifications within the upper outer quadrant of the right breast, 11-12 o'clock axis region, at middle depth, with a suspicious segmental distribution, spanning approximately 5 cm, the most suspicious calcifications best seen on the CC magnification views. Recommend additional stereotactic biopsy for these right breast calcifications to exclude multifocal/multicentric disease and/or define extent of disease.      04/12/2017 Initial Biopsy    Diagnosis 1. 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 - SEE COMMENT. 2. Breast, right, needle core biopsy, loosely grouped calcs in more central, upper outer quadrant - DUCTAL CARCINOMA IN SITU WITH CALCIFICATIONS. - LOBULAR NEOPLASIA (ATYPICAL LOBULAR HYPERPLASIA). - FIBROCYSTIC CHANGES WITH ADENOSIS AND CALCIFICATIONS.      04/12/2017 Receptors her2    Estrogen Receptor: 95%, POSITIVE, STRONG STAINING INTENSITY Progesterone Receptor: 95%, POSITIVE, STRONG STAINING INTENSITY Proliferation Marker Ki67: 3% HER2: NEGATIVE      04/16/2017 Initial Diagnosis    Malignant neoplasm of upper-outer quadrant of right breast, estrogen receptor positive ([place])      05/11/2017 Surgery    Right breast lumpectomy and SLN biopsy       05/11/2017 Pathology Results    Right lumpectomy showed a 2.1cm invasive ductal carcinoma, margins are negative, (+)ADH, 6 nodes are all negative       05/11/2017 Oncotype testing    RS 12, predicts 9-year distant recurrence 3% with tamoxifen or AI       05/26/2017 Surgery    Breast reduction by Dr. Iran Planas       07/06/2017 -  Radiation Therapy    Adjuvant breast radiation         HISTORY OF PRESENTING ILLNESS: 04/19/17 Felicia Bauer 57 y.o. female is here because of newly diagnosed right breast cancer. She presents to Breast Clinic today by herself and arrived with her son.   In the past she had spinal fusion twice in her cervical spine. She takes gabapentin and Cymbalta for her pain. She had tubal ligation, cesarean surgery and cervical ablations. She does have mitral valve prolapse. Her mother had colon cancer, diagnosed at 17 yo.  Today she reports her lump was found by screening mammogram which she gets yearly. She notes she has recent swelling in her legs, R>L. She denies any other  recent change, SOB, or new pain. 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 She is seperated and has one son and one daughter. She still has periods.   CURRENT THERAPY: Adjuvant breast radiation  INTERIM HISTORY: Felicia Bauer returns for follow-up.  She is finishing her breast radiation this week.  She has been tolerating therapy well, moderate skin inflammation from radiation, no other complaints.  She feels well overall, mild arthritis are stable,    MEDICAL HISTORY:  Past Medical History:  Diagnosis Date  . Arthritis    knee   . Breast cancer, right ([place]) 04/2017  . Complication of anesthesia    hard to wake up post-op; itching after anesthesia  . GERD (gastroesophageal reflux disease)   . History of vertigo   . Hypertension    states under control with med., has been on med. > 10 yr.  . Mitral valve prolapse   . Panic attacks   . Wears partial dentures    upper    SURGICAL HISTORY: Past Surgical History:  Procedure Laterality Date  . ANTERIOR CERVICAL DECOMP/DISCECTOMY FUSION N/A 05/24/2013   Procedure: CERVICAL FOUR-FIVE, CERVICAL FIVE-SIX, CERVICAL SIX-SEVEN ANTERIOR CERVICAL DECOMPRESSION/DISCECTOMY FUSION 3 LEVELS;  Surgeon: Faythe Ghee, MD;  Location: [facility];  Service: Neurosurgery;  Laterality: N/A;  . ANTERIOR CERVICAL DECOMP/DISCECTOMY FUSION  10/27/2016   C3-4  . BREAST REDUCTION SURGERY Bilateral 05/26/2017   Procedure: LEFT BREAST MAMMARY REDUCTION  (BREAST) AND RIGHT ONCOPLASTIC RECONSTRUCTION;  Surgeon: Irene Limbo, MD;  Location: [facility];  Service: Plastics;  Laterality: Bilateral;  . CESAREAN SECTION     x 2  . DILATION AND CURETTAGE OF UTERUS  05/30/2008  . ENDOMETRIAL ABLATION  05/30/2008  . RADIOACTIVE SEED GUIDED PARTIAL MASTECTOMY WITH AXILLARY SENTINEL LYMPH NODE BIOPSY Right 05/11/2017   Procedure: RIGHT BRACKETED RADIOACTIVE SEED GUIDED PARTIAL MASTECTOMY WITH  SENTINEL LYMPH NODE BIOPSY WITH ERAS PATHWAY;  Surgeon:  [name], MD;  Location: [facility];  Service: General;  Laterality: Right;  PEC BLOCK  . TUBAL LIGATION      SOCIAL HISTORY: Social History   Socioeconomic History  . Marital status: Legally Separated    Spouse name: Not on file  . Number of children: 2  . Years of education: Not on file  . Highest education level: Not on file  Social Needs  . Financial resource strain: Not on file  . Food insecurity - worry: Not on file  . Food insecurity - inability: Not on file  . Transportation needs - medical: Not on file  . Transportation needs - non-medical: Not on file  Occupational History  . Not on file  Tobacco Use  . Smoking status: Never Smoker  . Smokeless tobacco: Never Used  Substance and Sexual Activity  . Alcohol use: No  . Drug use: No  . Sexual activity: Not on file  Other Topics Concern  . Not on file  Social History Narrative  . Not on file    FAMILY HISTORY: Family History  Problem Relation Age of Onset  . Cancer Mother 50       colon cancer     ALLERGIES:  has No Known Allergies.  MEDICATIONS:  Current Outpatient Medications  Medication Sig Dispense Refill  . celecoxib (CELEBREX) 200 MG capsule Take 200 mg by mouth  daily.     . clonazePAM (KLONOPIN) 1 MG tablet Take 0.5 mg by mouth as needed for anxiety.    . diazepam (VALIUM) 2 MG tablet TAKE '2MG'$  BY MOUTH DAILY AS NEEDED    . DULoxetine (CYMBALTA) 30 MG capsule TAKE ONE CAPSULE('30mg'$ ) BY MOUTH EVERY DAY    . fexofenadine-pseudoephedrine (ALLEGRA-D 24) 180-240 MG per 24 hr tablet Take 1 tablet by mouth daily as needed (for sinus).    . gabapentin (NEURONTIN) 600 MG tablet Take 1 tablet (600 mg total) by mouth 3 (three) times daily. 100 tablet 2  . hydrochlorothiazide (HYDRODIURIL) 25 MG tablet Take 25 mg by mouth daily.    Marland Kitchen HYDROcodone-acetaminophen (NORCO) 5-325 MG tablet Take 1-2 tablets by mouth every 4 (four) hours as needed for moderate pain. 30 tablet 0  . pantoprazole (PROTONIX)  40 MG tablet Take 40 mg daily by mouth.  2  . venlafaxine XR (EFFEXOR-XR) 75 MG 24 hr capsule Take 1 capsule (75 mg total) by mouth daily with breakfast. Take at bedtime since may cause drowsiness. 30 capsule 1   No current facility-administered medications for this visit.     REVIEW OF SYSTEMS:   Constitutional: Denies fevers, chills or abnormal night sweats Eyes: Denies blurriness of vision, double vision or watery eyes Ears, nose, mouth, throat, and face: Denies mucositis or sore throat Respiratory: Denies cough, dyspnea or wheezes Cardiovascular: Denies palpitation, chest discomfort (+0 bilateral lower extremity swelling, R>L Gastrointestinal:  Denies nausea, heartburn or change in bowel habits Skin: Denies abnormal skin rashes Lymphatics: Denies new lymphadenopathy or easy bruising Neurological:Denies numbness, tingling or new weaknesses Behavioral/Psych: Mood is stable, no new changes  All other systems were reviewed with the patient and are negative.  PHYSICAL EXAMINATION: ECOG PERFORMANCE STATUS: 0  Vitals:   08/15/17 1118  BP: 135/60  Pulse: 77  Resp: 18  Temp: 98.7 F (37.1 C)  SpO2: 100%   Filed Weights   08/15/17 1118  Weight: 190 lb 6.4 oz (86.4 kg)    GENERAL:alert, no distress and comfortable SKIN: skin color, texture, turgor are normal, no rashes or significant lesions EYES: normal, conjunctiva are pink and non-injected, sclera clear OROPHARYNX:no exudate, no erythema and lips, buccal mucosa, and tongue normal  NECK: supple, thyroid normal size, non-tender, without nodularity LYMPH:  no palpable lymphadenopathy in the cervical, axillary or inguinal LUNGS: clear to auscultation and percussion with normal breathing effort HEART: regular rate & rhythm and no murmurs and no lower extremity edema ABDOMEN:abdomen soft, non-tender and normal bowel sounds Musculoskeletal:no cyanosis of digits and no clubbing  PSYCH: alert & oriented x 3 with fluent speech NEURO:  no focal motor/sensory deficits BREAST: Breast inspection showed them to be symmetrical with no nipple discharge.  Surgical scar in both right breasts have healed very well.  Diffuse skin hyperpigmentation of the right breast from radiation, no skin peeling or ulcers.  LABORATORY DATA:  I have reviewed the data as listed CBC Latest Ref Rng & Units 05/27/2017 05/26/2017 04/19/2017  WBC 4.0 - 10.5 K/uL 11.5(H) - 6.0  Hemoglobin 12.0 - 15.0 g/dL 9.1(L) 10.6(L) 13.1  Hematocrit 36.0 - 46.0 % 28.9(L) - 41.1  Platelets 150 - 400 K/uL 254 - 211    CMP Latest Ref Rng & Units 05/19/2017 04/19/2017 05/16/2013  Glucose 65 - 99 mg/dL 84 95 107(H)  BUN 6 - 20 mg/dL 12 17.2 14  Creatinine 0.44 - 1.00 mg/dL 0.63 0.8 0.70  Sodium 135 - 145 mmol/L 137 142 134(L)  Potassium  3.5 - 5.1 mmol/L 3.7 3.7 3.9  Chloride 101 - 111 mmol/L 102 - 98  CO2 22 - 32 mmol/L '27 28 29  '$ Calcium 8.9 - 10.3 mg/dL 9.1 9.8 9.4  Total Protein 6.4 - 8.3 g/dL - 7.6 -  Total Bilirubin 0.20 - 1.20 mg/dL - 0.47 -  Alkaline Phos 40 - 150 U/L - 58 -  AST 5 - 34 U/L - 17 -  ALT 0 - 55 U/L - 12 -    PATHOLOGY  Diagnosis 04/12/17 1. Breast, right, needle core biopsy, lateral aspect of upper outer quadrant, mass with calcs - INVASIVE DUCTAL CARCINOMA. - DUCTAL CARCINOMA IN SITU WITH CALCIFICATIONS. - SEE COMMENT. 2. Breast, right, needle core biopsy, loosely grouped calcs in more central, upper outer quadrant - DUCTAL CARCINOMA IN SITU WITH CALCIFICATIONS. - LOBULAR NEOPLASIA (ATYPICAL LOBULAR HYPERPLASIA). - FIBROCYSTIC CHANGES WITH ADENOSIS AND CALCIFICATIONS. - SEE COMMENT. 1 of 3 FINAL for Felicia Bauer, Felicia Bauer ([id number]) Microscopic Comment 1. The carcinoma appears grade I. A breast prognostic profile will be performed and the results reported separately. The results were called to the [facility] on 04/13/17. 2. The carcinoma is low grade. Estrogen receptor and progesterone receptor studies will be performed  and the results reported separately. The results were called to the [facility] on 04/13/17. (JBK:kh 04/13/17) 1. FLUORESCENCE IN-SITU HYBRIDIZATION Results: HER2 - NEGATIVE RATIO OF HER2/CEP17 SIGNALS 1.28 AVERAGE HER2 COPY NUMBER PER CELL 2.50 Results: IMMUNOHISTOCHEMICAL AND MORPHOMETRIC ANALYSIS PERFORMED MANUALLY Estrogen Receptor: 95%, POSITIVE, STRONG STAINING INTENSITY Progesterone Receptor: 95%, POSITIVE, STRONG STAINING INTENSITY Proliferation Marker Ki67: 3%  Diagnosis 05/11/2017 1. Breast, lumpectomy, Far Right Lateral Mass - INVASIVE DUCTAL CARCINOMA, GRADE 1, SPANNING 2.1 CM. - ATYPICAL DUCTAL HYPERPLASIA. - INVASIVE CARCINOMA COMES TO WITHIN 0.1 CM OF THE ANTERIOR MARGIN FOCALLY. - BIOPSY SITE. - SEE ONCOLOGY TABLE. 2. Breast, lumpectomy, Right Upper Outer Quadrant Medial - FIBROCYSTIC CHANGE WITH CALCIFICATIONS. - BIOPSY SITE. - NO RESIDUAL CARCINOMA IDENTIFIED. 3. Lymph node, sentinel, biopsy, Right Axillary #1 - ONE OF ONE LYMPH NODES NEGATIVE FOR CARCINOMA (0/1). 4. Lymph node, sentinel, biopsy, Right Axillary #2 - ONE OF ONE LYMPH NODES NEGATIVE FOR CARCINOMA (0/1). 5. Lymph node, sentinel, biopsy, Right Axillary #3 - ONE OF ONE LYMPH NODES NEGATIVE FOR CARCINOMA (0/1). 6. Lymph node, sentinel, biopsy, Right Axillary #4 - ONE OF ONE LYMPH NODES NEGATIVE FOR CARCINOMA (0/1). 7. Lymph node, sentinel, biopsy, Right Axillary #5 - ONE OF ONE LYMPH NODES NEGATIVE FOR CARCINOMA (0/1). 8. Lymph node, sentinel, biopsy, Right Axillary #6 - ONE OF ONE LYMPH NODES NEGATIVE FOR CARCINOMA (0/1). Microscopic Comment 1. BREAST, INVASIVE TUMOR Procedure: Right breast lumpectomy (far lateral) and right axillary sentinel lymph node biopsies. Laterality: Right. Tumor Size: 2.1 cm. Histologic Type: Invasive ductal carcinoma. Grade: 1 1 of 4 FINAL for Felicia Bauer, Felicia Bauer ([id number]) Microscopic Comment(continued) Tubular Differentiation: 1 Nuclear  Pleomorphism: 2 Mitotic Count: 1 Ductal Carcinoma in Situ (DCIS): No residual DCIS. Extent of Tumor: Confined to breast parenchyma. Margins: Invasive carcinoma, distance from closest margin: 0.1 cm to anterior focally. DCIS, distance from closest margin: N/A. Regional Lymph Nodes: Number of Lymph Nodes Examined: 6 Number of Sentinel Lymph Nodes Examined: 6 Lymph Nodes with Macrometastases: 0 Lymph Nodes with Micrometastases: 0 Lymph Nodes with Isolated Tumor Cells: 0 Breast Prognostic Profile: Performed on biopsy [id number], see below. Will not be repeated. Estrogen Receptor: Positive, 95% strong. Progesterone Receptor: Positive, 95% strong. Her2: Negative (ratio 1.28). Ki-67: 3% Best tumor block  for sendout testing: 1A Pathologic Stage Classification (pTNM, AJCC 8th Edition): Primary Tumor (pT): pT2 Regional Lymph Nodes (pN): pN0 Distant Metastases (pM): pMX Comments: None.   RADIOGRAPHIC STUDIES: I have personally reviewed the radiological images as listed and agreed with the findings in the report. No results found.  ASSESSMENT & PLAN:  Felicia Bauer is a 57 y.o. premenopausal African-American female with a history of HTN an d mitral valve disorder.    1. Malignant neoplasm of upper-outer quadrant of right breast, invasive ductal carcinoma, stage IA, pT2N0M0, ER+/PR +, HER2 -, Grade I, and DCIS ER+/PR+, RS 12 --I discussed her surgical path result in details, she had completed surgical resection with negative margins and the notes. -the Oncotype Dx result was reviewed with her in details. She has low risk based on the recurrence score, which predicts 9 year distant recurrence after 5 years of tamoxifen 3%.  No benefit of adjuvant chemotherapy.  -Giving the strong ER and PR expression in her tumor I recommend adjuvant endocrine therapy with tamoxifen for 10 years or AI for 5 years if she is postmenopausal.  She has not had.  For a year, I will check her [place] level today to  see if she is postmenopausal.  --The potential side effects of tamoxifen and AI, which includes but not limited to, hot flash, skin and vaginal dryness, slightly increased risk of cardiovascular disease and cataract, small risk of thrombosis and endometrial cancer from tamoxifen, arthralgia, osteopenia and osteoporosis from AI, were discussed with her in great details. Preventive strategies for thrombosis, such as being physically active, using compression stocks, avoid cigarette smoking, etc., were reviewed with her. I also recommend her to follow-up with her gynecologist once a year, and watch for vaginal spotting or bleeding, as a clinically sign of endometrial cancer, etc. She voiced good understanding, and agrees to proceed.  -She is finishing adjuvant radiation to this week. -Plan to start tamoxifen or anastrozole in 2-3 weeks. -She has had hot flash lately since she came off the over-the-counter medicine for her hot flash when she was diagnosed with cancer.  I have started her on Effexor 75 mg daily 2-3 weeks ago, her health rash has slightly improved.  2.  Hypertension, GERD, anxiety -Follow-up with primary care physician  3.  Hot flash -She was on over-the-counter medication for her hot flash, which she stopped when she was diagnosed with cancer.  Her health rash has gotten worse -I started her on Effexor in February 2019, blood pressure has slightly improved.  Titrate her dose if needed.  PLAN:  -Lab today for [facility] and estradiol level -If she is perimenopausal, I will call in tamoxifen, otherwise I will call in anastrozole for her.  She was started in 2-3 weeks -If her hot flash gets worse with antiestrogen therapy, I will increase her Effexor to 150 mg daily -Survivorship in 2 months, lab and follow-up with me in 5 months    No orders of the defined types were placed in this encounter.   All questions were answered. The patient knows to call the clinic with any problems, questions  or concerns. I spent 25 minutes counseling the patient face to face. The total time spent in the appointment was 30 minutes and more than 50% was on counseling.     Truitt Merle, MD 08/15/2017 11:41 AM

## 2017-08-15 ENCOUNTER — Inpatient Hospital Stay: Payer: 59

## 2017-08-15 ENCOUNTER — Ambulatory Visit
Admission: RE | Admit: 2017-08-15 | Discharge: 2017-08-15 | Disposition: A | Discharge status: Home / Self Care | Payer: 59 | Source: Ambulatory Visit | Attending: Radiation Oncology | Admitting: Radiation Oncology

## 2017-08-15 ENCOUNTER — Inpatient Hospital Stay: Payer: 59 | Attending: Hematology | Admitting: Hematology

## 2017-08-15 ENCOUNTER — Encounter: Payer: Self-pay | Admitting: Hematology

## 2017-08-15 VITALS — BP 135/60 | HR 77 | Temp 98.7°F | Resp 18 | Ht 61.0 in | Wt 190.4 lb

## 2017-08-15 DIAGNOSIS — Z170 Estrogen receptor positive status [ER+]: Principal | ICD-10-CM

## 2017-08-15 DIAGNOSIS — Z79899 Other long term (current) drug therapy: Secondary | ICD-10-CM | POA: Diagnosis not present

## 2017-08-15 DIAGNOSIS — R21 Rash and other nonspecific skin eruption: Secondary | ICD-10-CM | POA: Insufficient documentation

## 2017-08-15 DIAGNOSIS — M7989 Other specified soft tissue disorders: Secondary | ICD-10-CM | POA: Insufficient documentation

## 2017-08-15 DIAGNOSIS — K219 Gastro-esophageal reflux disease without esophagitis: Secondary | ICD-10-CM | POA: Diagnosis not present

## 2017-08-15 DIAGNOSIS — N951 Menopausal and female climacteric states: Secondary | ICD-10-CM | POA: Diagnosis not present

## 2017-08-15 DIAGNOSIS — F419 Anxiety disorder, unspecified: Secondary | ICD-10-CM | POA: Diagnosis not present

## 2017-08-15 DIAGNOSIS — Z800 Family history of malignant neoplasm of digestive organs: Secondary | ICD-10-CM | POA: Diagnosis not present

## 2017-08-15 DIAGNOSIS — I10 Essential (primary) hypertension: Secondary | ICD-10-CM | POA: Diagnosis not present

## 2017-08-15 DIAGNOSIS — C50411 Malignant neoplasm of upper-outer quadrant of right female breast: Secondary | ICD-10-CM | POA: Insufficient documentation

## 2017-08-16 ENCOUNTER — Ambulatory Visit
Admission: RE | Admit: 2017-08-16 | Discharge: 2017-08-16 | Disposition: A | Discharge status: Home / Self Care | Payer: 59 | Source: Ambulatory Visit | Attending: Radiation Oncology | Admitting: Radiation Oncology

## 2017-08-16 DIAGNOSIS — C50411 Malignant neoplasm of upper-outer quadrant of right female breast: Secondary | ICD-10-CM | POA: Diagnosis not present

## 2017-08-16 LAB — ESTRADIOL: ESTRADIOL: 8.5 pg/mL

## 2017-08-16 LAB — FOLLICLE STIMULATING HORMONE: FSH: 77.8 m[IU]/mL

## 2017-08-17 ENCOUNTER — Ambulatory Visit: Payer: 59 | Admitting: Hematology

## 2017-08-17 ENCOUNTER — Ambulatory Visit: Payer: 59

## 2017-08-17 ENCOUNTER — Ambulatory Visit
Admission: RE | Admit: 2017-08-17 | Discharge: 2017-08-17 | Disposition: A | Discharge status: Home / Self Care | Payer: 59 | Source: Ambulatory Visit | Attending: Radiation Oncology | Admitting: Radiation Oncology

## 2017-08-17 DIAGNOSIS — C50411 Malignant neoplasm of upper-outer quadrant of right female breast: Secondary | ICD-10-CM | POA: Diagnosis not present

## 2017-08-18 ENCOUNTER — Encounter: Payer: Self-pay | Admitting: Radiation Oncology

## 2017-08-18 ENCOUNTER — Ambulatory Visit
Admission: RE | Admit: 2017-08-18 | Discharge: 2017-08-18 | Disposition: A | Discharge status: Home / Self Care | Payer: 59 | Source: Ambulatory Visit | Attending: Radiation Oncology | Admitting: Radiation Oncology

## 2017-08-18 DIAGNOSIS — C50411 Malignant neoplasm of upper-outer quadrant of right female breast: Secondary | ICD-10-CM | POA: Diagnosis not present

## 2017-08-28 ENCOUNTER — Telehealth: Payer: Self-pay | Admitting: *Deleted

## 2017-08-28 ENCOUNTER — Other Ambulatory Visit: Payer: Self-pay | Admitting: *Deleted

## 2017-08-28 DIAGNOSIS — Z170 Estrogen receptor positive status [ER+]: Principal | ICD-10-CM

## 2017-08-28 DIAGNOSIS — C50411 Malignant neoplasm of upper-outer quadrant of right female breast: Secondary | ICD-10-CM

## 2017-08-28 MED ORDER — ANASTROZOLE 1 MG PO TABS: 1.0000 mg | ORAL_TABLET | Freq: Every day | ORAL | 2 refills | Status: DC

## 2017-08-28 NOTE — Telephone Encounter (Signed)
Called pt and left message on voice mail re:  Lab results showed pt is postmenopausal based on her [facility] level.  Informed pt that Anastrozole 1 mg will be sent to pt's pharmacy to start daily as per Dr. Ernestina Penna instructions.

## 2017-09-06 NOTE — Progress Notes (Signed)
  Radiation Oncology         [phone] ________________________________  Name: Felicia Bauer MRN: [id number]  Date: 08/18/2017  DOB: 05-18-1961  End of Treatment Note  Diagnosis:   57 y.o. female with Stage IA, pT2N0M0, grade 1, ER/PR positive invasive ductal carcinoma of the right breast   Indication for treatment:  Curative       Radiation treatment dates:   07/04/2017 - 08/18/2017  Site/dose:   The patient initially received a dose of 50.4 Gy in 28 fractions to the breast using whole-breast tangent fields. This was delivered using a 3-D conformal technique. The patient then received a boost to the seroma. This delivered an additional 10 Gy in 5 fractions using a 3-D technique. The total dose was 60.4 Gy.  Narrative: The patient tolerated radiation treatment relatively well.   The patient had some expected skin irritation as she progressed during treatment. Moist desquamation was not present at the end of treatment.  Plan: The patient has completed radiation treatment. The patient will return to radiation oncology clinic for routine followup in one month. I advised the patient to call or return sooner if they have any questions or concerns related to their recovery or treatment. ________________________________  Jodelle Gross, MD, PhD  This document serves as a record of services personally performed by Kyung Rudd, MD. It was created on his behalf by Rae Lips, a trained medical scribe. The creation of this record is based on the scribe's personal observations and the provider's statements to them. This document has been checked and approved by the attending provider.

## 2017-09-08 DIAGNOSIS — M4712 Other spondylosis with myelopathy, cervical region: Secondary | ICD-10-CM | POA: Diagnosis not present

## 2017-09-08 DIAGNOSIS — Z981 Arthrodesis status: Secondary | ICD-10-CM | POA: Diagnosis not present

## 2017-09-10 ENCOUNTER — Other Ambulatory Visit: Payer: Self-pay | Admitting: Hematology

## 2017-09-20 ENCOUNTER — Encounter: Payer: Self-pay | Admitting: Radiation Oncology

## 2017-09-20 ENCOUNTER — Other Ambulatory Visit: Payer: Self-pay

## 2017-09-20 ENCOUNTER — Ambulatory Visit
Admission: RE | Admit: 2017-09-20 | Discharge: 2017-09-20 | Disposition: A | Discharge status: Home / Self Care | Payer: 59 | Source: Ambulatory Visit | Attending: Radiation Oncology | Admitting: Radiation Oncology

## 2017-09-20 VITALS — BP 133/77 | HR 72 | Temp 98.8°F | Resp 20 | Ht 61.0 in | Wt 191.0 lb

## 2017-09-20 DIAGNOSIS — Z923 Personal history of irradiation: Secondary | ICD-10-CM | POA: Insufficient documentation

## 2017-09-20 DIAGNOSIS — Z780 Asymptomatic menopausal state: Secondary | ICD-10-CM | POA: Insufficient documentation

## 2017-09-20 DIAGNOSIS — C50411 Malignant neoplasm of upper-outer quadrant of right female breast: Secondary | ICD-10-CM | POA: Insufficient documentation

## 2017-09-20 DIAGNOSIS — Z170 Estrogen receptor positive status [ER+]: Secondary | ICD-10-CM | POA: Diagnosis present

## 2017-09-20 DIAGNOSIS — Z79899 Other long term (current) drug therapy: Secondary | ICD-10-CM | POA: Insufficient documentation

## 2017-09-20 NOTE — Progress Notes (Signed)
Radiation Oncology         [phone] ________________________________  Name: Felicia Bauer MRN: [id number]  Date of Service: 09/20/2017  DOB: 1961-01-28  Post Treatment Note  CC: Orpah Melter, MD  [name], MD  Diagnosis:   Stage IA,pT2N0M0, grade 1, ER/PR positive invasive ductal carcinomaof the right breast   Interval Since Last Radiation:  5 weeks   07/04/2017 - 08/18/2017: The patient initially received a dose of 50.4 Gy in 28 fractions to the breast using whole-breast tangent fields. This was delivered using a 3-D conformal technique. The patient then received a boost to the seroma. This delivered an additional 10 Gy in 5 fractions using a 3-D technique. The total dose was 60.4 Gy.   Narrative:  The patient returns today for routine follow-up. During treatment she did very well with radiotherapy and did not have significant desquamation.                             On review of systems, the patient states she's doing well but having hot flashes from Arimidex. She is taking Effexor XR with mild improvement.  ALLERGIES:  has No Known Allergies.  Meds: Current Outpatient Medications  Medication Sig Dispense Refill  . anastrozole (ARIMIDEX) 1 MG tablet Take 1 tablet (1 mg total) by mouth daily. 30 tablet 2  . celecoxib (CELEBREX) 200 MG capsule Take 200 mg by mouth daily.     . clonazePAM (KLONOPIN) 1 MG tablet Take 0.5 mg by mouth as needed for anxiety.    . DULoxetine (CYMBALTA) 30 MG capsule TAKE ONE CAPSULE(30mg ) BY MOUTH EVERY DAY    . fexofenadine-pseudoephedrine (ALLEGRA-D 24) 180-240 MG per 24 hr tablet Take 1 tablet by mouth daily as needed (for sinus).    . gabapentin (NEURONTIN) 600 MG tablet Take 1 tablet (600 mg total) by mouth 3 (three) times daily. 100 tablet 2  . hydrochlorothiazide (HYDRODIURIL) 25 MG tablet Take 25 mg by mouth daily.    Marland Kitchen HYDROcodone-acetaminophen (NORCO) 5-325 MG tablet Take 1-2 tablets by mouth every 4 (four) hours as needed  for moderate pain. 30 tablet 0  . pantoprazole (PROTONIX) 40 MG tablet Take 40 mg daily by mouth.  2  . venlafaxine XR (EFFEXOR-XR) 75 MG 24 hr capsule TAKE 1 CAPSULE BY MOUTH DAILY AT BEDTIME SINCE MAY CAUSE DROWSINESS. 30 capsule 1  . diazepam (VALIUM) 2 MG tablet TAKE 2MG  BY MOUTH DAILY AS NEEDED     No current facility-administered medications for this encounter.     Physical Findings:  height is 5\' 1"  (1.549 m) and weight is 191 lb (86.6 kg). Her oral temperature is 98.8 F (37.1 C). Her blood pressure is 133/77 and her pulse is 72. Her respiration is 20 and oxygen saturation is 98%.  Pain Assessment Pain Score: 0-No pain/10 In general this is a well appearing African American female in no acute distress. She's alert and oriented x4 and appropriate throughout the examination. Cardiopulmonary assessment is negative for acute distress and she exhibits normal effort. The right breast was examined and reveals no hyperpigmentation at this time and no desquamation.  Her mammoplasty scar is well healed.    Lab Findings: Lab Results  Component Value Date   WBC 11.5 (H) 05/27/2017   HGB 9.1 (L) 05/27/2017   HCT 28.9 (L) 05/27/2017   MCV 87.0 05/27/2017   PLT 254 05/27/2017     Radiographic Findings: No results found.  Impression/Plan: 1. Stage IA,pT2N0M0, grade 1, ER/PR positive invasive ductal carcinomaof the right breast. The patient has been doing well since completion of radiotherapy. We discussed that we would be happy to continue to follow her as needed, but she will also continue to follow up with Dr. Burr Medico in medical oncology. She was counseled on skin care as well as measures to avoid sun exposure to this area.  2. Survivorship. She will meet with survivorship clinic this summer and we reviewed the importance of this visit.  3. Vasomotor symptoms of menopause. We discussed several off label uses of anithistamines and Vitamin E and she will consider these and review with Dr.  Burr Medico.      Carola Rhine, PAC

## 2017-10-11 ENCOUNTER — Telehealth: Payer: Self-pay

## 2017-10-11 NOTE — Telephone Encounter (Signed)
Spoke with patient reminding of SCP visit with NP on 10/16/17 at 9 am.  Patient says she will come to appt.

## 2017-10-13 ENCOUNTER — Other Ambulatory Visit: Payer: Self-pay | Admitting: Hematology

## 2017-10-16 ENCOUNTER — Inpatient Hospital Stay: Payer: 59 | Attending: Hematology | Admitting: Adult Health

## 2017-10-16 ENCOUNTER — Encounter: Payer: Self-pay | Admitting: Adult Health

## 2017-10-16 VITALS — BP 131/79 | HR 92 | Temp 98.6°F | Resp 18 | Ht 61.0 in | Wt 187.3 lb

## 2017-10-16 DIAGNOSIS — N951 Menopausal and female climacteric states: Secondary | ICD-10-CM | POA: Diagnosis not present

## 2017-10-16 DIAGNOSIS — C50411 Malignant neoplasm of upper-outer quadrant of right female breast: Secondary | ICD-10-CM

## 2017-10-16 DIAGNOSIS — Z79811 Long term (current) use of aromatase inhibitors: Secondary | ICD-10-CM | POA: Diagnosis not present

## 2017-10-16 DIAGNOSIS — Z800 Family history of malignant neoplasm of digestive organs: Secondary | ICD-10-CM | POA: Insufficient documentation

## 2017-10-16 DIAGNOSIS — Z170 Estrogen receptor positive status [ER+]: Secondary | ICD-10-CM | POA: Diagnosis not present

## 2017-10-16 DIAGNOSIS — Z79899 Other long term (current) drug therapy: Secondary | ICD-10-CM | POA: Insufficient documentation

## 2017-10-16 DIAGNOSIS — E2839 Other primary ovarian failure: Secondary | ICD-10-CM

## 2017-10-16 NOTE — Patient Instructions (Signed)
Bone Health Bones protect organs, store calcium, and anchor muscles. Good health habits, such as eating nutritious foods and exercising regularly, are important for maintaining healthy bones. They can also help to prevent a condition that causes bones to lose density and become weak and brittle (osteoporosis). Why is bone mass important? Bone mass refers to the amount of bone tissue that you have. The higher your bone mass, the stronger your bones. An important step toward having healthy bones throughout life is to have strong and dense bones during childhood. A young adult who has a high bone mass is more likely to have a high bone mass later in life. Bone mass at its greatest it is called peak bone mass. A large decline in bone mass occurs in older adults. In women, it occurs about the time of menopause. During this time, it is important to practice good health habits, because if more bone is lost than what is replaced, the bones will become less healthy and more likely to break (fracture). If you find that you have a low bone mass, you may be able to prevent osteoporosis or further bone loss by changing your diet and lifestyle. How can I find out if my bone mass is low? Bone mass can be measured with an X-ray test that is called a bone mineral density (BMD) test. This test is recommended for all women who are age 65 or older. It may also be recommended for men who are age 70 or older, or for people who are more likely to develop osteoporosis due to:  Having bones that break easily.  Having a long-term disease that weakens bones, such as kidney disease or rheumatoid arthritis.  Having menopause earlier than normal.  Taking medicine that weakens bones, such as steroids, thyroid hormones, or hormone treatment for breast cancer or prostate cancer.  Smoking.  Drinking three or more alcoholic drinks each day.  What are the nutritional recommendations for healthy bones? To have healthy bones, you  need to get enough of the right minerals and vitamins. Most nutrition experts recommend getting these nutrients from the foods that you eat. Nutritional recommendations vary from person to person. Ask your health care provider what is healthy for you. Here are some general guidelines. Calcium Recommendations Calcium is the most important (essential) mineral for bone health. Most people can get enough calcium from their diet, but supplements may be recommended for people who are at risk for osteoporosis. Good sources of calcium include:  Dairy products, such as low-fat or nonfat milk, cheese, and yogurt.  Dark green leafy vegetables, such as bok choy and broccoli.  Calcium-fortified foods, such as orange juice, cereal, bread, soy beverages, and tofu products.  Nuts, such as almonds.  Follow these recommended amounts for daily calcium intake:  Children, age 1?3: 700 mg.  Children, age 4?8: 1,000 mg.  Children, age 9?13: 1,300 mg.  Teens, age 14?18: 1,300 mg.  Adults, age 19?50: 1,000 mg.  Adults, age 51?70: ? Men: 1,000 mg. ? Women: 1,200 mg.  Adults, age 71 or older: 1,200 mg.  Pregnant and breastfeeding females: ? Teens: 1,300 mg. ? Adults: 1,000 mg.  Vitamin D Recommendations Vitamin D is the most essential vitamin for bone health. It helps the body to absorb calcium. Sunlight stimulates the skin to make vitamin D, so be sure to get enough sunlight. If you live in a cold climate or you do not get outside often, your health care provider may recommend that you take vitamin   D supplements. Good sources of vitamin D in your diet include:  Egg yolks.  Saltwater fish.  Milk and cereal fortified with vitamin D.  Follow these recommended amounts for daily vitamin D intake:  Children and teens, age 1?18: 600 international units.  Adults, age 50 or younger: 400-800 international units.  Adults, age 51 or older: 800-1,000 international units.  Other Nutrients Other nutrients  for bone health include:  Phosphorus. This mineral is found in meat, poultry, dairy foods, nuts, and legumes. The recommended daily intake for adult men and adult women is 700 mg.  Magnesium. This mineral is found in seeds, nuts, dark green vegetables, and legumes. The recommended daily intake for adult men is 400?420 mg. For adult women, it is 310?320 mg.  Vitamin K. This vitamin is found in green leafy vegetables. The recommended daily intake is 120 mg for adult men and 90 mg for adult women.  What type of physical activity is best for building and maintaining healthy bones? Weight-bearing and strength-building activities are important for building and maintaining peak bone mass. Weight-bearing activities cause muscles and bones to work against gravity. Strength-building activities increases muscle strength that supports bones. Weight-bearing and muscle-building activities include:  Walking and hiking.  Jogging and running.  Dancing.  Gym exercises.  Lifting weights.  Tennis and racquetball.  Climbing stairs.  Aerobics.  Adults should get at least 30 minutes of moderate physical activity on most days. Children should get at least 60 minutes of moderate physical activity on most days. Ask your health care provide what type of exercise is best for you. Where can I find more information? For more information, check out the following websites:  National Osteoporosis Foundation: http://nof.org/learn/basics  National Institutes of Health: http://www.niams.nih.gov/Health_Info/Bone/Bone_Health/bone_health_for_life.asp  This information is not intended to replace advice given to you by your health care provider. Make sure you discuss any questions you have with your health care provider. Document Released: 08/20/2003 Document Revised: 12/18/2015 Document Reviewed: 06/04/2014 [employer] Interactive Patient Education  2018 [employer]  

## 2017-10-16 NOTE — Progress Notes (Signed)
CLINIC:  Survivorship   REASON FOR VISIT:  Routine follow-up post-treatment for a recent history of breast cancer.  BRIEF ONCOLOGIC HISTORY:  Oncology History   Cancer Staging Malignant neoplasm of upper-outer quadrant of right breast, estrogen receptor positive ([place]) Staging form: Breast, AJCC 8th Edition - Clinical stage from 04/12/2017: Stage IIA (cT3, cN0, cM0, G1, ER: Positive, PR: Positive, HER2: Negative) - Signed by Truitt Merle, MD on 04/16/2017 - Pathologic: Stage IA (pT2, pN0(sn), cM0, G1, ER: Positive, PR: Positive, HER2: Negative, Oncotype DX score: 12) - Unsigned       Malignant neoplasm of upper-outer quadrant of right breast, estrogen receptor positive ([place])   04/07/2017 Mammogram    IMPRESSION: 1. Highly suspicious mass within the upper right breast, 12 to 12:30 o'clock axis, at posterior depth, with associated architectural distortion and pleomorphic calcifications. The mass measures 1 cm greatest dimension. The mass and associated microcalcifications measure 1.4 cm. No sonographic correlate identified. Recommend stereotactic biopsy using 3D tomosynthesis guidance. 2. Additional punctate and coarse heterogeneous calcifications within the upper outer quadrant of the right breast, 11-12 o'clock axis region, at middle depth, with a suspicious segmental distribution, spanning approximately 5 cm, the most suspicious calcifications best seen on the CC magnification views. Recommend additional stereotactic biopsy for these right breast calcifications to exclude multifocal/multicentric disease and/or define extent of disease.      04/12/2017 Initial Biopsy    Diagnosis 1. 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 - SEE COMMENT. 2. Breast, right, needle core biopsy, loosely grouped calcs in more central, upper outer quadrant - DUCTAL  CARCINOMA IN SITU WITH CALCIFICATIONS. - LOBULAR NEOPLASIA (ATYPICAL LOBULAR HYPERPLASIA). - FIBROCYSTIC CHANGES WITH ADENOSIS AND CALCIFICATIONS.      04/12/2017 Receptors her2    Estrogen Receptor: 95%, POSITIVE, STRONG STAINING INTENSITY Progesterone Receptor: 95%, POSITIVE, STRONG STAINING INTENSITY Proliferation Marker Ki67: 3% HER2: NEGATIVE      04/16/2017 Initial Diagnosis    Malignant neoplasm of upper-outer quadrant of right breast, estrogen receptor positive ([place])      05/11/2017 Surgery    Right breast lumpectomy and SLN biopsy       05/11/2017 Pathology Results    Right lumpectomy showed a 2.1cm invasive ductal carcinoma, margins are negative, (+)ADH, 6 nodes are all negative       05/11/2017 Oncotype testing    RS 12, predicts 9-year distant recurrence 3% with tamoxifen or AI       05/26/2017 Surgery    Breast reduction by Dr. Iran Planas       07/06/2017 -  Radiation Therapy    Adjuvant breast radiation       08/2017 -  Anti-estrogen oral therapy    Anastrozole '1mg'$  daily       INTERVAL HISTORY:  Ms. Bunkley presents to the [facility] today for our initial meeting to review her survivorship care plan detailing her treatment course for breast cancer, as well as monitoring long-term side effects of that treatment, education regarding health maintenance, screening, and overall wellness and health promotion.     Overall, Ms. Liggins reports feeling quite well.  She is taking Anastrozole daily.  She continues to have hot flashes.  She is taking Gabapentin and Effexor.  She is considering Vitamin E and a chinese tea (she isn't sure what is in the tea).      REVIEW OF SYSTEMS:  Review of Systems  Constitutional:  Negative for appetite change, chills, fatigue, fever and unexpected weight change.  HENT:   Negative for hearing loss, lump/mass, sore throat and trouble swallowing.   Eyes: Negative for eye problems and icterus.  Respiratory: Negative for  chest tightness, cough and shortness of breath.   Cardiovascular: Negative for chest pain, leg swelling and palpitations.  Gastrointestinal: Negative for abdominal distention, abdominal pain, constipation, diarrhea, nausea and vomiting.  Endocrine: Positive for hot flashes.  Musculoskeletal: Negative for arthralgias.  Skin: Negative for itching and rash.  Neurological: Negative for dizziness, extremity weakness, headaches and numbness.  Hematological: Negative for adenopathy. Does not bruise/bleed easily.  Psychiatric/Behavioral: Negative for depression. The patient is not nervous/anxious.   Breast: Denies any new nodularity, masses, tenderness, nipple changes, or nipple discharge.      ONCOLOGY TREATMENT TEAM:  1. Surgeon:  Dr. Barry Dienes at [facility] 2. Medical Oncologist: Dr. Burr Medico  3. Radiation Oncologist: Dr. Lisbeth Renshaw 4. Plastic Surgeon: Dr. Iran Planas    PAST MEDICAL/SURGICAL HISTORY:  Past Medical History:  Diagnosis Date  . Arthritis    knee   . Breast cancer, right ([place]) 04/2017  . Complication of anesthesia    hard to wake up post-op; itching after anesthesia  . GERD (gastroesophageal reflux disease)   . History of vertigo   . Hypertension    states under control with med., has been on med. > 10 yr.  . Mitral valve prolapse   . Panic attacks   . Wears partial dentures    upper   Past Surgical History:  Procedure Laterality Date  . ANTERIOR CERVICAL DECOMP/DISCECTOMY FUSION N/A 05/24/2013   Procedure: CERVICAL FOUR-FIVE, CERVICAL FIVE-SIX, CERVICAL SIX-SEVEN ANTERIOR CERVICAL DECOMPRESSION/DISCECTOMY FUSION 3 LEVELS;  Surgeon: Faythe Ghee, MD;  Location: [place] [facility];  Service: Neurosurgery;  Laterality: N/A;  . ANTERIOR CERVICAL DECOMP/DISCECTOMY FUSION  10/27/2016   C3-4  . BREAST REDUCTION SURGERY Bilateral 05/26/2017   Procedure: LEFT BREAST MAMMARY REDUCTION  (BREAST) AND RIGHT ONCOPLASTIC RECONSTRUCTION;  Surgeon: Irene Limbo, MD;  Location:  [place];  Service: Plastics;  Laterality: Bilateral;  . CESAREAN SECTION     x 2  . DILATION AND CURETTAGE OF UTERUS  05/30/2008  . ENDOMETRIAL ABLATION  05/30/2008  . RADIOACTIVE SEED GUIDED PARTIAL MASTECTOMY WITH AXILLARY SENTINEL LYMPH NODE BIOPSY Right 05/11/2017   Procedure: RIGHT BRACKETED RADIOACTIVE SEED GUIDED PARTIAL MASTECTOMY WITH  SENTINEL LYMPH NODE BIOPSY WITH ERAS PATHWAY;  Surgeon: [name], MD;  Location: [place];  Service: General;  Laterality: Right;  PEC BLOCK  . TUBAL LIGATION       ALLERGIES:  No Known Allergies   CURRENT MEDICATIONS:  Outpatient Encounter Medications as of 10/16/2017  Medication Sig  . anastrozole (ARIMIDEX) 1 MG tablet Take 1 tablet (1 mg total) by mouth daily.  . celecoxib (CELEBREX) 200 MG capsule Take 200 mg by mouth daily.   . clonazePAM (KLONOPIN) 1 MG tablet Take 0.5 mg by mouth as needed for anxiety.  . diazepam (VALIUM) 2 MG tablet TAKE '2MG'$  BY MOUTH DAILY AS NEEDED  . fexofenadine-pseudoephedrine (ALLEGRA-D 24) 180-240 MG per 24 hr tablet Take 1 tablet by mouth daily as needed (for sinus).  . gabapentin (NEURONTIN) 600 MG tablet Take 1 tablet (600 mg total) by mouth 3 (three) times daily.  . hydrochlorothiazide (HYDRODIURIL) 25 MG tablet Take 25 mg by mouth daily.  Marland Kitchen HYDROcodone-acetaminophen (NORCO) 5-325 MG tablet Take 1-2 tablets by mouth every 4 (four) hours as needed for moderate pain.  Marland Kitchen  pantoprazole (PROTONIX) 40 MG tablet Take 40 mg daily by mouth.  . venlafaxine XR (EFFEXOR-XR) 75 MG 24 hr capsule TAKE 1 CAPSULE BY MOUTH DAILY AT BEDTIME SINCE MAY CAUSE DROWSINESS.  . [DISCONTINUED] DULoxetine (CYMBALTA) 30 MG capsule TAKE ONE CAPSULE('30mg'$ ) BY MOUTH EVERY DAY   No facility-administered encounter medications on file as of 10/16/2017.      ONCOLOGIC FAMILY HISTORY:  Family History  Problem Relation Age of Onset  . Cancer Mother 54       colon cancer       SOCIAL HISTORY:  Social  History   Socioeconomic History  . Marital status: Legally Separated    Spouse name: Not on file  . Number of children: 2  . Years of education: Not on file  . Highest education level: Not on file  Occupational History  . Not on file  Social Needs  . Financial resource strain: Not on file  . Food insecurity:    Worry: Not on file    Inability: Not on file  . Transportation needs:    Medical: Not on file    Non-medical: Not on file  Tobacco Use  . Smoking status: Never Smoker  . Smokeless tobacco: Never Used  Substance and Sexual Activity  . Alcohol use: No  . Drug use: No  . Sexual activity: Not on file  Lifestyle  . Physical activity:    Days per week: Not on file    Minutes per session: Not on file  . Stress: Not on file  Relationships  . Social connections:    Talks on phone: Not on file    Gets together: Not on file    Attends religious service: Not on file    Active member of club or organization: Not on file    Attends meetings of clubs or organizations: Not on file    Relationship status: Not on file  . Intimate partner violence:    Fear of current or ex partner: Not on file    Emotionally abused: Not on file    Physically abused: Not on file    Forced sexual activity: Not on file  Other Topics Concern  . Not on file  Social History Narrative  . Not on file     PHYSICAL EXAMINATION:  Vital Signs:   Vitals:   10/16/17 0908  BP: 131/79  Pulse: 92  Resp: 18  Temp: 98.6 F (37 C)  SpO2: 97%   Filed Weights   10/16/17 0908  Weight: 187 lb 4.8 oz (85 kg)   General: Well-nourished, well-appearing female in no acute distress.  She is unaccompanied today.   HEENT: Head is normocephalic.  Pupils equal and reactive to light. Conjunctivae clear without exudate.  Sclerae anicteric. Oral mucosa is pink, moist.  Oropharynx is pink without lesions or erythema.  Lymph: No cervical, supraclavicular, or infraclavicular lymphadenopathy noted on palpation.    Cardiovascular: Regular rate and rhythm.Marland Kitchen Respiratory: Clear to auscultation bilaterally. Chest expansion symmetric; breathing non-labored.  GI: Abdomen soft and round; non-tender, non-distended. Bowel sounds normoactive.  GU: Deferred.  Neuro: No focal deficits. Steady gait.  Psych: Mood and affect normal and appropriate for situation.  Extremities: No edema. MSK: No focal spinal tenderness to palpation.  Full range of motion in bilateral upper extremities Skin: Warm and dry.  LABORATORY DATA:  None for this visit.  DIAGNOSTIC IMAGING:  None for this visit.   ASSESSMENT AND PLAN:  Ms.. Caratachea is a pleasant 57 y.o. female with  Stage IA right breast invasive ductal carcinoma, ER+/PR+/HER2-, diagnosed in 03/2017, treated with lumpectomy, adjuvant radiation therapy, and anti-estrogen therapy with Anastrozole beginning in 08/2017.  She presents to the Survivorship Clinic for our initial meeting and routine follow-up post-completion of treatment for breast cancer.    1. Stage IA right breast cancer:  Ms. Arens is continuing to recover from definitive treatment for breast cancer. She will follow-up with her medical oncologist, Dr. Burr Medico in 01/2018 with history and physical exam per surveillance protocol.  She will continue her anti-estrogen therapy with  Anastrozole. Thus far, she is tolerating the Anastrozole well, with minimal side effects. Today, a comprehensive survivorship care plan and treatment summary was reviewed with the patient today detailing her breast cancer diagnosis, treatment course, potential late/long-term effects of treatment, appropriate follow-up care with recommendations for the future, and patient education resources.  A copy of this summary, along with a letter will be sent to the patient's primary care provider via mail/fax/In Basket message after today's visit.    2. Hot flashes: We reviewed these today.  She is already taking Effexor and Gabapentin.  She is planning  on trying Vitamin E.  I let her know about the [facility] website that has information about herbal supplements.  We also discussed acupuncture.    3. Bone health:  Given Ms. Ebert's age/history of breast cancer and her current treatment regimen including anti-estrogen therapy with Anastrozole, she is at risk for bone demineralization.  She has not undergone DEXA, and I ordered this for her today to be done when she undergoes her mammogram.  In the meantime, she was encouraged to increase her consumption of foods rich in calcium, as well as increase her weight-bearing activities.  She was given education on specific activities to promote bone health.  4. Cancer screening:  Due to Ms. Gomm's history and her age, she should receive screening for skin cancers, colon cancer, and gynecologic cancers.  The information and recommendations are listed on the patient's comprehensive care plan/treatment summary and were reviewed in detail with the patient.    5. Health maintenance and wellness promotion: Ms. Gift was encouraged to consume 5-7 servings of fruits and vegetables per day. We reviewed the "Nutrition Rainbow" handout, as well as the handout "Take Control of Your Health and Reduce Your Cancer Risk" from the [place].  She was also encouraged to engage in moderate to vigorous exercise for 30 minutes per day most days of the week. We discussed the [employer] fitness program, which is designed for cancer survivors to help them become more physically fit after cancer treatments.  She was instructed to limit her alcohol consumption and continue to abstain from tobacco use.     6. Support services/counseling: It is not uncommon for this period of the patient's cancer care trajectory to be one of many emotions and stressors.  We discussed an opportunity for her to participate in the next session of [facility] ("Finding Your New Normal") support group series designed for patients after they have  completed treatment.   Ms. Zern was encouraged to take advantage of our many other support services programs, support groups, and/or counseling in coping with her new life as a cancer survivor after completing anti-cancer treatment.  She was offered support today through active listening and expressive supportive counseling.  She was given information regarding our available services and encouraged to contact me with any questions or for help enrolling in any of our support group/programs.  Dispo:   -Return to cancer center for follow up with Dr. Burr Medico in 01/2018  -Mammogram due in 03/2018 -Bone Density in 03/2018 -Follow up with Dr. Barry Dienes at [facility] 07/2018 -She is welcome to return back to the Survivorship Clinic at any time; no additional follow-up needed at this time.  -Consider referral back to survivorship as a long-term survivor for continued surveillance  A total of (30) minutes of face-to-face time was spent with this patient with greater than 50% of that time in counseling and care-coordination.   Gardenia Phlegm, [place] [phone]   Note: PRIMARY CARE PROVIDER Orpah Melter, [place] [phone]

## 2017-10-17 ENCOUNTER — Telehealth: Payer: Self-pay | Admitting: Adult Health

## 2017-10-17 NOTE — Telephone Encounter (Signed)
Per 5/6 no los °

## 2017-10-27 DIAGNOSIS — M62838 Other muscle spasm: Secondary | ICD-10-CM | POA: Diagnosis not present

## 2017-10-27 DIAGNOSIS — H6593 Unspecified nonsuppurative otitis media, bilateral: Secondary | ICD-10-CM | POA: Diagnosis not present

## 2017-11-01 DIAGNOSIS — Z9889 Other specified postprocedural states: Secondary | ICD-10-CM | POA: Diagnosis not present

## 2017-11-01 DIAGNOSIS — Z853 Personal history of malignant neoplasm of breast: Secondary | ICD-10-CM | POA: Diagnosis not present

## 2017-11-09 DIAGNOSIS — R252 Cramp and spasm: Secondary | ICD-10-CM | POA: Diagnosis not present

## 2017-11-10 DIAGNOSIS — R252 Cramp and spasm: Secondary | ICD-10-CM | POA: Diagnosis not present

## 2017-11-28 ENCOUNTER — Other Ambulatory Visit: Payer: Self-pay | Admitting: Hematology

## 2017-11-28 DIAGNOSIS — C50411 Malignant neoplasm of upper-outer quadrant of right female breast: Secondary | ICD-10-CM

## 2017-11-28 DIAGNOSIS — Z170 Estrogen receptor positive status [ER+]: Principal | ICD-10-CM

## 2017-12-27 ENCOUNTER — Other Ambulatory Visit: Payer: Self-pay | Admitting: Hematology

## 2017-12-27 DIAGNOSIS — Z170 Estrogen receptor positive status [ER+]: Principal | ICD-10-CM

## 2017-12-27 DIAGNOSIS — C50411 Malignant neoplasm of upper-outer quadrant of right female breast: Secondary | ICD-10-CM

## 2018-01-11 NOTE — Progress Notes (Signed)
[place]  [phone] [phone]  Clinic Follow up Note   Patient Care Team: Orpah Melter, MD as PCP - General (Family Medicine) Truitt Merle, MD as Consulting Physician (Hematology) [name], MD as Consulting Physician (General Surgery) Kyung Rudd, MD as Consulting Physician (Radiation Oncology) Irene Limbo, MD as Consulting Physician (Plastic Surgery) Gardenia Phlegm, NP as Nurse Practitioner (Hematology and Oncology) 01/15/2018  CHIEF COMPLAINTS:  Follow up right breast cancer, ER/PR +, HER2 -  Oncology History   Cancer Staging Malignant neoplasm of upper-outer quadrant of right breast, estrogen receptor positive (Felicia Bauer) Staging form: Breast, AJCC 8th Edition - Clinical stage from 04/12/2017: Stage IIA (cT3, cN0, cM0, G1, ER: Positive, PR: Positive, HER2: Negative) - Signed by Truitt Merle, MD on 04/16/2017 - Pathologic: Stage IA (pT2, pN0(sn), cM0, G1, ER: Positive, PR: Positive, HER2: Negative, Oncotype DX score: 12) - Unsigned       Malignant neoplasm of upper-outer quadrant of right breast, estrogen receptor positive (Felicia Bauer)   04/07/2017 Mammogram    IMPRESSION: 1. Highly suspicious mass within the upper right breast, 12 to 12:30 o'clock axis, at posterior depth, with associated architectural distortion and pleomorphic calcifications. The mass measures 1 cm greatest dimension. The mass and associated microcalcifications measure 1.4 cm. No sonographic correlate identified. Recommend stereotactic biopsy using 3D tomosynthesis guidance. 2. Additional punctate and coarse heterogeneous calcifications within the upper outer quadrant of the right breast, 11-12 o'clock axis region, at middle depth, with a suspicious segmental distribution, spanning approximately 5 cm, the most suspicious calcifications best seen on the CC magnification views. Recommend additional stereotactic biopsy for these right breast calcifications to  exclude multifocal/multicentric disease and/or define extent of disease.      04/12/2017 Initial Biopsy    Diagnosis 1. 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 - SEE COMMENT. 2. Breast, right, needle core biopsy, loosely grouped calcs in more central, upper outer quadrant - DUCTAL CARCINOMA IN SITU WITH CALCIFICATIONS. - LOBULAR NEOPLASIA (ATYPICAL LOBULAR HYPERPLASIA). - FIBROCYSTIC CHANGES WITH ADENOSIS AND CALCIFICATIONS.      04/12/2017 Receptors her2    Estrogen Receptor: 95%, POSITIVE, STRONG STAINING INTENSITY Progesterone Receptor: 95%, POSITIVE, STRONG STAINING INTENSITY Proliferation Marker Ki67: 3% HER2: NEGATIVE      04/16/2017 Initial Diagnosis    Malignant neoplasm of upper-outer quadrant of right breast, estrogen receptor positive (Felicia Bauer)      04/21/2017 Mammogram    04/21/2017 Mammogram IMPRESSION: 1. 2.8 x 2.0 x 0.5 cm biopsy-proven invasive ductal carcinoma and ductal carcinoma in situ in the posterior aspect of the upper-outer quadrant of the right breast. 2. No mass or abnormal enhancement at the location of the recently biopsied low-grade ductal carcinoma in situ with calcifications and atypical lobular hyperplasia in the central aspect of the upper-outer quadrant of the right breast. 3. No evidence of malignancy on the left.      05/11/2017 Surgery    Right breast lumpectomy and SLN biopsy       05/11/2017 Pathology Results    Right lumpectomy showed a 2.1cm invasive ductal carcinoma, margins are negative, (+)ADH, 6 nodes are all negative       05/11/2017 Oncotype testing    RS 12, predicts 9-year distant recurrence 3% with tamoxifen or AI       05/26/2017 Surgery    Breast reduction by Dr. Iran Planas       05/26/2017 Pathology Results  Diagnosis 1. Breast, Mammoplasty, left - FIBROCYSTIC CHANGES WITH ADENOSIS  AND CALCIFICATIONS. - THERE IS NO EVIDENCE OF MALIGNANCY. 2. Breast, Mammoplasty, right - FIBROCYSTIC CHANGES WITH ADENOSIS AND CALCIFICATIONS. - FAT NECROSIS. - THERE IS NO EVIDENCE OF MALIGNANCY.      07/06/2017 - 08/18/2017 Radiation Therapy    Adjuvant breast radiation with Dr. Lisbeth Renshaw      08/2017 -  Anti-estrogen oral therapy    Anastrozole 67m daily        HISTORY OF PRESENTING ILLNESS: 04/19/17 [name]57y.o. female is here because of newly diagnosed right breast cancer. She presents to Breast Clinic today by herself and arrived with her son.   In the past she had spinal fusion twice in her cervical spine. She takes gabapentin and Cymbalta for her pain. She had tubal ligation, cesarean surgery and cervical ablations. She does have mitral valve prolapse. Her mother had colon cancer, diagnosed at 753yo.   Today she reports her lump was found by screening mammogram which she gets yearly. She notes she has recent swelling in her legs, R>L. She denies any other recent change, SOB, or new pain. 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 She is seperated and has one son and one daughter. She still has periods.   CURRENT THERAPY: Adjuvant Anastrazole 55mdaily.  INTERIM HISTORY:  Ms. WiBeaverseturns for follow-up. Her last radiation with Dr. MoLisbeth Renshawas on 08/18/2017. She is here today with a family member. She brings a fan with her today and is feeling hot. She complains of very bad hot flashes, especially at night, when she wakes up frequently due to discomfort. She takes Venlafaxine 75 mg for that. She takes gabapentin after spinal surgery.  She is compliant with Anastrozole. She reports having finger cramps and stiffness.  She reports having bilateral LL edema at the end of the day. Her job requires long hours of sitting.   MEDICAL HISTORY:  Past Medical History:  Diagnosis Date  . Arthritis    knee   . [facility]  . Complication of anesthesia     hard to wake up post-op; itching after anesthesia  . GERD (gastroesophageal reflux disease)   . History of vertigo   . Hypertension    states under control with med., has been on med. > 10 yr.  . Mitral valve prolapse   . Panic attacks   . Wears partial dentures    upper    SURGICAL HISTORY: Past Surgical History:  Procedure Laterality Date  . ANTERIOR CERVICAL DECOMP/DISCECTOMY FUSION N/A 05/24/2013   Procedure: CERVICAL FOUR-FIVE, CERVICAL FIVE-SIX, CERVICAL SIX-SEVEN ANTERIOR CERVICAL DECOMPRESSION/DISCECTOMY FUSION 3 LEVELS;  Surgeon: RaFaythe GheeMD;  [facility];  Service: Neurosurgery;  Laterality: N/A;  . ANTERIOR CERVICAL DECOMP/DISCECTOMY FUSION  10/27/2016   C3-4  . BREAST REDUCTION SURGERY Bilateral 05/26/2017   Procedure: LEFT BREAST MAMMARY REDUCTION  (BREAST) AND RIGHT ONCOPLASTIC RECONSTRUCTION;  Surgeon: ThIrene LimboMD;  [facility] Service: [facility];  Laterality: Bilateral;  . CESAREAN SECTION     x 2  . DILATION AND CURETTAGE OF UTERUS  05/30/2008  . ENDOMETRIAL ABLATION  05/30/2008  . RADIOACTIVE SEED GUIDED PARTIAL MASTECTOMY WITH AXILLARY SENTINEL LYMPH NODE BIOPSY Right 05/11/2017   Procedure: RIGHT BRACKETED RADIOACTIVE SEED GUIDED PARTIAL MASTECTOMY WITH  SENTINEL LYMPH NODE BIOPSY WITH ERAS PATHWAY;  Surgeon: ByStark KleinMD;  [facility] Service: General;  Laterality: Right;  PEC BLOCK  . TUBAL LIGATION      SOCIAL HISTORY: Social History   Socioeconomic History  . Marital status: Legally Separated    Spouse name: Not on file  . Number of children: 2  . Years of education: Not on file  . Highest education level: Not on file  Occupational History  . Not on file  Social Needs  . Financial resource strain: Not on file  . Food insecurity:    Worry: Not on file    Inability: Not on file  . Transportation needs:    Medical: Not on file    Non-medical: Not on file  Tobacco  Use  . Smoking status: Never Smoker  . Smokeless tobacco: Never Used  Substance and Sexual Activity  . Alcohol use: No  . Drug use: No  . Sexual activity: Not on file  Lifestyle  . Physical activity:    Days per week: Not on file    Minutes per session: Not on file  . Stress: Not on file  Relationships  . Social connections:    Talks on phone: Not on file    Gets together: Not on file    Attends religious service: Not on file    Active member of club or organization: Not on file    Attends meetings of clubs or organizations: Not on file    Relationship status: Not on file  . Intimate partner violence:    Fear of current or ex partner: Not on file    Emotionally abused: Not on file    Physically abused: Not on file    Forced sexual activity: Not on file  Other Topics Concern  . Not on file  Social History Narrative  . Not on file    FAMILY HISTORY: Family History  Problem Relation Age of Onset  . Cancer Mother 59       colon cancer     ALLERGIES:  has No Known Allergies.  MEDICATIONS:  Current Outpatient Medications  Medication Sig Dispense Refill  . anastrozole (ARIMIDEX) 1 MG tablet Take 1 tablet (1 mg total) by mouth daily. 30 tablet 5  . celecoxib (CELEBREX) 200 MG capsule Take 200 mg by mouth daily.     . clonazePAM (KLONOPIN) 1 MG tablet Take 0.5 mg by mouth as needed for anxiety.    . diazepam (VALIUM) 2 MG tablet TAKE 2MG BY MOUTH DAILY AS NEEDED    . fexofenadine-pseudoephedrine (ALLEGRA-D 24) 180-240 MG per 24 hr tablet Take 1 tablet by mouth daily as needed (for sinus).    . gabapentin (NEURONTIN) 600 MG tablet Take 1 tablet (600 mg total) by mouth 3 (three) times daily. 100 tablet 2  . hydrochlorothiazide (HYDRODIURIL) 25 MG tablet Take 25 mg by mouth daily.    Marland Kitchen HYDROcodone-acetaminophen (NORCO) 5-325 MG tablet Take 1-2 tablets by mouth every 4 (four) hours as needed for moderate pain. 30 tablet 0  . pantoprazole (PROTONIX) 40 MG tablet Take 40 mg daily  by mouth.  2  . venlafaxine XR (EFFEXOR XR) 150 MG 24 hr capsule Take 1 capsule (150 mg total) by mouth daily with breakfast. 30 capsule 4   No current facility-administered medications for this visit.     REVIEW OF SYSTEMS:  Constitutional: Denies fevers, chills or abnormal night sweats (+) very bad hot flashes Eyes: Denies blurriness of vision, double vision or watery eyes Ears, nose, mouth, throat, and face: Denies mucositis or sore throat Respiratory: Denies cough, dyspnea or wheezes  Cardiovascular: Denies palpitation, chest discomfort (+) bilateral LL edema at the end of the day Gastrointestinal:  Denies nausea, heartburn or change in bowel habits Skin: Denies abnormal skin rashes Lymphatics: Denies new lymphadenopathy or easy bruising MSK: (+) bilateral hand joint stiffness and cramps Neurological:Denies numbness, tingling or new weaknesses Behavioral/Psych: Mood is stable, no new changes  All other systems were reviewed with the patient and are negative.  PHYSICAL EXAMINATION: ECOG PERFORMANCE STATUS: 0  Vitals:   01/15/18 1010  BP: 117/61  Pulse: 72  Resp: 18  Temp: 98.7 F (37.1 C)  SpO2: (!) 10%   Filed Weights   01/15/18 1010  Weight: 191 lb 9.6 oz (86.9 kg)    GENERAL:alert, no distress and comfortable (+0 using a fan to relief her hot flashes  SKIN: skin color, texture, turgor are normal, no rashes or significant lesions EYES: normal, conjunctiva are pink and non-injected, sclera clear OROPHARYNX:no exudate, no erythema and lips, buccal mucosa, and tongue normal  NECK: supple, thyroid normal size, non-tender, without nodularity LYMPH:  no palpable lymphadenopathy in the cervical, axillary or inguinal LUNGS: clear to auscultation and percussion with normal breathing effort HEART: regular rate & rhythm and no murmurs and no lower extremity edema ABDOMEN:abdomen soft, non-tender and normal bowel sounds Musculoskeletal:no cyanosis of digits and no clubbing    PSYCH: alert & oriented x 3 with fluent speech NEURO: no focal motor/sensory deficits BREAST: Breast inspection showed them to be symmetrical with no nipple discharge.  Surgical scar in both right breasts have healed very well.  Diffuse skin hyperpigmentation of the right breast from radiation, no skin peeling or ulcers.  LABORATORY DATA:  I have reviewed the data as listed CBC Latest Ref Rng & Units 01/15/2018 05/27/2017 05/26/2017  WBC 3.9 - 10.3 K/uL 5.8 11.5(H) -  Hemoglobin 11.6 - 15.9 g/dL 12.0 9.1(L) 10.6(L)  Hematocrit 34.8 - 46.6 % 37.7 28.9(L) -  Platelets 145 - 400 K/uL 208 254 -    CMP Latest Ref Rng & Units 01/15/2018 05/19/2017 04/19/2017  Glucose 70 - 99 mg/dL 97 84 95  BUN 6 - 20 mg/dL 18 12 17.2  Creatinine 0.44 - 1.00 mg/dL 0.77 0.63 0.8  Sodium 135 - 145 mmol/L 140 137 142  Potassium 3.5 - 5.1 mmol/L 4.1 3.7 3.7  Chloride 98 - 111 mmol/L 103 102 -  CO2 22 - 32 mmol/L _0 Calcium 8.9 - 10.3 mg/dL 9.8 9.1 9.8  Total Protein 6.5 - 8.1 g/dL 7.3 - 7.6  Total Bilirubin 0.3 - 1.2 mg/dL 0.4 - 0.47  Alkaline Phos 38 - 126 U/L 66 - 58  AST 15 - 41 U/L 19 - 17  ALT 0 - 44 U/L 21 - 12    PATHOLOGY  Diagnosis 04/12/17 1. Breast, right, needle core biopsy, lateral aspect of upper outer quadrant, mass with calcs - INVASIVE DUCTAL CARCINOMA. - DUCTAL CARCINOMA IN SITU WITH CALCIFICATIONS. - SEE COMMENT. 2. Breast, right, needle core biopsy, loosely grouped calcs in more central, upper outer quadrant - DUCTAL CARCINOMA IN SITU WITH CALCIFICATIONS. - LOBULAR NEOPLASIA (ATYPICAL LOBULAR HYPERPLASIA). - FIBROCYSTIC CHANGES WITH ADENOSIS AND CALCIFICATIONS. - SEE COMMENT. 1 of 3 FINAL for TEFFANY, BLASZCZYK ([id number]) Microscopic Comment 1. The carcinoma appears grade I. A breast prognostic profile will be performed and the results reported separately. The results were called to the [facility] on 04/13/17. 2. The carcinoma is low grade. Estrogen  receptor and progesterone receptor studies will be performed and  the results reported separately. The results were called to the [facility] on 04/13/17. (JBK:kh 04/13/17) 1. FLUORESCENCE IN-SITU HYBRIDIZATION Results: HER2 - NEGATIVE RATIO OF HER2/CEP17 SIGNALS 1.28 AVERAGE HER2 COPY NUMBER PER CELL 2.50 Results: IMMUNOHISTOCHEMICAL AND MORPHOMETRIC ANALYSIS PERFORMED MANUALLY Estrogen Receptor: 95%, POSITIVE, STRONG STAINING INTENSITY Progesterone Receptor: 95%, POSITIVE, STRONG STAINING INTENSITY Proliferation Marker Ki67: 3%  Diagnosis 05/11/2017 1. Breast, lumpectomy, Far Right Lateral Mass - INVASIVE DUCTAL CARCINOMA, GRADE 1, SPANNING 2.1 CM. - ATYPICAL DUCTAL HYPERPLASIA. - INVASIVE CARCINOMA COMES TO WITHIN 0.1 CM OF THE ANTERIOR MARGIN FOCALLY. - BIOPSY SITE. - SEE ONCOLOGY TABLE. 2. Breast, lumpectomy, Right Upper Outer Quadrant Medial - FIBROCYSTIC CHANGE WITH CALCIFICATIONS. - BIOPSY SITE. - NO RESIDUAL CARCINOMA IDENTIFIED. 3. Lymph node, sentinel, biopsy, Right Axillary #1 - ONE OF ONE LYMPH NODES NEGATIVE FOR CARCINOMA (0/1). 4. Lymph node, sentinel, biopsy, Right Axillary #2 - ONE OF ONE LYMPH NODES NEGATIVE FOR CARCINOMA (0/1). 5. Lymph node, sentinel, biopsy, Right Axillary #3 - ONE OF ONE LYMPH NODES NEGATIVE FOR CARCINOMA (0/1). 6. Lymph node, sentinel, biopsy, Right Axillary #4 - ONE OF ONE LYMPH NODES NEGATIVE FOR CARCINOMA (0/1). 7. Lymph node, sentinel, biopsy, Right Axillary #5 - ONE OF ONE LYMPH NODES NEGATIVE FOR CARCINOMA (0/1). 8. Lymph node, sentinel, biopsy, Right Axillary #6 - ONE OF ONE LYMPH NODES NEGATIVE FOR CARCINOMA (0/1). Microscopic Comment 1. BREAST, INVASIVE TUMOR Procedure: Right breast lumpectomy (far lateral) and right axillary sentinel lymph node biopsies. Laterality: Right. Tumor Size: 2.1 cm. Histologic Type: Invasive ductal carcinoma. Grade: 1 1 of 4 FINAL for TAMECIA, MCDOUGALD ([id number]) Microscopic  Comment(continued) Tubular Differentiation: 1 Nuclear Pleomorphism: 2 Mitotic Count: 1 Ductal Carcinoma in Situ (DCIS): No residual DCIS. Extent of Tumor: Confined to breast parenchyma. Margins: Invasive carcinoma, distance from closest margin: 0.1 cm to anterior focally. DCIS, distance from closest margin: N/A. Regional Lymph Nodes: Number of Lymph Nodes Examined: 6 Number of Sentinel Lymph Nodes Examined: 6 Lymph Nodes with Macrometastases: 0 Lymph Nodes with Micrometastases: 0 Lymph Nodes with Isolated Tumor Cells: 0 Breast Prognostic Profile: Performed on biopsy [id number], see below. Will not be repeated. Estrogen Receptor: Positive, 95% strong. Progesterone Receptor: Positive, 95% strong. Her2: Negative (ratio 1.28). Ki-67: 3% Best tumor block for sendout testing: 1A Pathologic Stage Classification (pTNM, AJCC 8th Edition): Primary Tumor (pT): pT2 Regional Lymph Nodes (pN): pN0 Distant Metastases (pM): pMX Comments: None.   RADIOGRAPHIC STUDIES: I have personally reviewed the radiological images as listed and agreed with the findings in the report. No results found.  ASSESSMENT & PLAN:  Felicia Bauer is a 57 y.o. premenopausal African-American female with a history of HTN an d mitral valve disorder.    1. Malignant neoplasm of upper-outer quadrant of right breast, invasive ductal carcinoma, stage IA, pT2N0M0, ER+/PR +, HER2 -, Grade I, and DCIS ER+/PR+, RS 12 --I discussed her surgical path result in details, she had completed surgical resection with negative margins and the notes. -the Oncotype Dx result was reviewed with her in details. She has low risk based on the recurrence score, which predicts 9 year distant recurrence after 5 years of tamoxifen 3%.  No benefit of adjuvant chemotherapy.  -Giving the strong ER and PR expression in her tumor I recommend adjuvant endocrine therapy with anastrozole 1 mg daily. -She finished radiation with dr. Lisbeth Renshaw on 08/18/2017  and is currently on 36m anastrozole daily since 08/2017. -She has had hot flash lately since she came off the over-the-counter medicine for her  hot flash when she was diagnosed with cancer.  I have started her on Effexor 75 mg daily. -Her hot flashes are severe, interfering her sleep. I therefore, I recommend her to increase dose of venlafaxine to 150 mg daily  -She also complains of bilateral hand joint stiffness and cramps, for which I recommend exercise. -Mammogram at [facility] in 2 months  Lab and f/u in 3 months   2.  Hypertension, GERD, anxiety -Follow-up with primary care physician  3.  Hot flash -She was on over-the-counter medication for her hot flash, which she stopped when she was diagnosed with cancer.  Her health rash has gotten worse -I started her on Effexor in February 2019, increased to 150 mg daily due to persistent severe hot flash. -She is also on Neurontin for her back pain, which helps her hot flash also  4. Bone health -pt has not had bone density scan  -will schedule the same day of her mammogram in 2 months   PLAN:  -I increased Effexor to 150 mg daily - I refilled Anastrozole today, she will continue  -Mammogram at [facility] in 2 months  -Lab and f/u in 3 months    All questions were answered. The patient knows to call the clinic with any problems, questions or concerns. I spent 20 minutes counseling the patient face to face. The total time spent in the appointment was 25 minutes and more than 50% was on counseling.  Dierdre Searles Dweik am acting as scribe for Dr. Truitt Merle.  I have reviewed the above documentation for accuracy and completeness, and I agree with the above.     Truitt Merle, MD 01/15/2018 2:17 PM

## 2018-01-14 ENCOUNTER — Other Ambulatory Visit: Payer: Self-pay | Admitting: Hematology

## 2018-01-15 ENCOUNTER — Inpatient Hospital Stay: Payer: 59

## 2018-01-15 ENCOUNTER — Telehealth: Payer: Self-pay

## 2018-01-15 ENCOUNTER — Encounter: Payer: Self-pay | Admitting: Hematology

## 2018-01-15 ENCOUNTER — Inpatient Hospital Stay: Payer: 59 | Attending: Hematology | Admitting: Hematology

## 2018-01-15 VITALS — BP 117/61 | HR 72 | Temp 98.7°F | Resp 18 | Ht 61.0 in | Wt 191.6 lb

## 2018-01-15 DIAGNOSIS — Z86000 Personal history of in-situ neoplasm of breast: Secondary | ICD-10-CM | POA: Diagnosis not present

## 2018-01-15 DIAGNOSIS — I10 Essential (primary) hypertension: Secondary | ICD-10-CM | POA: Diagnosis not present

## 2018-01-15 DIAGNOSIS — Z170 Estrogen receptor positive status [ER+]: Secondary | ICD-10-CM | POA: Diagnosis not present

## 2018-01-15 DIAGNOSIS — C50411 Malignant neoplasm of upper-outer quadrant of right female breast: Secondary | ICD-10-CM

## 2018-01-15 DIAGNOSIS — N951 Menopausal and female climacteric states: Secondary | ICD-10-CM | POA: Insufficient documentation

## 2018-01-15 DIAGNOSIS — Z79811 Long term (current) use of aromatase inhibitors: Secondary | ICD-10-CM | POA: Diagnosis not present

## 2018-01-15 DIAGNOSIS — Z79899 Other long term (current) drug therapy: Secondary | ICD-10-CM | POA: Insufficient documentation

## 2018-01-15 DIAGNOSIS — M25642 Stiffness of left hand, not elsewhere classified: Secondary | ICD-10-CM | POA: Diagnosis not present

## 2018-01-15 DIAGNOSIS — Z923 Personal history of irradiation: Secondary | ICD-10-CM | POA: Diagnosis not present

## 2018-01-15 DIAGNOSIS — M25641 Stiffness of right hand, not elsewhere classified: Secondary | ICD-10-CM | POA: Insufficient documentation

## 2018-01-15 LAB — CMP (CANCER CENTER ONLY)
ALT: 21 U/L (ref 0–44)
AST: 19 U/L (ref 15–41)
Albumin: 4 g/dL (ref 3.5–5.0)
Alkaline Phosphatase: 66 U/L (ref 38–126)
Anion gap: 11 (ref 5–15)
BILIRUBIN TOTAL: 0.4 mg/dL (ref 0.3–1.2)
BUN: 18 mg/dL (ref 6–20)
CHLORIDE: 103 mmol/L (ref 98–111)
CO2: 26 mmol/L (ref 22–32)
CREATININE: 0.77 mg/dL (ref 0.44–1.00)
Calcium: 9.8 mg/dL (ref 8.9–10.3)
Glucose, Bld: 97 mg/dL (ref 70–99)
POTASSIUM: 4.1 mmol/L (ref 3.5–5.1)
Sodium: 140 mmol/L (ref 135–145)
TOTAL PROTEIN: 7.3 g/dL (ref 6.5–8.1)

## 2018-01-15 LAB — CBC WITH DIFFERENTIAL (CANCER CENTER ONLY)
Basophils Absolute: 0 10*3/uL (ref 0.0–0.1)
Basophils Relative: 0 %
EOS PCT: 2 %
Eosinophils Absolute: 0.1 10*3/uL (ref 0.0–0.5)
HCT: 37.7 % (ref 34.8–46.6)
Hemoglobin: 12 g/dL (ref 11.6–15.9)
LYMPHS ABS: 1.6 10*3/uL (ref 0.9–3.3)
LYMPHS PCT: 28 %
MCH: 26.8 pg (ref 25.1–34.0)
MCHC: 31.8 g/dL (ref 31.5–36.0)
MCV: 84.2 fL (ref 79.5–101.0)
MONO ABS: 0.5 10*3/uL (ref 0.1–0.9)
MONOS PCT: 8 %
Neutro Abs: 3.5 10*3/uL (ref 1.5–6.5)
Neutrophils Relative %: 62 %
PLATELETS: 208 10*3/uL (ref 145–400)
RBC: 4.48 MIL/uL (ref 3.70–5.45)
RDW: 14.3 % (ref 11.2–14.5)
WBC Count: 5.8 10*3/uL (ref 3.9–10.3)

## 2018-01-15 MED ORDER — ANASTROZOLE 1 MG PO TABS: 1.0000 mg | ORAL_TABLET | Freq: Every day | ORAL | 5 refills | Status: DC

## 2018-01-15 MED ORDER — VENLAFAXINE HCL ER 150 MG PO CP24: 150.0000 mg | ORAL_CAPSULE | Freq: Every day | ORAL | 4 refills | Status: DC

## 2018-01-15 NOTE — Telephone Encounter (Signed)
Printed avs and calender of upcoming appointment. Per 8/5 los 

## 2018-01-23 DIAGNOSIS — Z981 Arthrodesis status: Secondary | ICD-10-CM | POA: Diagnosis not present

## 2018-01-23 DIAGNOSIS — G8928 Other chronic postprocedural pain: Secondary | ICD-10-CM | POA: Diagnosis not present

## 2018-01-23 DIAGNOSIS — M4712 Other spondylosis with myelopathy, cervical region: Secondary | ICD-10-CM | POA: Diagnosis not present

## 2018-01-24 ENCOUNTER — Telehealth: Payer: Self-pay | Admitting: Hematology

## 2018-01-24 NOTE — Telephone Encounter (Signed)
Juliann Pulse at [facility] will call patient and schedule DEXA scan for same day as MM per 8/5 sch msg

## 2018-02-07 ENCOUNTER — Other Ambulatory Visit: Payer: Self-pay | Admitting: Hematology

## 2018-03-02 DIAGNOSIS — M25562 Pain in left knee: Secondary | ICD-10-CM | POA: Diagnosis not present

## 2018-03-02 DIAGNOSIS — M25561 Pain in right knee: Secondary | ICD-10-CM | POA: Diagnosis not present

## 2018-03-02 DIAGNOSIS — M1711 Unilateral primary osteoarthritis, right knee: Secondary | ICD-10-CM | POA: Diagnosis not present

## 2018-03-08 DIAGNOSIS — M25562 Pain in left knee: Secondary | ICD-10-CM | POA: Diagnosis not present

## 2018-03-13 HISTORY — PX: BREAST BIOPSY: SHX20

## 2018-04-02 ENCOUNTER — Other Ambulatory Visit: Payer: Self-pay | Admitting: Hematology

## 2018-04-02 ENCOUNTER — Ambulatory Visit
Admission: RE | Admit: 2018-04-02 | Discharge: 2018-04-02 | Disposition: A | Discharge status: Home / Self Care | Payer: 59 | Source: Ambulatory Visit | Attending: Adult Health | Admitting: Adult Health

## 2018-04-02 DIAGNOSIS — R921 Mammographic calcification found on diagnostic imaging of breast: Secondary | ICD-10-CM

## 2018-04-02 DIAGNOSIS — Z170 Estrogen receptor positive status [ER+]: Principal | ICD-10-CM

## 2018-04-02 DIAGNOSIS — R922 Inconclusive mammogram: Secondary | ICD-10-CM | POA: Diagnosis not present

## 2018-04-02 DIAGNOSIS — D0511 Intraductal carcinoma in situ of right breast: Secondary | ICD-10-CM | POA: Diagnosis not present

## 2018-04-02 DIAGNOSIS — C50411 Malignant neoplasm of upper-outer quadrant of right female breast: Secondary | ICD-10-CM

## 2018-04-02 HISTORY — DX: Personal history of irradiation: Z92.3

## 2018-04-05 ENCOUNTER — Ambulatory Visit
Admission: RE | Admit: 2018-04-05 | Discharge: 2018-04-05 | Disposition: A | Discharge status: Home / Self Care | Payer: 59 | Source: Ambulatory Visit | Attending: Hematology | Admitting: Hematology

## 2018-04-05 ENCOUNTER — Other Ambulatory Visit: Payer: Self-pay | Admitting: Hematology

## 2018-04-05 DIAGNOSIS — N641 Fat necrosis of breast: Secondary | ICD-10-CM | POA: Diagnosis not present

## 2018-04-05 DIAGNOSIS — R921 Mammographic calcification found on diagnostic imaging of breast: Secondary | ICD-10-CM

## 2018-04-05 DIAGNOSIS — N6011 Diffuse cystic mastopathy of right breast: Secondary | ICD-10-CM | POA: Diagnosis not present

## 2018-04-05 DIAGNOSIS — N6012 Diffuse cystic mastopathy of left breast: Secondary | ICD-10-CM | POA: Diagnosis not present

## 2018-04-16 ENCOUNTER — Inpatient Hospital Stay: Payer: 59 | Attending: Hematology

## 2018-04-16 ENCOUNTER — Telehealth: Payer: Self-pay

## 2018-04-16 ENCOUNTER — Encounter: Payer: Self-pay | Admitting: Hematology

## 2018-04-16 ENCOUNTER — Inpatient Hospital Stay (HOSPITAL_BASED_OUTPATIENT_CLINIC_OR_DEPARTMENT_OTHER): Payer: 59 | Admitting: Hematology

## 2018-04-16 VITALS — BP 139/85 | HR 78 | Temp 98.7°F | Resp 22 | Ht 61.0 in | Wt 194.1 lb

## 2018-04-16 DIAGNOSIS — C50411 Malignant neoplasm of upper-outer quadrant of right female breast: Secondary | ICD-10-CM

## 2018-04-16 DIAGNOSIS — Z79811 Long term (current) use of aromatase inhibitors: Secondary | ICD-10-CM

## 2018-04-16 DIAGNOSIS — Z170 Estrogen receptor positive status [ER+]: Secondary | ICD-10-CM

## 2018-04-16 DIAGNOSIS — K219 Gastro-esophageal reflux disease without esophagitis: Secondary | ICD-10-CM | POA: Insufficient documentation

## 2018-04-16 DIAGNOSIS — Z800 Family history of malignant neoplasm of digestive organs: Secondary | ICD-10-CM | POA: Insufficient documentation

## 2018-04-16 DIAGNOSIS — E2839 Other primary ovarian failure: Secondary | ICD-10-CM

## 2018-04-16 DIAGNOSIS — Z79899 Other long term (current) drug therapy: Secondary | ICD-10-CM | POA: Insufficient documentation

## 2018-04-16 DIAGNOSIS — Z923 Personal history of irradiation: Secondary | ICD-10-CM | POA: Insufficient documentation

## 2018-04-16 DIAGNOSIS — F419 Anxiety disorder, unspecified: Secondary | ICD-10-CM | POA: Diagnosis not present

## 2018-04-16 DIAGNOSIS — I10 Essential (primary) hypertension: Secondary | ICD-10-CM | POA: Insufficient documentation

## 2018-04-16 LAB — CMP (CANCER CENTER ONLY)
ALT: 15 U/L (ref 0–44)
AST: 16 U/L (ref 15–41)
Albumin: 4 g/dL (ref 3.5–5.0)
Alkaline Phosphatase: 66 U/L (ref 38–126)
Anion gap: 9 (ref 5–15)
BUN: 13 mg/dL (ref 6–20)
CHLORIDE: 104 mmol/L (ref 98–111)
CO2: 29 mmol/L (ref 22–32)
CREATININE: 0.83 mg/dL (ref 0.44–1.00)
Calcium: 10.2 mg/dL (ref 8.9–10.3)
GFR, Est AFR Am: >60 mL/min (ref >60)
Glucose, Bld: 88 mg/dL (ref 70–99)
Potassium: 4 mmol/L (ref 3.5–5.1)
Sodium: 142 mmol/L (ref 135–145)
Total Bilirubin: 0.2 mg/dL — ABNORMAL LOW (ref 0.3–1.2)
Total Protein: 7.4 g/dL (ref 6.5–8.1)

## 2018-04-16 LAB — CBC WITH DIFFERENTIAL (CANCER CENTER ONLY)
Abs Immature Granulocytes: 0.01 10*3/uL (ref 0.00–0.07)
Basophils Absolute: 0 10*3/uL (ref 0.0–0.1)
Basophils Relative: 0 %
EOS PCT: 1 %
Eosinophils Absolute: 0.1 10*3/uL (ref 0.0–0.5)
HEMATOCRIT: 41.3 % (ref 36.0–46.0)
Hemoglobin: 12.8 g/dL (ref 12.0–15.0)
Immature Granulocytes: 0 %
LYMPHS ABS: 1.6 10*3/uL (ref 0.7–4.0)
Lymphocytes Relative: 27 %
MCH: 26.9 pg (ref 26.0–34.0)
MCHC: 31 g/dL (ref 30.0–36.0)
MCV: 86.9 fL (ref 80.0–100.0)
MONO ABS: 0.5 10*3/uL (ref 0.1–1.0)
MONOS PCT: 9 %
Neutro Abs: 3.7 10*3/uL (ref 1.7–7.7)
Neutrophils Relative %: 63 %
Platelet Count: 238 10*3/uL (ref 150–400)
RBC: 4.75 MIL/uL (ref 3.87–5.11)
RDW: 14.1 % (ref 11.5–15.5)
WBC: 6 10*3/uL (ref 4.0–10.5)
nRBC: 0 % (ref 0.0–0.2)

## 2018-04-16 MED ORDER — ANASTROZOLE 1 MG PO TABS: 1.0000 mg | ORAL_TABLET | Freq: Every day | ORAL | 5 refills | Status: DC

## 2018-04-16 NOTE — Telephone Encounter (Signed)
Printed avs and calender of upcoming appointment. Per 11/4 los Patient also scheduled bone density

## 2018-04-16 NOTE — Progress Notes (Signed)
[facility]  [phone] Fax:([phone]) [phone]  Clinic Follow up Note   Patient Care Team: Orpah Melter, MD as PCP - General (Family Medicine) Truitt Merle, MD as Consulting Physician (Hematology) [name], MD as Consulting Physician (General Surgery) Kyung Rudd, MD as Consulting Physician (Radiation Oncology) Irene Limbo, MD as Consulting Physician (Plastic Surgery) Gardenia Phlegm, NP as Nurse Practitioner (Hematology and Oncology)   Date of Service:  04/16/2018  CHIEF COMPLAINTS:  Follow up right breast cancer, ER/PR +, HER2 -  Oncology History   Cancer Staging Malignant neoplasm of upper-outer quadrant of right breast, estrogen receptor positive ([place]) Staging form: Breast, AJCC 8th Edition - Clinical stage from 04/12/2017: Stage IIA (cT3, cN0, cM0, G1, ER: Positive, PR: Positive, HER2: Negative) - Signed by Truitt Merle, MD on 04/16/2017 - Pathologic: Stage IA (pT2, pN0(sn), cM0, G1, ER: Positive, PR: Positive, HER2: Negative, Oncotype DX score: 12) - Unsigned       Malignant neoplasm of upper-outer quadrant of right breast, estrogen receptor positive ([place])   04/07/2017 Mammogram    IMPRESSION: 1. Highly suspicious mass within the upper right breast, 12 to 12:30 o'clock axis, at posterior depth, with associated architectural distortion and pleomorphic calcifications. The mass measures 1 cm greatest dimension. The mass and associated microcalcifications measure 1.4 cm. No sonographic correlate identified. Recommend stereotactic biopsy using 3D tomosynthesis guidance. 2. Additional punctate and coarse heterogeneous calcifications within the upper outer quadrant of the right breast, 11-12 o'clock axis region, at middle depth, with a suspicious segmental distribution, spanning approximately 5 cm, the most suspicious calcifications best seen on the CC magnification views. Recommend additional stereotactic biopsy for these right  breast calcifications to exclude multifocal/multicentric disease and/or define extent of disease.    04/12/2017 Initial Biopsy    Diagnosis 1. 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 - SEE COMMENT. 2. Breast, right, needle core biopsy, loosely grouped calcs in more central, upper outer quadrant - DUCTAL CARCINOMA IN SITU WITH CALCIFICATIONS. - LOBULAR NEOPLASIA (ATYPICAL LOBULAR HYPERPLASIA). - FIBROCYSTIC CHANGES WITH ADENOSIS AND CALCIFICATIONS.    04/12/2017 Receptors her2    Estrogen Receptor: 95%, POSITIVE, STRONG STAINING INTENSITY Progesterone Receptor: 95%, POSITIVE, STRONG STAINING INTENSITY Proliferation Marker Ki67: 3% HER2: NEGATIVE    04/16/2017 Initial Diagnosis    Malignant neoplasm of upper-outer quadrant of right breast, estrogen receptor positive ([place])    04/21/2017 Mammogram    04/21/2017 Mammogram IMPRESSION: 1. 2.8 x 2.0 x 0.5 cm biopsy-proven invasive ductal carcinoma and ductal carcinoma in situ in the posterior aspect of the upper-outer quadrant of the right breast. 2. No mass or abnormal enhancement at the location of the recently biopsied low-grade ductal carcinoma in situ with calcifications and atypical lobular hyperplasia in the central aspect of the upper-outer quadrant of the right breast. 3. No evidence of malignancy on the left.    05/11/2017 Surgery    Right breast lumpectomy and SLN biopsy     05/11/2017 Pathology Results    Right lumpectomy showed a 2.1cm invasive ductal carcinoma, margins are negative, (+)ADH, 6 nodes are all negative     05/11/2017 Oncotype testing    RS 12, predicts 9-year distant recurrence 3% with tamoxifen or AI     05/26/2017 Surgery    Breast reduction by Dr. Iran Planas     05/26/2017 Pathology Results    Diagnosis 1. Breast, Mammoplasty, left - FIBROCYSTIC CHANGES WITH  ADENOSIS  AND CALCIFICATIONS. - THERE IS NO EVIDENCE OF MALIGNANCY. 2. Breast, Mammoplasty, right - FIBROCYSTIC CHANGES WITH ADENOSIS AND CALCIFICATIONS. - FAT NECROSIS. - THERE IS NO EVIDENCE OF MALIGNANCY.    07/06/2017 - 08/18/2017 Radiation Therapy    Adjuvant breast radiation with Dr. Lisbeth Renshaw    08/2017 -  Anti-estrogen oral therapy    Anastrozole '1mg'$  daily      HISTORY OF PRESENTING ILLNESS: 04/19/17 Felicia Bauer 57 y.o. female is here because of newly diagnosed right breast cancer. She presents to Breast Clinic today by herself and arrived with her son.   In the past she had spinal fusion twice in her cervical spine. She takes gabapentin and Cymbalta for her pain. She had tubal ligation, cesarean surgery and cervical ablations. She does have mitral valve prolapse. Her mother had colon cancer, diagnosed at 72 yo.   Today she reports her lump was found by screening mammogram which she gets yearly. She notes she has recent swelling in her legs, R>L. She denies any other recent change, SOB, or new pain. 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 She is seperated and has one son and one daughter. She still has periods.   CURRENT THERAPY: Adjuvant Anastrazole '1mg'$  daily.  INTERIM HISTORY:  Felicia Bauer returns for follow-up of her right breast cancer. Since her last visit she had a mamogram and b/l breast biopsy which was benign. She presents to the clinic today alone. She is tolerating anastrozole well with mild hot flashes. She is happy about her biopsy results. She is not able to sleep at night, and wakes up with excessive sweating.  She denies mood swings. She noticed bilateral ankle swelling worse at the end of the day.    MEDICAL HISTORY:  Past Medical History:  Diagnosis Date  . Arthritis    knee   . Breast cancer, right ([place]) 04/2017  . Complication of anesthesia    hard to wake up post-op; itching after anesthesia  . GERD (gastroesophageal reflux disease)   . History of vertigo     . Hypertension    states under control with med., has been on med. > 10 yr.  . Mitral valve prolapse   . Panic attacks   . Personal history of radiation therapy   . Wears partial dentures    upper    SURGICAL HISTORY: Past Surgical History:  Procedure Laterality Date  . ANTERIOR CERVICAL DECOMP/DISCECTOMY FUSION N/A 05/24/2013   Procedure: CERVICAL FOUR-FIVE, CERVICAL FIVE-SIX, CERVICAL SIX-SEVEN ANTERIOR CERVICAL DECOMPRESSION/DISCECTOMY FUSION 3 LEVELS;  Surgeon: Faythe Ghee, MD;  Location: [facility];  Service: Neurosurgery;  Laterality: N/A;  . ANTERIOR CERVICAL DECOMP/DISCECTOMY FUSION  10/27/2016   C3-4  . BREAST LUMPECTOMY Right 2018  . BREAST REDUCTION SURGERY Bilateral 05/26/2017   Procedure: LEFT BREAST MAMMARY REDUCTION  (BREAST) AND RIGHT ONCOPLASTIC RECONSTRUCTION;  Surgeon: Irene Limbo, MD;  Location: [facility];  Service: Plastics;  Laterality: Bilateral;  . CESAREAN SECTION     x 2  . DILATION AND CURETTAGE OF UTERUS  05/30/2008  . ENDOMETRIAL ABLATION  05/30/2008  . RADIOACTIVE SEED GUIDED PARTIAL MASTECTOMY WITH AXILLARY SENTINEL LYMPH NODE BIOPSY Right 05/11/2017   Procedure: RIGHT BRACKETED RADIOACTIVE SEED GUIDED PARTIAL MASTECTOMY WITH  SENTINEL LYMPH NODE BIOPSY WITH ERAS PATHWAY;  Surgeon: [name], MD;  Location: [facility];  Service: General;  Laterality: Right;  PEC BLOCK  . REDUCTION MAMMAPLASTY Bilateral   . TUBAL LIGATION  SOCIAL HISTORY: Social History   Socioeconomic History  . Marital status: Legally Separated    Spouse name: Not on file  . Number of children: 2  . Years of education: Not on file  . Highest education level: Not on file  Occupational History  . Not on file  Social Needs  . Financial resource strain: Not on file  . Food insecurity:    Worry: Not on file    Inability: Not on file  . Transportation needs:    Medical: Not on file    Non-medical: Not on file  Tobacco Use   . Smoking status: Never Smoker  . Smokeless tobacco: Never Used  Substance and Sexual Activity  . Alcohol use: No  . Drug use: No  . Sexual activity: Not on file  Lifestyle  . Physical activity:    Days per week: Not on file    Minutes per session: Not on file  . Stress: Not on file  Relationships  . Social connections:    Talks on phone: Not on file    Gets together: Not on file    Attends religious service: Not on file    Active member of club or organization: Not on file    Attends meetings of clubs or organizations: Not on file    Relationship status: Not on file  . Intimate partner violence:    Fear of current or ex partner: Not on file    Emotionally abused: Not on file    Physically abused: Not on file    Forced sexual activity: Not on file  Other Topics Concern  . Not on file  Social History Narrative  . Not on file    FAMILY HISTORY: Family History  Problem Relation Age of Onset  . Cancer Mother 38       colon cancer   . Breast cancer Neg Hx     ALLERGIES:  has No Known Allergies.  MEDICATIONS:  Current Outpatient Medications  Medication Sig Dispense Refill  . anastrozole (ARIMIDEX) 1 MG tablet Take 1 tablet (1 mg total) by mouth daily. 30 tablet 5  . celecoxib (CELEBREX) 200 MG capsule Take 200 mg by mouth daily.     . clonazePAM (KLONOPIN) 1 MG tablet Take 0.5 mg by mouth as needed for anxiety.    . diazepam (VALIUM) 2 MG tablet TAKE '2MG'$  BY MOUTH DAILY AS NEEDED    . fexofenadine-pseudoephedrine (ALLEGRA-D 24) 180-240 MG per 24 hr tablet Take 1 tablet by mouth daily as needed (for sinus).    . gabapentin (NEURONTIN) 600 MG tablet Take 1 tablet (600 mg total) by mouth 3 (three) times daily. 100 tablet 2  . hydrochlorothiazide (HYDRODIURIL) 25 MG tablet Take 25 mg by mouth daily.    Marland Kitchen HYDROcodone-acetaminophen (NORCO) 5-325 MG tablet Take 1-2 tablets by mouth every 4 (four) hours as needed for moderate pain. 30 tablet 0  . pantoprazole (PROTONIX) 40 MG  tablet Take 40 mg daily by mouth.  2  . venlafaxine XR (EFFEXOR-XR) 150 MG 24 hr capsule TAKE 1 CAPSULE (150 MG TOTAL) BY MOUTH DAILY WITH BREAKFAST. 90 capsule 2   No current facility-administered medications for this visit.     REVIEW OF SYSTEMS:  Constitutional: Denies fevers, chills or abnormal night sweats (+)  hot flashes (+) night sweats Eyes: Denies blurriness of vision, double vision or watery eyes Ears, nose, mouth, throat, and face: Denies mucositis or sore throat Respiratory: Denies cough, dyspnea or wheezes Cardiovascular: Denies palpitation,  chest discomfort, (+) LL edema Gastrointestinal:  Denies nausea, heartburn or change in bowel habits Skin: Denies abnormal skin rashes Lymphatics: Denies new lymphadenopathy or easy bruising MSK: No new joint pain Neurological:Denies numbness, tingling or new weaknesses Behavioral/Psych: Mood is stable, no new changes  All other systems were reviewed with the patient and are negative.  PHYSICAL EXAMINATION: ECOG PERFORMANCE STATUS: 0  Vitals:   04/16/18 1005  BP: 139/85  Pulse: 78  Resp: (!) 22  Temp: 98.7 F (37.1 C)  SpO2: 100%   Filed Weights   04/16/18 1005  Weight: 194 lb 1.6 oz (88 kg)    GENERAL:alert, no distress and comfortable  SKIN: skin color, texture, turgor are normal, no rashes or significant lesions EYES: normal, conjunctiva are pink and non-injected, sclera clear OROPHARYNX:no exudate, no erythema and lips, buccal mucosa, and tongue normal  NECK: supple, thyroid normal size, non-tender, without nodularity LYMPH:  no palpable lymphadenopathy in the cervical, axillary or inguinal LUNGS: clear to auscultation and percussion with normal breathing effort HEART: regular rate & rhythm and no murmurs  ABDOMEN:abdomen soft, non-tender and normal bowel sounds Musculoskeletal:no cyanosis of digits and no clubbing (+) bilateral ankle swelling at lateral [facility] PSYCH: alert & oriented x 3 with fluent  speech NEURO: no focal motor/sensory deficits BREAST: Breast inspection showed them to be symmetrical with no nipple discharge.  Surgical scar in both right breasts have healed very well.  Mild tenderness on right breast at biopsy sites, no palpable breast mass or adenopathy.  LABORATORY DATA:  I have reviewed the data as listed CBC Latest Ref Rng & Units 04/16/2018 01/15/2018 05/27/2017  WBC 4.0 - 10.5 K/uL 6.0 5.8 11.5(H)  Hemoglobin 12.0 - 15.0 g/dL 12.8 12.0 9.1(L)  Hematocrit 36.0 - 46.0 % 41.3 37.7 28.9(L)  Platelets 150 - 400 K/uL 238 208 254    CMP Latest Ref Rng & Units 04/16/2018 01/15/2018 05/19/2017  Glucose 70 - 99 mg/dL 88 97 84  BUN 6 - 20 mg/dL '13 18 12  '$ Creatinine 0.44 - 1.00 mg/dL 0.83 0.77 0.63  Sodium 135 - 145 mmol/L 142 140 137  Potassium 3.5 - 5.1 mmol/L 4.0 4.1 3.7  Chloride 98 - 111 mmol/L 104 103 102  CO2 22 - 32 mmol/L '29 26 27  '$ Calcium 8.9 - 10.3 mg/dL 10.2 9.8 9.1  Total Protein 6.5 - 8.1 g/dL 7.4 7.3 -  Total Bilirubin 0.3 - 1.2 mg/dL 0.2(L) 0.4 -  Alkaline Phos 38 - 126 U/L 66 66 -  AST 15 - 41 U/L 16 19 -  ALT 0 - 44 U/L 15 21 -    PATHOLOGY  Diagnosis 04/05/18 1. Breast, left, needle core biopsy, upper inner - FIBROCYSTIC CHANGES AND ADENOSIS WITH CALCIFICATIONS - NO MALIGNANCY IDENTIFIED 2. Breast, right, needle core biopsy, upper central - FIBROCYSTIC CHANGES, ADENOSIS AND FAT NECROSIS WITH CALCIFICATIONS - NO MALIGNANCY IDENTIFIED 3. Breast, right, needle core biopsy, upper outer - FIBROCYSTIC CHANGES, ADENOSIS AND FAT NECROSIS WITH CALCIFICATIONS - NO MALIGNANCY IDENTIFIED Microscopic Comment 1. -3. Dr. Lyndon Code reviewed the case and agrees with the above diagnosis. These results were called to [facility] on April 06, 2018.   Diagnosis 04/12/17 1. Breast, right, needle core biopsy, lateral aspect of upper outer quadrant, mass with calcs - INVASIVE DUCTAL CARCINOMA. - DUCTAL CARCINOMA IN SITU WITH CALCIFICATIONS. - SEE  COMMENT. 2. Breast, right, needle core biopsy, loosely grouped calcs in more central, upper outer quadrant - DUCTAL CARCINOMA IN SITU WITH CALCIFICATIONS. -  LOBULAR NEOPLASIA (ATYPICAL LOBULAR HYPERPLASIA). - FIBROCYSTIC CHANGES WITH ADENOSIS AND CALCIFICATIONS. - SEE COMMENT. 1 of 3 FINAL for ASTRA, GREGG ([id number]) Microscopic Comment 1. The carcinoma appears grade I. A breast prognostic profile will be performed and the results reported separately. The results were called to the [facility] on 04/13/17. 2. The carcinoma is low grade. Estrogen receptor and progesterone receptor studies will be performed and the results reported separately. The results were called to the [facility] on 04/13/17. (JBK:kh 04/13/17) 1. FLUORESCENCE IN-SITU HYBRIDIZATION Results: HER2 - NEGATIVE RATIO OF HER2/CEP17 SIGNALS 1.28 AVERAGE HER2 COPY NUMBER PER CELL 2.50 Results: IMMUNOHISTOCHEMICAL AND MORPHOMETRIC ANALYSIS PERFORMED MANUALLY Estrogen Receptor: 95%, POSITIVE, STRONG STAINING INTENSITY Progesterone Receptor: 95%, POSITIVE, STRONG STAINING INTENSITY Proliferation Marker Ki67: 3%  Diagnosis 05/11/2017 1. Breast, lumpectomy, Far Right Lateral Mass - INVASIVE DUCTAL CARCINOMA, GRADE 1, SPANNING 2.1 CM. - ATYPICAL DUCTAL HYPERPLASIA. - INVASIVE CARCINOMA COMES TO WITHIN 0.1 CM OF THE ANTERIOR MARGIN FOCALLY. - BIOPSY SITE. - SEE ONCOLOGY TABLE. 2. Breast, lumpectomy, Right Upper Outer Quadrant Medial - FIBROCYSTIC CHANGE WITH CALCIFICATIONS. - BIOPSY SITE. - NO RESIDUAL CARCINOMA IDENTIFIED. 3. Lymph node, sentinel, biopsy, Right Axillary #1 - ONE OF ONE LYMPH NODES NEGATIVE FOR CARCINOMA (0/1). 4. Lymph node, sentinel, biopsy, Right Axillary #2 - ONE OF ONE LYMPH NODES NEGATIVE FOR CARCINOMA (0/1). 5. Lymph node, sentinel, biopsy, Right Axillary #3 - ONE OF ONE LYMPH NODES NEGATIVE FOR CARCINOMA (0/1). 6. Lymph node, sentinel, biopsy, Right Axillary  #4 - ONE OF ONE LYMPH NODES NEGATIVE FOR CARCINOMA (0/1). 7. Lymph node, sentinel, biopsy, Right Axillary #5 - ONE OF ONE LYMPH NODES NEGATIVE FOR CARCINOMA (0/1). 8. Lymph node, sentinel, biopsy, Right Axillary #6 - ONE OF ONE LYMPH NODES NEGATIVE FOR CARCINOMA (0/1). Microscopic Comment 1. BREAST, INVASIVE TUMOR Procedure: Right breast lumpectomy (far lateral) and right axillary sentinel lymph node biopsies. Laterality: Right. Tumor Size: 2.1 cm. Histologic Type: Invasive ductal carcinoma. Grade: 1 1 of 4 FINAL for AILSA, MIRELES ([id number]) Microscopic Comment(continued) Tubular Differentiation: 1 Nuclear Pleomorphism: 2 Mitotic Count: 1 Ductal Carcinoma in Situ (DCIS): No residual DCIS. Extent of Tumor: Confined to breast parenchyma. Margins: Invasive carcinoma, distance from closest margin: 0.1 cm to anterior focally. DCIS, distance from closest margin: N/A. Regional Lymph Nodes: Number of Lymph Nodes Examined: 6 Number of Sentinel Lymph Nodes Examined: 6 Lymph Nodes with Macrometastases: 0 Lymph Nodes with Micrometastases: 0 Lymph Nodes with Isolated Tumor Cells: 0 Breast Prognostic Profile: Performed on biopsy [id number], see below. Will not be repeated. Estrogen Receptor: Positive, 95% strong. Progesterone Receptor: Positive, 95% strong. Her2: Negative (ratio 1.28). Ki-67: 3% Best tumor block for sendout testing: 1A Pathologic Stage Classification (pTNM, AJCC 8th Edition): Primary Tumor (pT): pT2 Regional Lymph Nodes (pN): pN0 Distant Metastases (pM): pMX Comments: None.   RADIOGRAPHIC STUDIES: I have personally reviewed the radiological images as listed and agreed with the findings in the report. Mm Diag Breast Tomo Bilateral  Result Date: 04/02/2018 CLINICAL DATA:  The patient had 2 lumpectomies in the right breast in 2018. The lateral lumpectomy was at a site of invasive breast cancer in DCIS. The more centrally located lumpectomy demonstrated DCIS and  atypical lobular hyperplasia. EXAM: DIGITAL DIAGNOSTIC BILATERAL MAMMOGRAM WITH CAD AND TOMO COMPARISON:  Previous exam(s). ACR Breast Density Category c: The breast tissue is heterogeneously dense, which may obscure small masses. FINDINGS: There are calcifications near the lateral right lumpectomy site best seen on the CC view. There are  also calcifications located behind the nipple on the CC view, likely superiorly located on the 90 degree view near the site of the second lumpectomy. There are also new calcifications located in the posterior slightly medial left breast, best seen on the CC view. Mammographic images were processed with CAD. IMPRESSION: Two groups of calcifications on the right and 1 on the left as above. RECOMMENDATION: Recommend stereotactic biopsy of the 2 groups of right breast calcifications in the 1 group of left breast calcifications. I have discussed the findings and recommendations with the patient. Results were also provided in writing at the conclusion of the visit. If applicable, a reminder letter will be sent to the patient regarding the next appointment. BI-RADS CATEGORY  4: Suspicious. Electronically Signed   By: Dorise Bullion III M.D   On: 04/02/2018 10:41   Mm Clip Placement Left  Result Date: 04/05/2018 CLINICAL DATA:  Status post stereotactic biopsy today for indeterminate calcifications in the LEFT breast. EXAM: DIAGNOSTIC LEFT MAMMOGRAM POST STEREOTACTIC BIOPSY COMPARISON:  Previous exam(s). FINDINGS: Mammographic images were obtained following stereotactic guided biopsy of indeterminate calcifications within the LEFT breast. Coil shaped clip is appropriately positioned within the upper inner quadrant of the LEFT breast at the site of the targeted calcifications. IMPRESSION: Coil shaped biopsy clip is appropriately positioned at the site of the targeted calcifications within the upper inner quadrant of the LEFT breast. Final Assessment: Post Procedure Mammograms for  Marker Placement Electronically Signed   By: Franki Cabot M.D.   On: 04/05/2018 14:13   Mm Clip Placement Right  Result Date: 04/05/2018 CLINICAL DATA:  Status post stereotactic biopsy for 2 sites of indeterminate calcifications within the RIGHT breast. EXAM: DIAGNOSTIC RIGHT MAMMOGRAM POST STEREOTACTIC BIOPSY COMPARISON:  Previous exam(s). FINDINGS: Mammographic images were obtained following stereotactic guided biopsy of 2 sites of indeterminate calcifications within the RIGHT breast. Coil shaped clip is appropriately positioned within the upper central RIGHT breast. X shaped clip is appropriately positioned within the outer RIGHT breast. IMPRESSION: 1. Coil shaped biopsy clip is appropriately positioned at the site of the targeted calcifications within the upper central RIGHT breast. 2. X shaped clip is appropriately positioned at the site of the targeted calcifications within the upper-outer quadrant of the RIGHT breast. Final Assessment: Post Procedure Mammograms for Marker Placement Electronically Signed   By: Franki Cabot M.D.   On: 04/05/2018 14:14   Mm Lt Breast Bx W Loc Dev 1st Lesion Image Bx Spec Stereo Guide  Addendum Date: 04/06/2018   ADDENDUM REPORT: 04/06/2018 14:48 ADDENDUM: Pathology revealed FIBROCYSTIC CHANGES AND ADENOSIS WITH CALCIFICATIONS of the LEFT breast, upper inner. This was found to be concordant by Dr. Franki Cabot. Pathology revealed FIBROCYSTIC CHANGES, ADENOSIS AND FAT NECROSIS WITH CALCIFICATIONS of the RIGHT breast, both locations, upper central and upper outer. This was found to be concordant by Dr. Franki Cabot. Pathology results were discussed with the patient by telephone. The patient reported doing well after the biopsies with tenderness at the sites. Post biopsy instructions and care were reviewed and questions were answered. The patient was encouraged to call [facility] for any additional concerns. The patient was instructed to  return for annual diagnostic mammography and informed a reminder notice would be sent regarding this appointment. Pathology results reported by Terie Purser, RN on 04/06/2018. Electronically Signed   By: Franki Cabot M.D.   On: 04/06/2018 14:48   Result Date: 04/06/2018 CLINICAL DATA:  History of RIGHT breast cancer in 2018,  2 sites, status post lumpectomies and radiation therapy. There was invasive ductal carcinoma and DCIS in the outer RIGHT breast. There was DCIS and ALH at the more centrally located lumpectomy. Recent diagnostic mammogram showed indeterminate calcifications at both lumpectomy sites in the RIGHT breast. New calcifications were also identified within the posterior slightly medial LEFT breast. EXAM: LEFT BREAST STEREOTACTIC CORE NEEDLE BIOPSY RIGHT BREAST STEREOTACTIC CORE NEEDLE BIOPSY x2 COMPARISON:  Previous exams. FINDINGS: The patient and I discussed the procedure of stereotactic-guided biopsy including benefits and alternatives. We discussed the high likelihood of a successful procedure. We discussed the risks of the procedure including infection, bleeding, tissue injury, clip migration, and inadequate sampling. Informed written consent was given. The usual time out protocol was performed immediately prior to the procedure. LEFT breast (1 site): Site 1: Using sterile technique and 1% Lidocaine as local anesthetic, under stereotactic guidance, a 9 gauge vacuum assisted device was used to perform core needle biopsy of calcifications in the upper inner quadrant of the left breast using a superior approach. Specimen radiograph was performed showing calcifications. Specimens with calcifications are identified for pathology. Lesion quadrant: Upper inner quadrant At the conclusion of the procedure, a coil shaped tissue marker clip was deployed into the biopsy cavity. Follow-up two-view mammogram was performed and dictated separately. RIGHT breast (2 sites): Site 2: Using sterile technique and 1%  Lidocaine as local anesthetic, under stereotactic guidance, a 9 gauge vacuum assisted device was used to perform core needle biopsy of calcifications in the upper central RIGHT breast using a superior approach. Specimen radiograph was performed showing calcifications. Specimens with calcifications are identified for pathology. Lesion quadrant: Upper central At the conclusion of the procedure, a coil shaped tissue marker clip was deployed into the biopsy cavity. Site 3: Using sterile technique and 1% Lidocaine as local anesthetic, under stereotactic guidance, a 9 gauge vacuum assisted device was used to perform core needle biopsy of calcifications in the upper outer quadrant of the RIGHT breast using a superior approach. Specimen radiograph was performed showing calcifications. Specimens with calcifications are identified for pathology. Lesion quadrant: Upper outer quadrant At the conclusion of the procedure, a X shaped tissue marker clip was deployed into the biopsy cavity. Follow-up 2-view mammogram was performed and dictated separately. IMPRESSION: 1. Stereotactic biopsy of indeterminate calcifications within the upper inner quadrant of the LEFT breast. Coil shaped clip placed at the biopsy site. 2. Stereotactic biopsy of indeterminate calcifications within the upper central RIGHT breast. Coil shaped clip placed at the biopsy site. 3. Stereotactic biopsy of indeterminate calcifications within the upper-outer quadrant of the RIGHT breast. X shaped clip placed at the biopsy site. Electronically Signed: By: Franki Cabot M.D. On: 04/05/2018 11:05   Mm Rt Breast Bx W Loc Dev 1st Lesion Image Bx Spec Stereo Guide  Addendum Date: 04/06/2018   ADDENDUM REPORT: 04/06/2018 14:48 ADDENDUM: Pathology revealed FIBROCYSTIC CHANGES AND ADENOSIS WITH CALCIFICATIONS of the LEFT breast, upper inner. This was found to be concordant by Dr. Franki Cabot. Pathology revealed FIBROCYSTIC CHANGES, ADENOSIS AND FAT NECROSIS WITH  CALCIFICATIONS of the RIGHT breast, both locations, upper central and upper outer. This was found to be concordant by Dr. Franki Cabot. Pathology results were discussed with the patient by telephone. The patient reported doing well after the biopsies with tenderness at the sites. Post biopsy instructions and care were reviewed and questions were answered. The patient was encouraged to call [facility] for any additional concerns. The patient was instructed  to return for annual diagnostic mammography and informed a reminder notice would be sent regarding this appointment. Pathology results reported by Terie Purser, RN on 04/06/2018. Electronically Signed   By: Franki Cabot M.D.   On: 04/06/2018 14:48   Result Date: 04/06/2018 CLINICAL DATA:  History of RIGHT breast cancer in 2018, 2 sites, status post lumpectomies and radiation therapy. There was invasive ductal carcinoma and DCIS in the outer RIGHT breast. There was DCIS and ALH at the more centrally located lumpectomy. Recent diagnostic mammogram showed indeterminate calcifications at both lumpectomy sites in the RIGHT breast. New calcifications were also identified within the posterior slightly medial LEFT breast. EXAM: LEFT BREAST STEREOTACTIC CORE NEEDLE BIOPSY RIGHT BREAST STEREOTACTIC CORE NEEDLE BIOPSY x2 COMPARISON:  Previous exams. FINDINGS: The patient and I discussed the procedure of stereotactic-guided biopsy including benefits and alternatives. We discussed the high likelihood of a successful procedure. We discussed the risks of the procedure including infection, bleeding, tissue injury, clip migration, and inadequate sampling. Informed written consent was given. The usual time out protocol was performed immediately prior to the procedure. LEFT breast (1 site): Site 1: Using sterile technique and 1% Lidocaine as local anesthetic, under stereotactic guidance, a 9 gauge vacuum assisted device was used to perform core needle  biopsy of calcifications in the upper inner quadrant of the left breast using a superior approach. Specimen radiograph was performed showing calcifications. Specimens with calcifications are identified for pathology. Lesion quadrant: Upper inner quadrant At the conclusion of the procedure, a coil shaped tissue marker clip was deployed into the biopsy cavity. Follow-up two-view mammogram was performed and dictated separately. RIGHT breast (2 sites): Site 2: Using sterile technique and 1% Lidocaine as local anesthetic, under stereotactic guidance, a 9 gauge vacuum assisted device was used to perform core needle biopsy of calcifications in the upper central RIGHT breast using a superior approach. Specimen radiograph was performed showing calcifications. Specimens with calcifications are identified for pathology. Lesion quadrant: Upper central At the conclusion of the procedure, a coil shaped tissue marker clip was deployed into the biopsy cavity. Site 3: Using sterile technique and 1% Lidocaine as local anesthetic, under stereotactic guidance, a 9 gauge vacuum assisted device was used to perform core needle biopsy of calcifications in the upper outer quadrant of the RIGHT breast using a superior approach. Specimen radiograph was performed showing calcifications. Specimens with calcifications are identified for pathology. Lesion quadrant: Upper outer quadrant At the conclusion of the procedure, a X shaped tissue marker clip was deployed into the biopsy cavity. Follow-up 2-view mammogram was performed and dictated separately. IMPRESSION: 1. Stereotactic biopsy of indeterminate calcifications within the upper inner quadrant of the LEFT breast. Coil shaped clip placed at the biopsy site. 2. Stereotactic biopsy of indeterminate calcifications within the upper central RIGHT breast. Coil shaped clip placed at the biopsy site. 3. Stereotactic biopsy of indeterminate calcifications within the upper-outer quadrant of the RIGHT  breast. X shaped clip placed at the biopsy site. Electronically Signed: By: Franki Cabot M.D. On: 04/05/2018 11:05   Mm Rt Breast Bx W Loc Dev Ea Ad Lesion Img Bx Spec Stereo Guide  Addendum Date: 04/06/2018   ADDENDUM REPORT: 04/06/2018 14:48 ADDENDUM: Pathology revealed FIBROCYSTIC CHANGES AND ADENOSIS WITH CALCIFICATIONS of the LEFT breast, upper inner. This was found to be concordant by Dr. Franki Cabot. Pathology revealed FIBROCYSTIC CHANGES, ADENOSIS AND FAT NECROSIS WITH CALCIFICATIONS of the RIGHT breast, both locations, upper central and upper outer. This was found to be concordant by  Dr. Franki Cabot. Pathology results were discussed with the patient by telephone. The patient reported doing well after the biopsies with tenderness at the sites. Post biopsy instructions and care were reviewed and questions were answered. The patient was encouraged to call [facility] for any additional concerns. The patient was instructed to return for annual diagnostic mammography and informed a reminder notice would be sent regarding this appointment. Pathology results reported by Terie Purser, RN on 04/06/2018. Electronically Signed   By: Franki Cabot M.D.   On: 04/06/2018 14:48   Result Date: 04/06/2018 CLINICAL DATA:  History of RIGHT breast cancer in 2018, 2 sites, status post lumpectomies and radiation therapy. There was invasive ductal carcinoma and DCIS in the outer RIGHT breast. There was DCIS and ALH at the more centrally located lumpectomy. Recent diagnostic mammogram showed indeterminate calcifications at both lumpectomy sites in the RIGHT breast. New calcifications were also identified within the posterior slightly medial LEFT breast. EXAM: LEFT BREAST STEREOTACTIC CORE NEEDLE BIOPSY RIGHT BREAST STEREOTACTIC CORE NEEDLE BIOPSY x2 COMPARISON:  Previous exams. FINDINGS: The patient and I discussed the procedure of stereotactic-guided biopsy including benefits and  alternatives. We discussed the high likelihood of a successful procedure. We discussed the risks of the procedure including infection, bleeding, tissue injury, clip migration, and inadequate sampling. Informed written consent was given. The usual time out protocol was performed immediately prior to the procedure. LEFT breast (1 site): Site 1: Using sterile technique and 1% Lidocaine as local anesthetic, under stereotactic guidance, a 9 gauge vacuum assisted device was used to perform core needle biopsy of calcifications in the upper inner quadrant of the left breast using a superior approach. Specimen radiograph was performed showing calcifications. Specimens with calcifications are identified for pathology. Lesion quadrant: Upper inner quadrant At the conclusion of the procedure, a coil shaped tissue marker clip was deployed into the biopsy cavity. Follow-up two-view mammogram was performed and dictated separately. RIGHT breast (2 sites): Site 2: Using sterile technique and 1% Lidocaine as local anesthetic, under stereotactic guidance, a 9 gauge vacuum assisted device was used to perform core needle biopsy of calcifications in the upper central RIGHT breast using a superior approach. Specimen radiograph was performed showing calcifications. Specimens with calcifications are identified for pathology. Lesion quadrant: Upper central At the conclusion of the procedure, a coil shaped tissue marker clip was deployed into the biopsy cavity. Site 3: Using sterile technique and 1% Lidocaine as local anesthetic, under stereotactic guidance, a 9 gauge vacuum assisted device was used to perform core needle biopsy of calcifications in the upper outer quadrant of the RIGHT breast using a superior approach. Specimen radiograph was performed showing calcifications. Specimens with calcifications are identified for pathology. Lesion quadrant: Upper outer quadrant At the conclusion of the procedure, a X shaped tissue marker clip was  deployed into the biopsy cavity. Follow-up 2-view mammogram was performed and dictated separately. IMPRESSION: 1. Stereotactic biopsy of indeterminate calcifications within the upper inner quadrant of the LEFT breast. Coil shaped clip placed at the biopsy site. 2. Stereotactic biopsy of indeterminate calcifications within the upper central RIGHT breast. Coil shaped clip placed at the biopsy site. 3. Stereotactic biopsy of indeterminate calcifications within the upper-outer quadrant of the RIGHT breast. X shaped clip placed at the biopsy site. Electronically Signed: By: Franki Cabot M.D. On: 04/05/2018 11:05   Diagnostic Mammogram b/l 04/02/18 IMPRESSION: Two groups of calcifications on the right and 1 on the left as above. RECOMMENDATION: Recommend stereotactic  biopsy of the 2 groups of right breast calcifications in the 1 group of left breast calcifications.   ASSESSMENT & PLAN:  DOMANIQUE HUESMAN is a 57 y.o. premenopausal African-American female with a history of HTN an d mitral valve disorder.    1. 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 -the Oncotype Dx result was reviewed with her in details. She has low risk based on the recurrence score, which predicts 9 year distant recurrence after 5 years of tamoxifen 3%.  No benefit of adjuvant chemotherapy.  -Giving the strong ER and PR expression in her tumor I previously recommended adjuvant endocrine therapy with anastrozole 1 mg daily. -She finished radiation with Dr. Lisbeth Renshaw on 08/18/2017 and is currently on '1mg'$  anastrozole daily since 08/2017. -She has had hot flash since she came off the over-the-counter medicine for her hot flash when she was diagnosed with cancer.  I have previously started her  on Effexor 75 mg daily in 07/2017, and increased to '150mg'$  daily in 01/2018, her hot flash has improved. -She has been on adjuvant anastrozole, tolerating moderately well overall --She also reports bilateral hand joint stiffness and cramps, improved lately  -Her 04/02/18 Mammogram showed calcification in bilateral breasts. She underwent b/l breast biopsy on 04/05/18 which showed the calcifications to be benign. There was no evidence of malignancy.  -She is clinically doing well. Lab reviewed, her CBC and CMP are within normal limits. Her physical exam was unremarkable, except for tenderness at biopsy site . There is no clinical concern for recurrence. -f/u in 6 months, continue letrozole   2.  Hypertension, GERD, anxiety -Follow-up with primary care physician  3.  Hot flash -She was on over-the-counter medication for her hot flash, which she stopped when she was diagnosed with cancer.  Her health rash has gotten worse -I previously started her on Effexor in February 2019, increased to 150 mg daily due to persistent severe hot flash. -She is also on Neurontin for her back pain, which helps her hot flash also, overall manageable   4. Bone health -pt has not had bone density scan  -will schedule a DEXA scan to be done next month -We discussed the potential adverse impact of anastrozole for her bone density.  I encouraged her to take calcium and vitamin D.  PLAN:  -I reviewed her lab and recent biopsy results  -DEXA scan in 05/2018 -Lab and f/u in 6 months -I refilled Anastrozole, she will continue    All questions were answered. The patient knows to call the clinic with any problems, questions or concerns. I spent 20 minutes counseling the patient face to face. The total time spent in the appointment was 25 minutes and more than 50% was on counseling.  Dierdre Searles Dweik am acting as scribe for Dr. Truitt Merle.  I have reviewed the above documentation for accuracy and completeness, and I agree with  the above.      Truitt Merle, MD 04/16/2018

## 2018-04-26 DIAGNOSIS — M4712 Other spondylosis with myelopathy, cervical region: Secondary | ICD-10-CM | POA: Diagnosis not present

## 2018-04-26 DIAGNOSIS — Z981 Arthrodesis status: Secondary | ICD-10-CM | POA: Diagnosis not present

## 2018-04-26 DIAGNOSIS — G8928 Other chronic postprocedural pain: Secondary | ICD-10-CM | POA: Diagnosis not present

## 2018-04-30 DIAGNOSIS — Z923 Personal history of irradiation: Secondary | ICD-10-CM | POA: Diagnosis not present

## 2018-04-30 DIAGNOSIS — Z853 Personal history of malignant neoplasm of breast: Secondary | ICD-10-CM | POA: Diagnosis not present

## 2018-04-30 DIAGNOSIS — C50411 Malignant neoplasm of upper-outer quadrant of right female breast: Secondary | ICD-10-CM | POA: Diagnosis not present

## 2018-04-30 DIAGNOSIS — Z170 Estrogen receptor positive status [ER+]: Secondary | ICD-10-CM | POA: Diagnosis not present

## 2018-04-30 DIAGNOSIS — Z9889 Other specified postprocedural states: Secondary | ICD-10-CM | POA: Diagnosis not present

## 2018-05-02 ENCOUNTER — Ambulatory Visit
Admission: RE | Admit: 2018-05-02 | Discharge: 2018-05-02 | Disposition: A | Discharge status: Home / Self Care | Payer: 59 | Source: Ambulatory Visit | Attending: Hematology | Admitting: Hematology

## 2018-05-02 DIAGNOSIS — Z780 Asymptomatic menopausal state: Secondary | ICD-10-CM | POA: Diagnosis not present

## 2018-05-02 DIAGNOSIS — Z13820 Encounter for screening for osteoporosis: Secondary | ICD-10-CM | POA: Diagnosis not present

## 2018-05-02 DIAGNOSIS — E2839 Other primary ovarian failure: Secondary | ICD-10-CM

## 2018-05-07 ENCOUNTER — Telehealth: Payer: Self-pay

## 2018-05-07 DIAGNOSIS — M25562 Pain in left knee: Secondary | ICD-10-CM | POA: Diagnosis not present

## 2018-05-07 DIAGNOSIS — M1712 Unilateral primary osteoarthritis, left knee: Secondary | ICD-10-CM | POA: Diagnosis not present

## 2018-05-07 NOTE — Telephone Encounter (Signed)
-----   Message from Truitt Merle, MD sent at 05/06/2018  4:42 PM EST ----- Please let pt know that her DEXA was normal, thanks.   Truitt Merle  05/06/2018

## 2018-05-07 NOTE — Telephone Encounter (Signed)
Left voice message for patient regarding DEXA scan, per Dr. Burr Medico notified her scan was normal.

## 2018-05-17 DIAGNOSIS — M1712 Unilateral primary osteoarthritis, left knee: Secondary | ICD-10-CM | POA: Diagnosis not present

## 2018-05-21 DIAGNOSIS — M1712 Unilateral primary osteoarthritis, left knee: Secondary | ICD-10-CM | POA: Diagnosis not present

## 2018-05-24 DIAGNOSIS — M1712 Unilateral primary osteoarthritis, left knee: Secondary | ICD-10-CM | POA: Diagnosis not present

## 2018-05-29 DIAGNOSIS — M1712 Unilateral primary osteoarthritis, left knee: Secondary | ICD-10-CM | POA: Diagnosis not present

## 2018-05-31 DIAGNOSIS — M1712 Unilateral primary osteoarthritis, left knee: Secondary | ICD-10-CM | POA: Diagnosis not present

## 2018-05-31 DIAGNOSIS — M25562 Pain in left knee: Secondary | ICD-10-CM | POA: Diagnosis not present

## 2018-06-04 DIAGNOSIS — M1712 Unilateral primary osteoarthritis, left knee: Secondary | ICD-10-CM | POA: Diagnosis not present

## 2018-06-08 DIAGNOSIS — M1712 Unilateral primary osteoarthritis, left knee: Secondary | ICD-10-CM | POA: Diagnosis not present

## 2018-06-11 DIAGNOSIS — M1711 Unilateral primary osteoarthritis, right knee: Secondary | ICD-10-CM | POA: Diagnosis not present

## 2018-06-11 DIAGNOSIS — M25562 Pain in left knee: Secondary | ICD-10-CM | POA: Diagnosis not present

## 2018-06-11 DIAGNOSIS — M1712 Unilateral primary osteoarthritis, left knee: Secondary | ICD-10-CM | POA: Diagnosis not present

## 2018-06-18 DIAGNOSIS — M1712 Unilateral primary osteoarthritis, left knee: Secondary | ICD-10-CM | POA: Diagnosis not present

## 2018-06-20 DIAGNOSIS — M1712 Unilateral primary osteoarthritis, left knee: Secondary | ICD-10-CM | POA: Diagnosis not present

## 2018-06-25 DIAGNOSIS — M1712 Unilateral primary osteoarthritis, left knee: Secondary | ICD-10-CM | POA: Diagnosis not present

## 2018-07-02 DIAGNOSIS — M1712 Unilateral primary osteoarthritis, left knee: Secondary | ICD-10-CM | POA: Diagnosis not present

## 2018-07-27 DIAGNOSIS — Z981 Arthrodesis status: Secondary | ICD-10-CM | POA: Diagnosis not present

## 2018-07-27 DIAGNOSIS — G8928 Other chronic postprocedural pain: Secondary | ICD-10-CM | POA: Diagnosis not present

## 2018-07-27 DIAGNOSIS — M179 Osteoarthritis of knee, unspecified: Secondary | ICD-10-CM | POA: Diagnosis not present

## 2018-08-17 DIAGNOSIS — M1712 Unilateral primary osteoarthritis, left knee: Secondary | ICD-10-CM | POA: Diagnosis not present

## 2018-08-17 DIAGNOSIS — M1711 Unilateral primary osteoarthritis, right knee: Secondary | ICD-10-CM | POA: Diagnosis not present

## 2018-08-24 DIAGNOSIS — M1712 Unilateral primary osteoarthritis, left knee: Secondary | ICD-10-CM | POA: Diagnosis not present

## 2018-08-24 DIAGNOSIS — M1711 Unilateral primary osteoarthritis, right knee: Secondary | ICD-10-CM | POA: Diagnosis not present

## 2018-08-31 DIAGNOSIS — M1712 Unilateral primary osteoarthritis, left knee: Secondary | ICD-10-CM | POA: Diagnosis not present

## 2018-08-31 DIAGNOSIS — M7052 Other bursitis of knee, left knee: Secondary | ICD-10-CM | POA: Diagnosis not present

## 2018-08-31 DIAGNOSIS — M1711 Unilateral primary osteoarthritis, right knee: Secondary | ICD-10-CM | POA: Diagnosis not present

## 2018-09-24 IMAGING — MG 2D DIGITAL DIAGNOSTIC UNILATERAL RIGHT MAMMOGRAM WITH CAD AND AD
8 of 14 series · 8 of 26 positions shown · non-contrast
Comparison: Previous exams including recent screening mammogram
dated 03/29/2017.

CLINICAL DATA: Patient returns today to evaluate calcifications and
a possible mass in the right breast identified on recent screening
mammogram.

EXAM:
2D DIGITAL DIAGNOSTIC RIGHT MAMMOGRAM WITH CAD AND ADJUNCT TOMO
ULTRASOUND RIGHT BREAST

[R ML (1 of 3)]
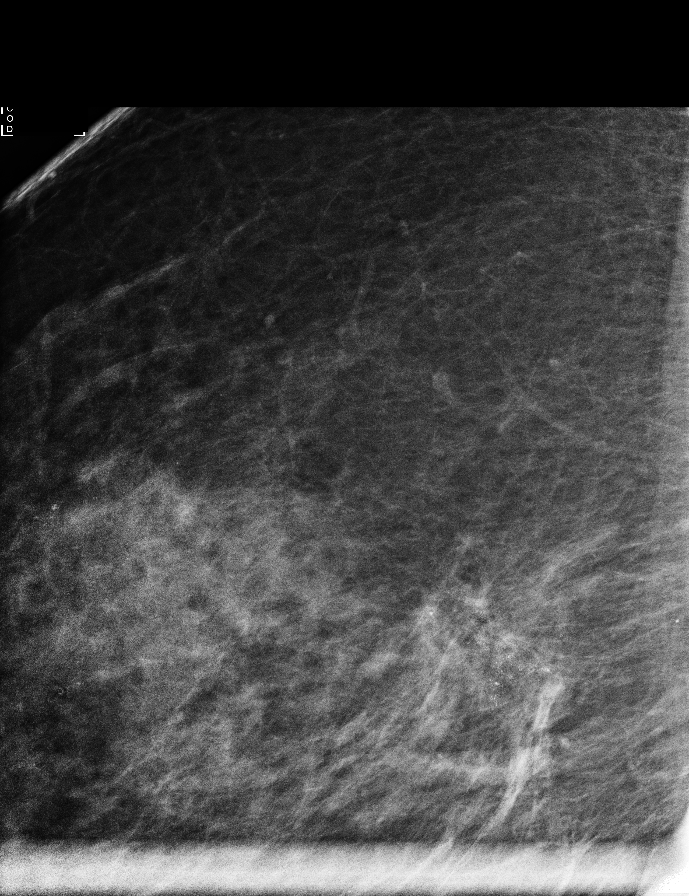

[R ML (2 of 3)]
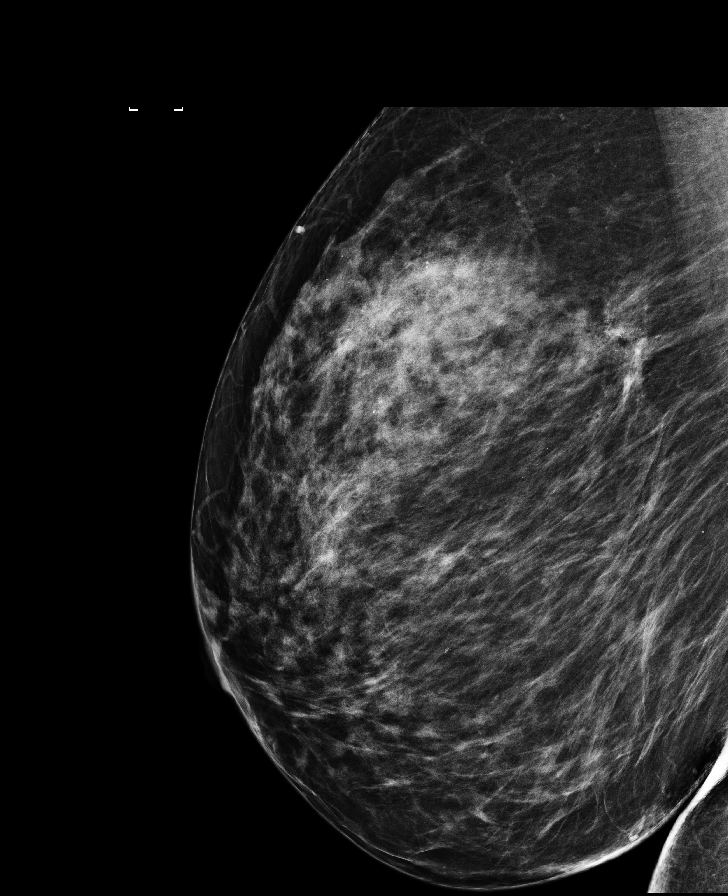

[R CC (1 of 4)]
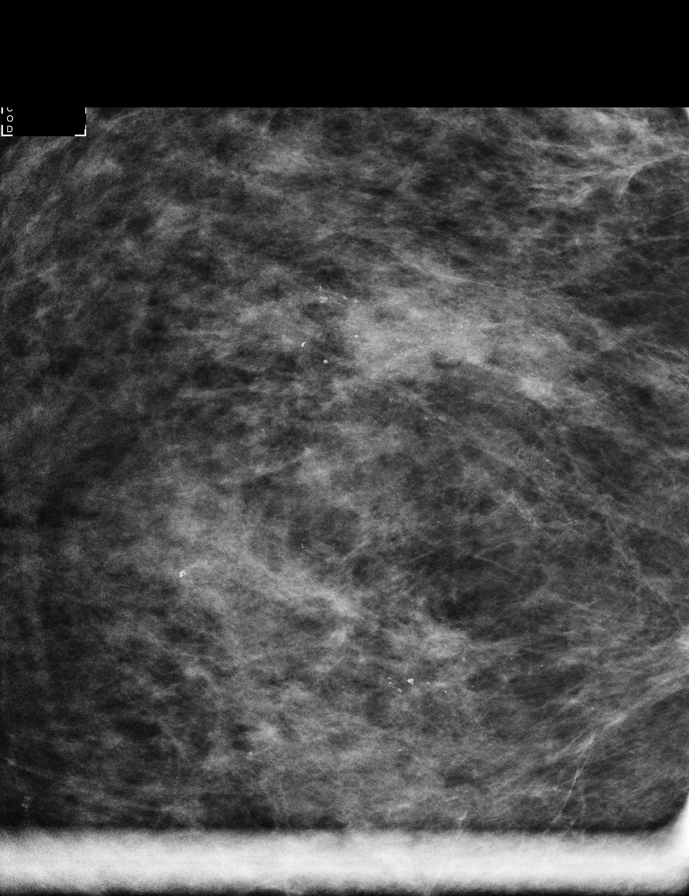

[R ML (3 of 3)]
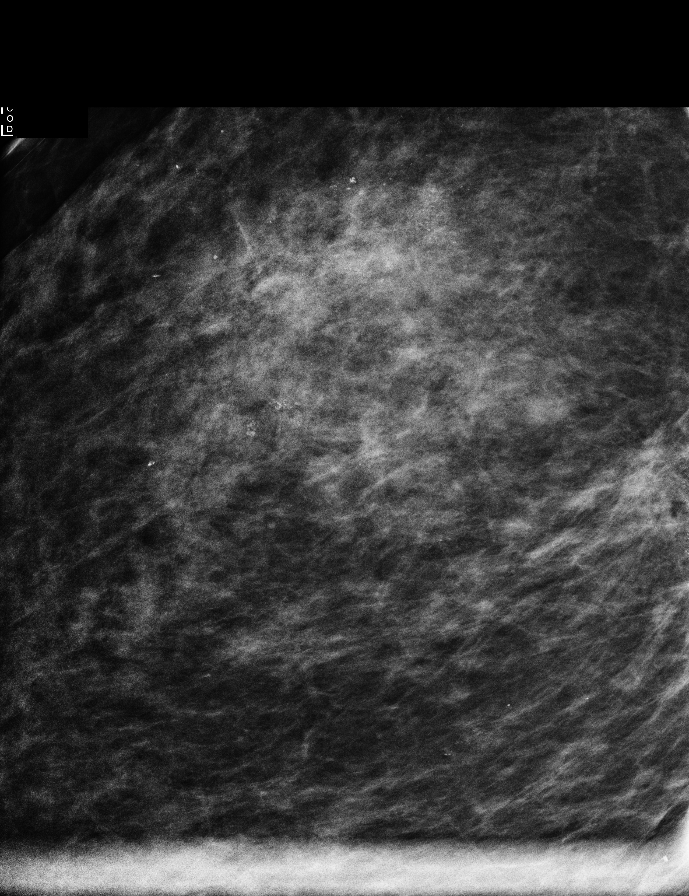

[R CC (2 of 4)]
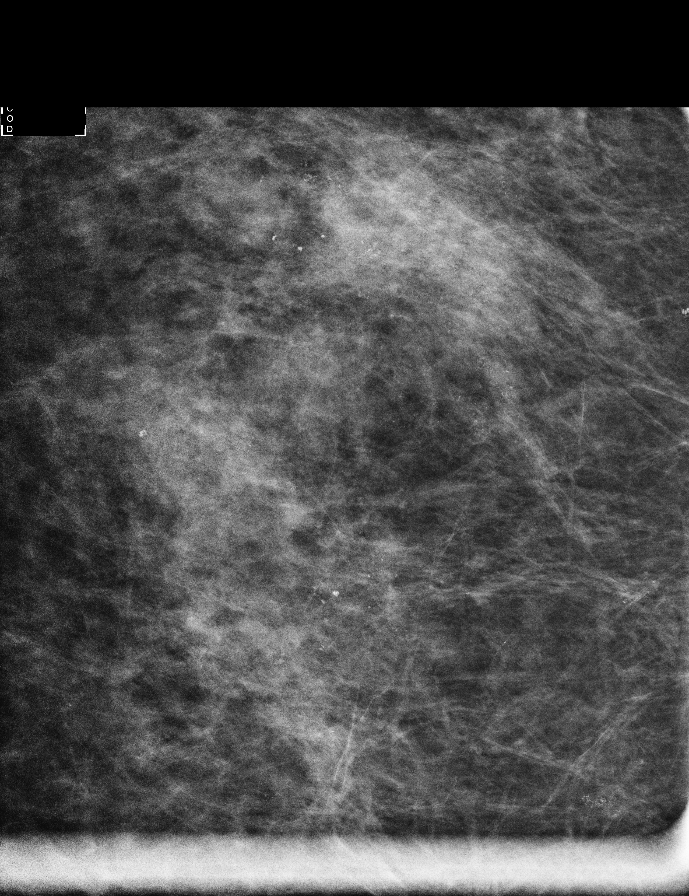

[R CC (3 of 4)]
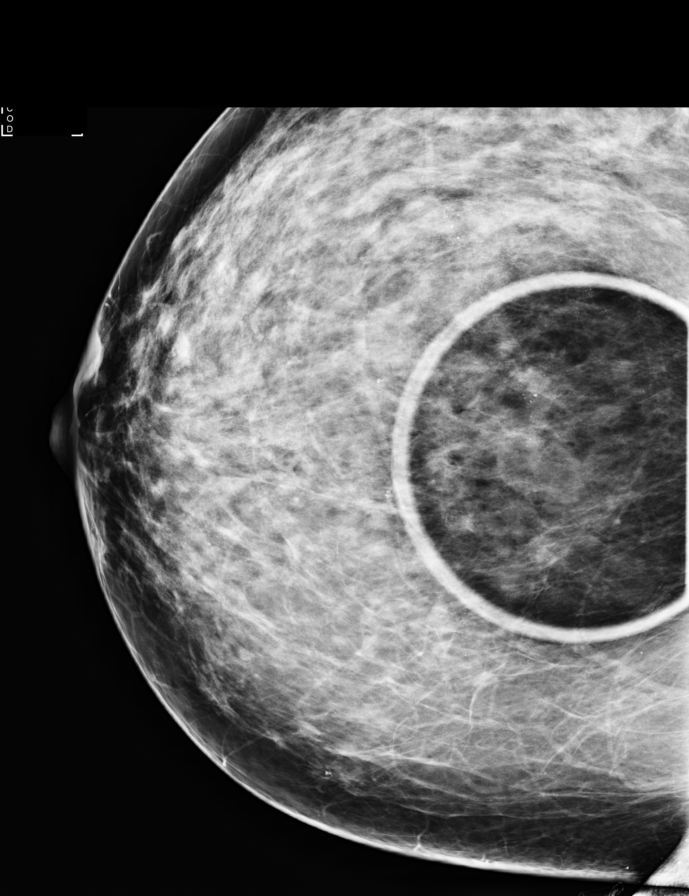

[R CC (4 of 4)]
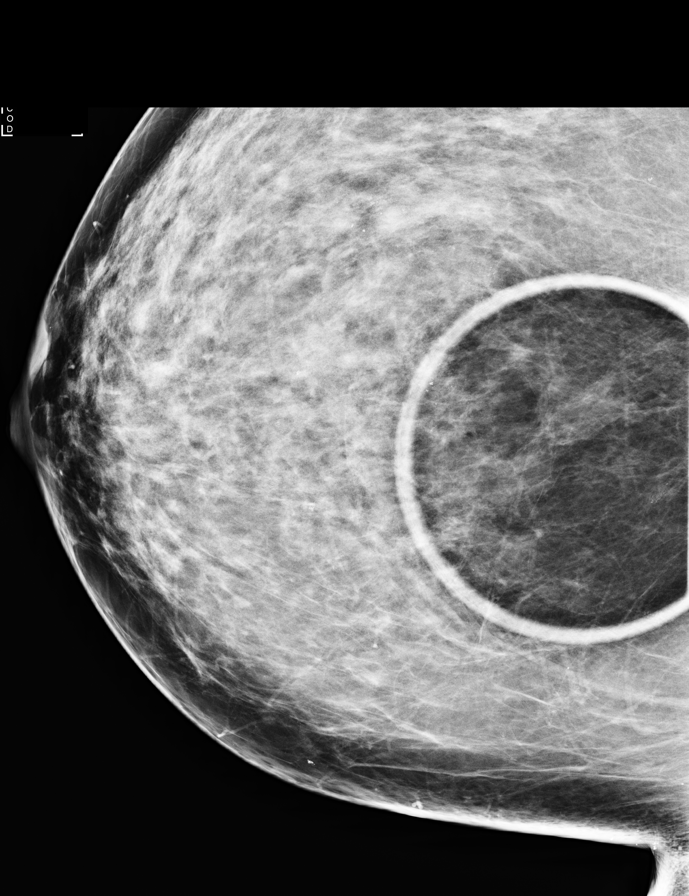

[R MLO]
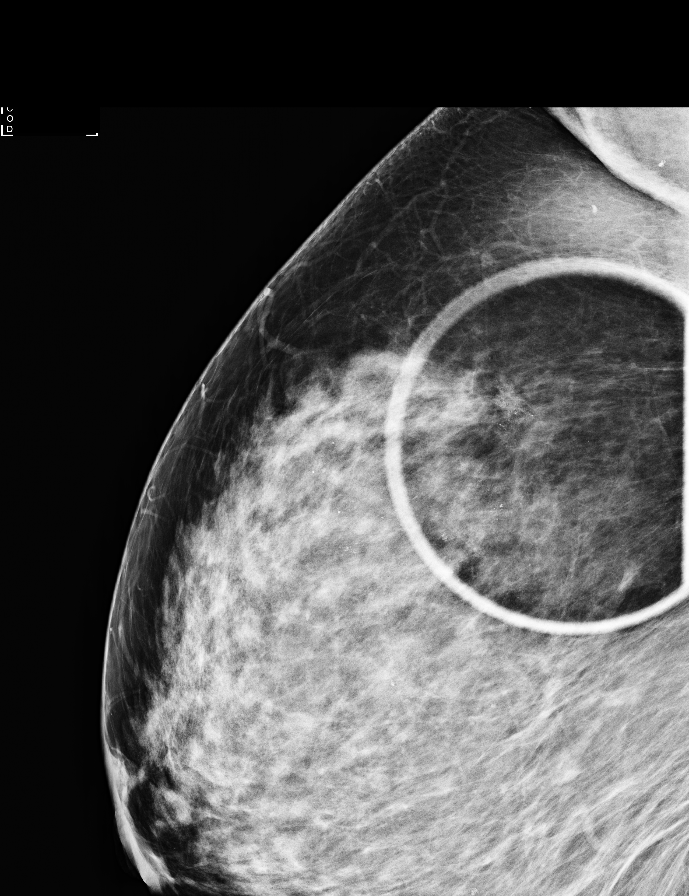

[8 of 26 positions shown; findings below may reference images not displayed]

ACR Breast Density Category c: The breast tissue is heterogeneously
dense, which may obscure small masses.
FINDINGS: On today's additional views with spot compression and 3D
tomosynthesis, an irregular mass is confirmed in the upper right
breast, 12 o'clock axis region, at posterior depth, with associated
architectural distortion and pleomorphic calcifications. The mass
measures 1 cm greatest dimension. The mass and associated
microcalcifications measure 1.4 cm.

There are additional punctate and coarse heterogeneous
calcifications in the upper right breast, 11-12 o'clock axis region,
at middle depth, spanning approximately 5 cm as measured on a CC
magnification view, most suspicious calcifications are best seen on
the CC magnification views.

Mammographic images were processed with CAD.

Targeted ultrasound is performed, showing no sonographic correlate
for the right breast mass. Ultrasound was also performed of the
upper-outer quadrant of the right breast corresponding to the area
of calcifications showing only normal dense fibroglandular tissues
and no suspicious solid or cystic masses.

Right axilla was evaluated with ultrasound showing no enlarged or
morphologically abnormal lymph nodes.
IMPRESSION: 1. Highly suspicious mass within the upper right breast, 12 to 12:30
o'clock axis, at posterior depth, with associated architectural
distortion and pleomorphic calcifications. The mass measures 1 cm
greatest dimension. The mass and associated microcalcifications
measure 1.4 cm. No sonographic correlate identified. Recommend
stereotactic biopsy using 3D tomosynthesis guidance.
2. Additional punctate and coarse heterogeneous calcifications
within the upper outer quadrant of the right breast, 11-12 o'clock
axis region, at middle depth, with a suspicious segmental
distribution, spanning approximately 5 cm, the most suspicious
calcifications best seen on the CC magnification views. Recommend
additional stereotactic biopsy for these right breast calcifications
to exclude multifocal/multicentric disease and/or define extent of
disease.

RECOMMENDATION:
1. Stereotactic biopsy, with 3D tomosynthesis guidance, for the
highly suspicious mass in the upper right breast, 12 to 12:30
o'clock axis, at posterior depth, with associated architecture
distortion and pleomorphic calcifications.
2. Stereotactic biopsy for the additional suspicious calcifications
in the upper outer quadrant of the right breast.

Stereotactic biopsies are scheduled for [DATE]st at [DATE] p.m..

I have discussed the findings and recommendations with the patient.
Results were also provided in writing at the conclusion of the
visit. If applicable, a reminder letter will be sent to the patient
regarding the next appointment.

BI-RADS CATEGORY  5: Highly suggestive of malignancy.

## 2018-10-03 ENCOUNTER — Telehealth: Payer: Self-pay

## 2018-10-03 ENCOUNTER — Encounter: Payer: Self-pay | Admitting: Hematology

## 2018-10-03 NOTE — Telephone Encounter (Signed)
I called Ms. Rozzell to discuss her hot flashes, which she describes as "really bad" and often has to change clothes at night. She was drinking some herbal tea but now can't find it in stores. She sent photos of "Estroven" OTC product, but I explained that it contains soy which is similar to estrogen and I advised against using this given her breast cancer was ER+. She verbalizes understanding. She is on 2700 mg gabapentin and 150 mg effexor already. I suggest once COVID19 resolves she could try accupuncture; she notes this helped nerve pain in the past and will consider it, but not covered by insurance. She intends to stay on AI and tolerate the hot flashes. She has webex visit with Dr. Burr Medico on 10/15/18 and is aware.  Cira Rue, NP  10/03/2018

## 2018-10-03 NOTE — Telephone Encounter (Signed)
Lacie, I agree with you. Could you give her a call and discuss management of hot flush? Thanks   Truitt Merle MD

## 2018-10-03 NOTE — Telephone Encounter (Signed)
I did a quick search on this. It contains soy, which is similar to estrogen. Since her breast cancer was ER positive, I recommend AGAINST this. Does she still take gabapentin and effexor?

## 2018-10-03 NOTE — Telephone Encounter (Signed)
Patients calls with complaint of hot flashes.  She wants to know if it is okay to take and OTC medication called Estroven.  It is supposed to be drug free and estrogen free.  Her [phone]

## 2018-10-12 ENCOUNTER — Telehealth: Payer: Self-pay | Admitting: Hematology

## 2018-10-12 NOTE — Telephone Encounter (Signed)
Spoke with pt regarding her Webex visit - they stated that they do not have the capability to do it. They would request a phone call visit. °

## 2018-10-12 NOTE — Progress Notes (Signed)
[place]   [phone] [phone]) [phone]   Clinic Follow up Note   Patient Care Team: Orpah Melter, MD as PCP - General (Family Medicine) Truitt Merle, MD as Consulting Physician (Hematology) [name], MD as Consulting Physician (General Surgery) Kyung Rudd, MD as Consulting Physician (Radiation Oncology) Irene Limbo, MD as Consulting Physician (Plastic Surgery) Gardenia Phlegm, NP as Nurse Practitioner (Hematology and Oncology)   I connected with Leticia Clas on 10/15/2018 at  8:45 AM EDT by telephone visit and verified that I am speaking with the correct person using two identifiers.  I discussed the limitations, risks, security and privacy concerns of performing an evaluation and management service by telephone and the availability of in person appointments. I also discussed with the patient that there may be a patient responsible charge related to this service. The patient expressed understanding and agreed to proceed.   Patient's location:  Her home  Provider's location:  My Office  CHIEF COMPLAINT: F/u of right breast cancer   SUMMARY OF ONCOLOGIC HISTORY: Oncology History   Cancer Staging Malignant neoplasm of upper-outer quadrant of right breast, estrogen receptor positive ([place]) Staging form: Breast, AJCC 8th Edition - Clinical stage from 04/12/2017: Stage IIA (cT3, cN0, cM0, G1, ER: Positive, PR: Positive, HER2: Negative) - Signed by Truitt Merle, MD on 04/16/2017 - Pathologic: Stage IA (pT2, pN0(sn), cM0, G1, ER: Positive, PR: Positive, HER2: Negative, Oncotype DX score: 12) - Unsigned       Malignant neoplasm of upper-outer quadrant of right breast, estrogen receptor positive ([place])   04/07/2017 Mammogram    IMPRESSION: 1. Highly suspicious mass within the upper right breast, 12 to 12:30 o'clock axis, at posterior depth, with associated architectural distortion and pleomorphic calcifications. The mass measures 1 cm  greatest dimension. The mass and associated microcalcifications measure 1.4 cm. No sonographic correlate identified. Recommend stereotactic biopsy using 3D tomosynthesis guidance. 2. Additional punctate and coarse heterogeneous calcifications within the upper outer quadrant of the right breast, 11-12 o'clock axis region, at middle depth, with a suspicious segmental distribution, spanning approximately 5 cm, the most suspicious calcifications best seen on the CC magnification views. Recommend additional stereotactic biopsy for these right breast calcifications to exclude multifocal/multicentric disease and/or define extent of disease.    04/12/2017 Initial Biopsy    Diagnosis 1. 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 - SEE COMMENT. 2. Breast, right, needle core biopsy, loosely grouped calcs in more central, upper outer quadrant - DUCTAL CARCINOMA IN SITU WITH CALCIFICATIONS. - LOBULAR NEOPLASIA (ATYPICAL LOBULAR HYPERPLASIA). - FIBROCYSTIC CHANGES WITH ADENOSIS AND CALCIFICATIONS.    04/12/2017 Receptors her2    Estrogen Receptor: 95%, POSITIVE, STRONG STAINING INTENSITY Progesterone Receptor: 95%, POSITIVE, STRONG STAINING INTENSITY Proliferation Marker Ki67: 3% HER2: NEGATIVE    04/16/2017 Initial Diagnosis    Malignant neoplasm of upper-outer quadrant of right breast, estrogen receptor positive ([place])    04/21/2017 Mammogram    04/21/2017 Mammogram IMPRESSION: 1. 2.8 x 2.0 x 0.5 cm biopsy-proven invasive ductal carcinoma and ductal carcinoma in situ in the posterior aspect of the upper-outer quadrant of the right breast. 2. No mass or abnormal enhancement at the location of the recently biopsied low-grade ductal carcinoma in situ with calcifications and atypical lobular hyperplasia in the central aspect of the upper-outer quadrant of the right  breast. 3. No evidence of malignancy on the left.  05/11/2017 Surgery    Right breast lumpectomy and SLN biopsy     05/11/2017 Pathology Results    Right lumpectomy showed a 2.1cm invasive ductal carcinoma, margins are negative, (+)ADH, 6 nodes are all negative     05/11/2017 Oncotype testing    RS 12, predicts 9-year distant recurrence 3% with tamoxifen or AI     05/26/2017 Surgery    Breast reduction by Dr. Iran Planas     05/26/2017 Pathology Results    Diagnosis 1. Breast, Mammoplasty, left - FIBROCYSTIC CHANGES WITH ADENOSIS AND CALCIFICATIONS. - THERE IS NO EVIDENCE OF MALIGNANCY. 2. Breast, Mammoplasty, right - FIBROCYSTIC CHANGES WITH ADENOSIS AND CALCIFICATIONS. - FAT NECROSIS. - THERE IS NO EVIDENCE OF MALIGNANCY.    07/06/2017 - 08/18/2017 Radiation Therapy    Adjuvant breast radiation with Dr. Lisbeth Renshaw    08/2017 -  Anti-estrogen oral therapy    Anastrozole 17m daily      CURRENT THERAPY:  Adjuvant Anastrozole 119mdaily starting 08/2017  INTERVAL HISTORY:  ArCAROLINE LONGIEs here for a follow up of right breast cancer. She was able to identify herself by birth date. She notes she is doing well. She denies any new changes in the last 6 months. She note she is tolerating Anastrozole well. She still has significant hot flashes which are manageable. She is on Effexor and Gabapentin currently. I reviewed her medication list with her. She notes her weight is stable overall. She has not been exercising due to convenience and arthritis in her knees. She has received injections in her knees recently.     REVIEW OF SYSTEMS:   Constitutional: Denies fevers, chills or abnormal weight loss (+) significant hot flashes, manageable Eyes: Denies blurriness of vision Ears, nose, mouth, throat, and face: Denies mucositis or sore throat Respiratory: Denies cough, dyspnea or wheezes Cardiovascular: Denies palpitation, chest discomfort or lower extremity swelling Gastrointestinal:   Denies nausea, heartburn or change in bowel habits Skin: Denies abnormal skin rashes MSK: (+) Arthritis in b/l knees  Lymphatics: Denies new lymphadenopathy or easy bruising Neurological:Denies numbness, tingling or new weaknesses Behavioral/Psych: Mood is stable, no new changes  All other systems were reviewed with the patient and are negative.  MEDICAL HISTORY:  Past Medical History:  Diagnosis Date  . Arthritis    knee   . Breast cancer, right ([facility]2018  . Complication of anesthesia    hard to wake up post-op; itching after anesthesia  . GERD (gastroesophageal reflux disease)   . History of vertigo   . Hypertension    states under control with med., has been on med. > 10 yr.  . Mitral valve prolapse   . Panic attacks   . Personal history of radiation therapy   . Wears partial dentures    upper    SURGICAL HISTORY: Past Surgical History:  Procedure Laterality Date  . ANTERIOR CERVICAL DECOMP/DISCECTOMY FUSION N/A 05/24/2013   Procedure: CERVICAL FOUR-FIVE, CERVICAL FIVE-SIX, CERVICAL SIX-SEVEN ANTERIOR CERVICAL DECOMPRESSION/DISCECTOMY FUSION 3 LEVELS;  Surgeon: RaFaythe GheeMD;  [facility];  Service: Neurosurgery;  Laterality: N/A;  . ANTERIOR CERVICAL DECOMP/DISCECTOMY FUSION  10/27/2016   C3-4  . BREAST LUMPECTOMY Right 2018  . BREAST REDUCTION SURGERY Bilateral 05/26/2017   Procedure: LEFT BREAST MAMMARY REDUCTION  (BREAST) AND RIGHT ONCOPLASTIC RECONSTRUCTION;  Surgeon: ThIrene LimboMD;  [facility] Service: Plastics;  Laterality: Bilateral;  . CESAREAN SECTION     x 2  . DILATION AND CURETTAGE OF UTERUS  05/30/2008  . ENDOMETRIAL ABLATION  05/30/2008  . RADIOACTIVE SEED GUIDED PARTIAL MASTECTOMY WITH AXILLARY SENTINEL LYMPH NODE BIOPSY Right 05/11/2017   Procedure: RIGHT BRACKETED RADIOACTIVE SEED GUIDED PARTIAL MASTECTOMY WITH  SENTINEL LYMPH NODE BIOPSY WITH ERAS PATHWAY;  Surgeon: [name], MD;  Location:  [facility];  Service: General;  Laterality: Right;  PEC BLOCK  . REDUCTION MAMMAPLASTY Bilateral   . TUBAL LIGATION      I have reviewed the social history and family history with the patient and they are unchanged from previous note.  ALLERGIES:  has No Known Allergies.  MEDICATIONS:  Current Outpatient Medications  Medication Sig Dispense Refill  . anastrozole (ARIMIDEX) 1 MG tablet Take 1 tablet (1 mg total) by mouth daily. 90 tablet 3  . celecoxib (CELEBREX) 200 MG capsule Take 200 mg by mouth daily.     . clonazePAM (KLONOPIN) 1 MG tablet Take 0.5 mg by mouth as needed for anxiety.    . diazepam (VALIUM) 2 MG tablet TAKE 2MG BY MOUTH DAILY AS NEEDED    . fexofenadine-pseudoephedrine (ALLEGRA-D 24) 180-240 MG per 24 hr tablet Take 1 tablet by mouth daily as needed (for sinus).    . gabapentin (NEURONTIN) 600 MG tablet Take 1 tablet (600 mg total) by mouth 3 (three) times daily. 100 tablet 2  . hydrochlorothiazide (HYDRODIURIL) 25 MG tablet Take 25 mg by mouth daily.    Marland Kitchen HYDROcodone-acetaminophen (NORCO) 5-325 MG tablet Take 1-2 tablets by mouth every 4 (four) hours as needed for moderate pain. 30 tablet 0  . pantoprazole (PROTONIX) 40 MG tablet Take 40 mg daily by mouth.  2  . venlafaxine XR (EFFEXOR-XR) 150 MG 24 hr capsule Take 1 capsule (150 mg total) by mouth daily with breakfast. 90 capsule 2   No current facility-administered medications for this visit.     PHYSICAL EXAMINATION: ECOG PERFORMANCE STATUS: 1 - Symptomatic but completely ambulatory  No vitals taken today, Exam not performed today  LABORATORY DATA:  I have reviewed the data as listed CBC Latest Ref Rng & Units 04/16/2018 01/15/2018 05/27/2017  WBC 4.0 - 10.5 K/uL 6.0 5.8 11.5(H)  Hemoglobin 12.0 - 15.0 g/dL 12.8 12.0 9.1(L)  Hematocrit 36.0 - 46.0 % 41.3 37.7 28.9(L)  Platelets 150 - 400 K/uL 238 208 254     CMP Latest Ref Rng & Units 04/16/2018 01/15/2018 05/19/2017  Glucose 70 - 99 mg/dL 88  97 84  BUN 6 - 20 mg/dL _0 Creatinine 0.44 - 1.00 mg/dL 0.83 0.77 0.63  Sodium 135 - 145 mmol/L 142 140 137  Potassium 3.5 - 5.1 mmol/L 4.0 4.1 3.7  Chloride 98 - 111 mmol/L 104 103 102  CO2 22 - 32 mmol/L _1 Calcium 8.9 - 10.3 mg/dL 10.2 9.8 9.1  Total Protein 6.5 - 8.1 g/dL 7.4 7.3 -  Total Bilirubin 0.3 - 1.2 mg/dL 0.2(L) 0.4 -  Alkaline Phos 38 - 126 U/L 66 66 -  AST 15 - 41 U/L 16 19 -  ALT 0 - 44 U/L 15 21 -      RADIOGRAPHIC STUDIES: I have personally reviewed the radiological images as listed and agreed with the findings in the report. No results found.   ASSESSMENT & PLAN:  Felicia Bauer is a 58 y.o. female with   1. Malignant neoplasm of upper-outer quadrant of right breast, invasive ductal carcinoma, stage IA, pT2N0M0, ER+/PR +, HER2 -, Grade I, and DCIS ER+/PR+, RS 12 -She  was diagnosed in 03/2017. She is s/p right breast lumpectomy with SLNB, breast reconstruction and adjuvant radiation.  -the Oncotype Dx result was low risk based on the recurrence score, which predicts 9 year distant recurrence after 5 years of tamoxifen 3%.  No benefit of adjuvant chemotherapy.  -She started anti-estrogen therapy with anastrozole in 08/2017. Tolerating well with manageable hot flashes.  -She is clinically doing well and stable. Her 03/2018 Mammogram showed calcification. A biopsy was done and showed fibrocystic changes, no evidence of malignancy. She will call breast cancer to see if she needs a 52-monthf/u mammogram, otherwise she will proceed with mammogram in 03/2018. There is no clinical concern for recurrence.  -Continue surveillance. Continue Anastrozole.  -F/u in 4 months   2. Hypertension, GERD, anxiety -Follow-up with primary care physician  3. Hot flash -on Effexor 150 mg daily due to persistent severe hot flash. I refilled her Effexor today (10/15/18) -She is also on Neurontin for her back pain, which helps her hot flash, overall manageable and stable.    4. Bone health -04/2018 DEXA shows normal bone health with lowest T-score of -0.1 at right hip.  -We previously discussed the potential adverse impact of anastrozole for her bone density.  I encouraged her to take calcium and vitamin D.  PLAN:  -I refilled her Anastrozole and Effexor today  -Continue anastrozole  -Lab and f/u in 4 months    No problem-specific Assessment & Plan notes found for this encounter.   No orders of the defined types were placed in this encounter.  I discussed the assessment and treatment plan with the patient. The patient was provided an opportunity to ask questions and all were answered. The patient agreed with the plan and demonstrated an understanding of the instructions.  The patient was advised to call back or seek an in-person evaluation if the symptoms worsen or if the condition fails to improve as anticipated.  I provided 10 minutes of non face-to-face telephone visit time during this encounter, and > 50% was spent counseling as documented under my assessment & plan.    YTruitt Merle MD 10/15/2018   I, AJoslyn Devon am acting as scribe for YTruitt Merle MD.   I have reviewed the above documentation for accuracy and completeness, and I agree with the above.

## 2018-10-15 ENCOUNTER — Inpatient Hospital Stay: Payer: 59 | Attending: Hematology | Admitting: Hematology

## 2018-10-15 ENCOUNTER — Telehealth: Payer: Self-pay | Admitting: Hematology

## 2018-10-15 ENCOUNTER — Other Ambulatory Visit: Payer: 59

## 2018-10-15 ENCOUNTER — Encounter: Payer: Self-pay | Admitting: Hematology

## 2018-10-15 DIAGNOSIS — C50411 Malignant neoplasm of upper-outer quadrant of right female breast: Secondary | ICD-10-CM | POA: Diagnosis not present

## 2018-10-15 DIAGNOSIS — Z923 Personal history of irradiation: Secondary | ICD-10-CM

## 2018-10-15 DIAGNOSIS — Z170 Estrogen receptor positive status [ER+]: Secondary | ICD-10-CM

## 2018-10-15 MED ORDER — VENLAFAXINE HCL ER 150 MG PO CP24: 150.0000 mg | ORAL_CAPSULE | Freq: Every day | ORAL | 2 refills | Status: DC

## 2018-10-15 MED ORDER — ANASTROZOLE 1 MG PO TABS: 1.0000 mg | ORAL_TABLET | Freq: Every day | ORAL | 3 refills | Status: DC

## 2018-10-15 NOTE — Telephone Encounter (Signed)
Scheduled appt per 5/4 los  A calendar will be mailed out.

## 2018-10-26 DIAGNOSIS — Z981 Arthrodesis status: Secondary | ICD-10-CM | POA: Diagnosis not present

## 2018-10-26 DIAGNOSIS — G8928 Other chronic postprocedural pain: Secondary | ICD-10-CM | POA: Diagnosis not present

## 2018-10-26 DIAGNOSIS — M4712 Other spondylosis with myelopathy, cervical region: Secondary | ICD-10-CM | POA: Diagnosis not present

## 2018-12-26 ENCOUNTER — Telehealth: Payer: Self-pay

## 2018-12-26 NOTE — Telephone Encounter (Signed)
I called her back, she does have finger stiffness and joint pain, which improves through the day. She also reports right had numbness and intermittent tightness and swelling in her hands. She saw her PCP and work up was negative. I told her this could be related to anastrozole, and recommend her to stop it for now, she will call me back in 2-4 weeks if her symptoms improve, and I will likely switch her to Exemestane or Tamoxifen.  Truitt Merle MD

## 2018-12-26 NOTE — Telephone Encounter (Signed)
Patient calls with complaint of having problem with swelling and feet and arms and fingers tingling.  Would like advice on what she should do?  [phone]

## 2019-02-11 NOTE — Progress Notes (Signed)
[facility]   [phone] [phone]) [phone]   Clinic Follow up Note   Patient Care Team: Orpah Melter, MD as PCP - General (Family Medicine) Truitt Merle, MD as Consulting Physician (Hematology) [name], MD as Consulting Physician (General Surgery) Kyung Rudd, MD as Consulting Physician (Radiation Oncology) Irene Limbo, MD as Consulting Physician (Plastic Surgery) Gardenia Phlegm, NP as Nurse Practitioner (Hematology and Oncology)  Date of Service:  02/15/2019  CHIEF COMPLAINT: F/u of right breast cancer   SUMMARY OF ONCOLOGIC HISTORY: Oncology History Overview Note  Cancer Staging Malignant neoplasm of upper-outer quadrant of right breast, estrogen receptor positive ([place]) Staging form: Breast, AJCC 8th Edition - Clinical stage from 04/12/2017: Stage IIA (cT3, cN0, cM0, G1, ER: Positive, PR: Positive, HER2: Negative) - Signed by Truitt Merle, MD on 04/16/2017 - Pathologic: Stage IA (pT2, pN0(sn), cM0, G1, ER: Positive, PR: Positive, HER2: Negative, Oncotype DX score: 12) - Unsigned     Malignant neoplasm of upper-outer quadrant of right breast, estrogen receptor positive ([place])  04/07/2017 Mammogram   IMPRESSION: 1. Highly suspicious mass within the upper right breast, 12 to 12:30 o'clock axis, at posterior depth, with associated architectural distortion and pleomorphic calcifications. The mass measures 1 cm greatest dimension. The mass and associated microcalcifications measure 1.4 cm. No sonographic correlate identified. Recommend stereotactic biopsy using 3D tomosynthesis guidance. 2. Additional punctate and coarse heterogeneous calcifications within the upper outer quadrant of the right breast, 11-12 o'clock axis region, at middle depth, with a suspicious segmental distribution, spanning approximately 5 cm, the most suspicious calcifications best seen on the CC magnification views. Recommend additional stereotactic biopsy for  these right breast calcifications to exclude multifocal/multicentric disease and/or define extent of disease.   04/12/2017 Initial Biopsy   Diagnosis 1. 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 - SEE COMMENT. 2. Breast, right, needle core biopsy, loosely grouped calcs in more central, upper outer quadrant - DUCTAL CARCINOMA IN SITU WITH CALCIFICATIONS. - LOBULAR NEOPLASIA (ATYPICAL LOBULAR HYPERPLASIA). - FIBROCYSTIC CHANGES WITH ADENOSIS AND CALCIFICATIONS.   04/12/2017 Receptors her2   Estrogen Receptor: 95%, POSITIVE, STRONG STAINING INTENSITY Progesterone Receptor: 95%, POSITIVE, STRONG STAINING INTENSITY Proliferation Marker Ki67: 3% HER2: NEGATIVE   04/16/2017 Initial Diagnosis   Malignant neoplasm of upper-outer quadrant of right breast, estrogen receptor positive ([place])   04/21/2017 Mammogram   04/21/2017 Mammogram IMPRESSION: 1. 2.8 x 2.0 x 0.5 cm biopsy-proven invasive ductal carcinoma and ductal carcinoma in situ in the posterior aspect of the upper-outer quadrant of the right breast. 2. No mass or abnormal enhancement at the location of the recently biopsied low-grade ductal carcinoma in situ with calcifications and atypical lobular hyperplasia in the central aspect of the upper-outer quadrant of the right breast. 3. No evidence of malignancy on the left.   05/11/2017 Surgery   Right breast lumpectomy and SLN biopsy    05/11/2017 Pathology Results   Right lumpectomy showed a 2.1cm invasive ductal carcinoma, margins are negative, (+)ADH, 6 nodes are all negative    05/11/2017 Oncotype testing   RS 12, predicts 9-year distant recurrence 3% with tamoxifen or AI    05/26/2017 Surgery   Breast reduction by Dr. Iran Planas    05/26/2017 Pathology Results   Diagnosis 1. Breast, Mammoplasty, left - FIBROCYSTIC CHANGES WITH ADENOSIS AND  CALCIFICATIONS. - THERE IS NO EVIDENCE OF MALIGNANCY. 2. Breast, Mammoplasty, right - FIBROCYSTIC CHANGES WITH ADENOSIS  AND CALCIFICATIONS. - FAT NECROSIS. - THERE IS NO EVIDENCE OF MALIGNANCY.   07/06/2017 - 08/18/2017 Radiation Therapy   Adjuvant breast radiation with Dr. Lisbeth Renshaw   08/2017 -  Anti-estrogen oral therapy   Anastrozole '1mg'$  daily   04/02/2018 Mammogram   Mammogram 04/02/18 There are calcifications near the lateral right lumpectomy site best seen on the CC view. There are also calcifications located behind the nipple on the CC view, likely superiorly located on the 90 degree view near the site of the second lumpectomy. There are also new calcifications located in the posterior slightly medial left breast, best seen on the CC view.   Mammographic images were processed with CAD.   04/05/2018 Pathology Results   Diagnosis 04/05/18 1. Breast, left, needle core biopsy, upper inner - FIBROCYSTIC CHANGES AND ADENOSIS WITH CALCIFICATIONS - NO MALIGNANCY IDENTIFIED 2. Breast, right, needle core biopsy, upper central - FIBROCYSTIC CHANGES, ADENOSIS AND FAT NECROSIS WITH CALCIFICATIONS - NO MALIGNANCY IDENTIFIED 3. Breast, right, needle core biopsy, upper outer - FIBROCYSTIC CHANGES, ADENOSIS AND FAT NECROSIS WITH CALCIFICATIONS - NO MALIGNANCY IDENTIFIED      CURRENT THERAPY:  Adjuvant Anastrozole '1mg'$  daily starting 08/2017. Stopped on 12/26/18 due to joint pain of hands.  INTERVAL HISTORY:  Felicia Bauer is here for a follow up of right breast cancer. She presents to the clinic alone. She had stopped anastrozole in 12/26/18 due to significant joint pain in her fingers. Since she stopped this improved, but still present. She plans to see neurologist.    REVIEW OF SYSTEMS:   Constitutional: Denies fevers, chills or abnormal weight loss Eyes: Denies blurriness of vision Ears, nose, mouth, throat, and face: Denies mucositis or sore throat Respiratory: Denies cough,  dyspnea or wheezes Cardiovascular: Denies palpitation, chest discomfort (+) mild lower extremity swelling Gastrointestinal: Denies nausea, heartburn or change in bowel habits Skin: Denies abnormal skin rashes MSK: (+) Joint pain in fingers  Lymphatics: Denies new lymphadenopathy or easy bruising Neurological: (+) Neuropathy of fingers Behavioral/Psych: Mood is stable, no new changes  All other systems were reviewed with the patient and are negative.  MEDICAL HISTORY:  Past Medical History:  Diagnosis Date  . Arthritis    knee   . Breast cancer, right ([place]) 04/2017  . Complication of anesthesia    hard to wake up post-op; itching after anesthesia  . GERD (gastroesophageal reflux disease)   . History of vertigo   . Hypertension    states under control with med., has been on med. > 10 yr.  . Mitral valve prolapse   . Panic attacks   . Personal history of radiation therapy   . Wears partial dentures    upper    SURGICAL HISTORY: Past Surgical History:  Procedure Laterality Date  . ANTERIOR CERVICAL DECOMP/DISCECTOMY FUSION N/A 05/24/2013   Procedure: CERVICAL FOUR-FIVE, CERVICAL FIVE-SIX, CERVICAL SIX-SEVEN ANTERIOR CERVICAL DECOMPRESSION/DISCECTOMY FUSION 3 LEVELS;  Surgeon: Faythe Ghee, MD;  Location: [facility];  Service: Neurosurgery;  Laterality: N/A;  . ANTERIOR CERVICAL DECOMP/DISCECTOMY FUSION  10/27/2016   C3-4  . BREAST LUMPECTOMY Right 2018  . BREAST REDUCTION SURGERY Bilateral 05/26/2017   Procedure: LEFT BREAST MAMMARY REDUCTION  (BREAST) AND RIGHT ONCOPLASTIC RECONSTRUCTION;  Surgeon: Irene Limbo, MD;  Location: [facility];  Service: Plastics;  Laterality: Bilateral;  . CESAREAN SECTION     x 2  . DILATION AND CURETTAGE OF UTERUS  05/30/2008  . ENDOMETRIAL ABLATION  05/30/2008  . RADIOACTIVE SEED GUIDED PARTIAL MASTECTOMY WITH  AXILLARY SENTINEL LYMPH NODE BIOPSY Right 05/11/2017   Procedure: RIGHT BRACKETED RADIOACTIVE SEED GUIDED  PARTIAL MASTECTOMY WITH  SENTINEL LYMPH NODE BIOPSY WITH ERAS PATHWAY;  Surgeon: [name], MD;  Location: [facility];  Service: General;  Laterality: Right;  PEC BLOCK  . REDUCTION MAMMAPLASTY Bilateral   . TUBAL LIGATION      I have reviewed the social history and family history with the patient and they are unchanged from previous note.  ALLERGIES:  has No Known Allergies.  MEDICATIONS:  Current Outpatient Medications  Medication Sig Dispense Refill  . anastrozole (ARIMIDEX) 1 MG tablet Take 1 tablet (1 mg total) by mouth daily. 90 tablet 3  . celecoxib (CELEBREX) 200 MG capsule Take 200 mg by mouth daily.     . clonazePAM (KLONOPIN) 1 MG tablet Take 0.5 mg by mouth as needed for anxiety.    . diazepam (VALIUM) 2 MG tablet TAKE '2MG'$  BY MOUTH DAILY AS NEEDED    . fexofenadine-pseudoephedrine (ALLEGRA-D 24) 180-240 MG per 24 hr tablet Take 1 tablet by mouth daily as needed (for sinus).    . gabapentin (NEURONTIN) 600 MG tablet Take 1 tablet (600 mg total) by mouth 3 (three) times daily. 100 tablet 2  . hydrochlorothiazide (HYDRODIURIL) 25 MG tablet Take 25 mg by mouth daily.    Marland Kitchen HYDROcodone-acetaminophen (NORCO) 5-325 MG tablet Take 1-2 tablets by mouth every 4 (four) hours as needed for moderate pain. 30 tablet 0  . pantoprazole (PROTONIX) 40 MG tablet Take 40 mg daily by mouth.  2  . venlafaxine XR (EFFEXOR-XR) 150 MG 24 hr capsule Take 1 capsule (150 mg total) by mouth daily with breakfast. 90 capsule 2   No current facility-administered medications for this visit.     PHYSICAL EXAMINATION: ECOG PERFORMANCE STATUS: 1 - Symptomatic but completely ambulatory  Vitals:   02/15/19 1023  BP: (!) 142/82  Pulse: 100  Resp: 18  Temp: 98 F (36.7 C)  SpO2: 100%   Filed Weights   02/15/19 1023  Weight: 199 lb 12.8 oz (90.6 kg)    GENERAL:alert, no distress and comfortable SKIN: skin color, texture, turgor are normal, no rashes or significant lesions EYES:  normal, Conjunctiva are pink and non-injected, sclera clear  NECK: supple, thyroid normal size, non-tender, without nodularity LYMPH:  no palpable lymphadenopathy in the cervical, axillary  LUNGS: clear to auscultation and percussion with normal breathing effort HEART: regular rate & rhythm and no murmurs (+) Mild lower extremity edema ABDOMEN:abdomen soft, non-tender and normal bowel sounds Musculoskeletal:no cyanosis of digits and no clubbing  NEURO: alert & oriented x 3 with fluent speech, no focal motor/sensory deficits BREAST: S/p right lumpectomy: Surgical incision healed well with mild scar tissue. No palpable mass, nodules or adenopathy bilaterally. Breast exam benign.   LABORATORY DATA:  I have reviewed the data as listed CBC Latest Ref Rng & Units 02/15/2019 04/16/2018 01/15/2018  WBC 4.0 - 10.5 K/uL 5.4 6.0 5.8  Hemoglobin 12.0 - 15.0 g/dL 12.3 12.8 12.0  Hematocrit 36.0 - 46.0 % 39.3 41.3 37.7  Platelets 150 - 400 K/uL 222 238 208     CMP Latest Ref Rng & Units 02/15/2019 04/16/2018 01/15/2018  Glucose 70 - 99 mg/dL 111(H) 88 97  BUN 6 - 20 mg/dL '17 13 18  '$ Creatinine 0.44 - 1.00 mg/dL 0.71 0.83 0.77  Sodium 135 - 145 mmol/L 140 142 140  Potassium 3.5 - 5.1 mmol/L 3.6 4.0 4.1  Chloride 98 - 111 mmol/L 104  104 103  CO2 22 - 32 mmol/L '27 29 26  '$ Calcium 8.9 - 10.3 mg/dL 9.4 10.2 9.8  Total Protein 6.5 - 8.1 g/dL 6.9 7.4 7.3  Total Bilirubin 0.3 - 1.2 mg/dL 0.3 0.2(L) 0.4  Alkaline Phos 38 - 126 U/L 69 66 66  AST 15 - 41 U/L '18 16 19  '$ ALT 0 - 44 U/L '16 15 21      '$ RADIOGRAPHIC STUDIES: I have personally reviewed the radiological images as listed and agreed with the findings in the report. No results found.   ASSESSMENT & PLAN:  SWARA DONZE is a 58 y.o. female with    1. Malignant neoplasm of upper-outer quadrant of right breast, invasive ductal carcinoma, stage IA, pT2N0M0, ER+/PR +, HER2 -, Grade I, and DCIS ER+/PR+, RS 12 -She was diagnosed in 03/2017. She is s/p  right breast lumpectomy with SLNB, breast reconstruction and adjuvant radiation.  -the Oncotype Dx result was low risk based on the recurrence score, which predicts 9 year distant recurrence after 5 years of tamoxifen 3%. No benefit of adjuvant chemotherapy.  -She started anti-estrogen therapy with anastrozole in 08/2017. She developed significant joint pain and neuropathy in her fingers. She stopped Anastrozole in 12/26/18 and this only improved some. She plans to see neurologist about this.  -Will not restart or switch her to another AI until after she see neurologist.  -From a breast cancer standpoint she is clinically doing well. Lab reviewed, her CBC and CMP are within normal limits except BG 111. Her physical exam was unremarkable. There is no clinical concern for recurrence. -For her mild ankle swelling I recommend she elevate her legs at home and wear tighter calf high socks.  -Continue surveillance. Next mammogram in 03/2019.  -F/u in 3-4 months   2. Joint pain and neuropathy of hands -She stopped anastrozole in 12/2018 and this only mildly improved.  -She will see neurologist for work up soon.   3. Hypertension, GERD, anxiety -Follow-up with primary care physician  4. Hot flash  -on Effexor 150 mg daily due to persistent severe hot flash. I refilled her Effexor today (10/15/18) -She is also on Neurontin for her back pain, which helps her hot flash, overall manageable and stable.  -She has recently stopped Anastrozole in 12/2018 and her hot flashes much improved. I told her that she can wean her off Effexor. If she does, will call in '75mg'$  and continue to wean off from there.    5. Bone health -04/2018 DEXA shows normal bone health with lowest T-score of -0.1 at right hip.  -We previously discussed the potential adverse impact of anastrozole for her bone density. I encouraged her to take calcium and vitamin D.  PLAN: -continue holding off anastrozole for now, pending neurology  evaluation  -Lab and f/u in 3-4 months, to discuss switching her to other antiestrogen therapy   No problem-specific Assessment & Plan notes found for this encounter.   Orders Placed This Encounter  Procedures  . MM DIAG BREAST TOMO BILATERAL    Standing Status:   Future    Standing Expiration Date:   02/15/2020    Order Specific Question:   Reason for Exam (SYMPTOM  OR DIAGNOSIS REQUIRED)    Answer:   screening    Order Specific Question:   Is the patient pregnant?    Answer:   No    Order Specific Question:   Preferred imaging location?    Answer:   [facility]  All questions were answered. The patient knows to call the clinic with any problems, questions or concerns. No barriers to learning was detected. I spent 15 minutes counseling the patient face to face. The total time spent in the appointment was 20 minutes and more than 50% was on counseling and review of test results     Truitt Merle, MD 02/15/2019   I, Joslyn Devon, am acting as scribe for Truitt Merle, MD.   I have reviewed the above documentation for accuracy and completeness, and I agree with the above.

## 2019-02-12 ENCOUNTER — Telehealth: Payer: Self-pay | Admitting: Hematology

## 2019-02-12 NOTE — Telephone Encounter (Signed)
Unable to reach pt to reschedule appt per 8/28 sch message.

## 2019-02-15 ENCOUNTER — Encounter: Payer: Self-pay | Admitting: Hematology

## 2019-02-15 ENCOUNTER — Other Ambulatory Visit: Payer: Self-pay

## 2019-02-15 ENCOUNTER — Inpatient Hospital Stay (HOSPITAL_BASED_OUTPATIENT_CLINIC_OR_DEPARTMENT_OTHER): Payer: 59 | Admitting: Hematology

## 2019-02-15 ENCOUNTER — Telehealth: Payer: Self-pay | Admitting: Hematology

## 2019-02-15 ENCOUNTER — Inpatient Hospital Stay: Payer: 59 | Attending: Hematology

## 2019-02-15 VITALS — BP 142/82 | HR 100 | Temp 98.0°F | Resp 18 | Ht 61.0 in | Wt 199.8 lb

## 2019-02-15 DIAGNOSIS — F419 Anxiety disorder, unspecified: Secondary | ICD-10-CM | POA: Diagnosis not present

## 2019-02-15 DIAGNOSIS — M7989 Other specified soft tissue disorders: Secondary | ICD-10-CM | POA: Diagnosis not present

## 2019-02-15 DIAGNOSIS — Z79899 Other long term (current) drug therapy: Secondary | ICD-10-CM | POA: Diagnosis not present

## 2019-02-15 DIAGNOSIS — M1990 Unspecified osteoarthritis, unspecified site: Secondary | ICD-10-CM | POA: Diagnosis not present

## 2019-02-15 DIAGNOSIS — K219 Gastro-esophageal reflux disease without esophagitis: Secondary | ICD-10-CM | POA: Insufficient documentation

## 2019-02-15 DIAGNOSIS — G629 Polyneuropathy, unspecified: Secondary | ICD-10-CM | POA: Diagnosis not present

## 2019-02-15 DIAGNOSIS — M549 Dorsalgia, unspecified: Secondary | ICD-10-CM | POA: Insufficient documentation

## 2019-02-15 DIAGNOSIS — Z923 Personal history of irradiation: Secondary | ICD-10-CM | POA: Diagnosis not present

## 2019-02-15 DIAGNOSIS — M25549 Pain in joints of unspecified hand: Secondary | ICD-10-CM | POA: Insufficient documentation

## 2019-02-15 DIAGNOSIS — C50411 Malignant neoplasm of upper-outer quadrant of right female breast: Secondary | ICD-10-CM

## 2019-02-15 DIAGNOSIS — R232 Flushing: Secondary | ICD-10-CM | POA: Insufficient documentation

## 2019-02-15 DIAGNOSIS — Z170 Estrogen receptor positive status [ER+]: Secondary | ICD-10-CM

## 2019-02-15 DIAGNOSIS — M25473 Effusion, unspecified ankle: Secondary | ICD-10-CM | POA: Insufficient documentation

## 2019-02-15 DIAGNOSIS — Z79811 Long term (current) use of aromatase inhibitors: Secondary | ICD-10-CM | POA: Diagnosis not present

## 2019-02-15 DIAGNOSIS — I10 Essential (primary) hypertension: Secondary | ICD-10-CM | POA: Insufficient documentation

## 2019-02-15 LAB — CBC WITH DIFFERENTIAL (CANCER CENTER ONLY)
Abs Immature Granulocytes: 0.01 10*3/uL (ref 0.00–0.07)
Basophils Absolute: 0 10*3/uL (ref 0.0–0.1)
Basophils Relative: 1 %
Eosinophils Absolute: 0.1 10*3/uL (ref 0.0–0.5)
Eosinophils Relative: 2 %
HCT: 39.3 % (ref 36.0–46.0)
Hemoglobin: 12.3 g/dL (ref 12.0–15.0)
Immature Granulocytes: 0 %
Lymphocytes Relative: 33 %
Lymphs Abs: 1.8 10*3/uL (ref 0.7–4.0)
MCH: 27.7 pg (ref 26.0–34.0)
MCHC: 31.3 g/dL (ref 30.0–36.0)
MCV: 88.5 fL (ref 80.0–100.0)
Monocytes Absolute: 0.4 10*3/uL (ref 0.1–1.0)
Monocytes Relative: 7 %
Neutro Abs: 3.1 10*3/uL (ref 1.7–7.7)
Neutrophils Relative %: 57 %
Platelet Count: 222 10*3/uL (ref 150–400)
RBC: 4.44 MIL/uL (ref 3.87–5.11)
RDW: 13.2 % (ref 11.5–15.5)
WBC Count: 5.4 10*3/uL (ref 4.0–10.5)
nRBC: 0 % (ref 0.0–0.2)

## 2019-02-15 LAB — CMP (CANCER CENTER ONLY)
ALT: 16 U/L (ref 0–44)
AST: 18 U/L (ref 15–41)
Albumin: 4.2 g/dL (ref 3.5–5.0)
Alkaline Phosphatase: 69 U/L (ref 38–126)
Anion gap: 9 (ref 5–15)
BUN: 17 mg/dL (ref 6–20)
CO2: 27 mmol/L (ref 22–32)
Calcium: 9.4 mg/dL (ref 8.9–10.3)
Chloride: 104 mmol/L (ref 98–111)
Creatinine: 0.71 mg/dL (ref 0.44–1.00)
GFR, Est AFR Am: >60 mL/min (ref >60)
GFR, Estimated: >60 mL/min (ref >60)
Glucose, Bld: 111 mg/dL — ABNORMAL HIGH (ref 70–99)
Potassium: 3.6 mmol/L (ref 3.5–5.1)
Sodium: 140 mmol/L (ref 135–145)
Total Bilirubin: 0.3 mg/dL (ref 0.3–1.2)
Total Protein: 6.9 g/dL (ref 6.5–8.1)

## 2019-02-15 NOTE — Telephone Encounter (Signed)
Scheduled appt per 9/4 los.  Spoke with patient and she is aware of the appt date and time

## 2019-03-13 ENCOUNTER — Other Ambulatory Visit: Payer: Self-pay

## 2019-03-13 ENCOUNTER — Ambulatory Visit (INDEPENDENT_AMBULATORY_CARE_PROVIDER_SITE_OTHER): Payer: 59 | Admitting: Neurology

## 2019-03-13 ENCOUNTER — Encounter: Payer: Self-pay | Admitting: Neurology

## 2019-03-13 DIAGNOSIS — G959 Disease of spinal cord, unspecified: Secondary | ICD-10-CM

## 2019-03-13 DIAGNOSIS — R202 Paresthesia of skin: Secondary | ICD-10-CM | POA: Diagnosis not present

## 2019-03-13 NOTE — Progress Notes (Signed)
[facility]    Nerve / Sites Muscle Latency Ref. Amplitude Ref. Rel Amp Segments Distance Velocity Ref. Area    ms ms mV mV %  cm m/s m/s mVms  R Median - APB     Wrist APB 3.7 ?4.4 6.3 ?4.0 100 Wrist - APB 7   25.8     Upper arm APB 8.0  6.2  98 Upper arm - Wrist 23 54 ?49 25.8  L Median - APB     Wrist APB 3.4 ?4.4 5.4 ?4.0 100 Wrist - APB 7   23.8     Upper arm APB 7.3  5.2  96.8 Upper arm - Wrist 22 57 ?49 23.5  R Ulnar - ADM     Wrist ADM 2.8 ?3.3 10.2 ?6.0 100 Wrist - ADM 7   35.8     B.Elbow ADM 6.1  9.8  96 B.Elbow - Wrist 19 56 ?49 35.2     A.Elbow ADM 8.1  9.1  92.9 A.Elbow - B.Elbow 10 52 ?49 34.1         A.Elbow - Wrist      L Ulnar - ADM     Wrist ADM 2.6 ?3.3 9.4 ?6.0 100 Wrist - ADM 7   40.4     B.Elbow ADM 6.1  8.6  91.2 B.Elbow - Wrist 19 54 ?49 38.7     A.Elbow ADM 8.2  8.3  96.2 A.Elbow - B.Elbow 10 49 ?49 38.2         A.Elbow - Wrist                 SNC    Nerve / Sites Rec. Site Peak Lat Ref.  Amp Ref. Segments Distance    ms ms V V  cm  R Median - Orthodromic (Dig II, Mid palm)     Dig II Wrist 3.6 ?3.4 13 ?10 Dig II - Wrist 13  L Median - Orthodromic (Dig II, Mid palm)     Dig II Wrist 3.2 ?3.4 12 ?10 Dig II - Wrist 13  R Ulnar - Orthodromic, (Dig V, Mid palm)     Dig V Wrist 2.8 ?3.1 5 ?5 Dig V - Wrist 11  L Ulnar - Orthodromic, (Dig V, Mid palm)     Dig V Wrist 3.0 ?3.1 5 ?5 Dig V - Wrist 33              F  Wave    Nerve F Lat Ref.   ms ms  R Ulnar - ADM 27.3 ?32.0  L Ulnar - ADM 25.8 ?32.0

## 2019-03-13 NOTE — Procedures (Signed)
     HISTORY: Felicia Bauer is a 58 year old patient with a history of prior cervical myelopathy, status post decompressive surgery.  The patient has had worsening of paresthesias of all 4 extremities over the last 2 months, she is being evaluated for this change in her clinical condition.  NERVE CONDUCTION STUDIES:  Nerve conduction studies were performed on both upper extremities. The distal motor latencies and motor amplitudes for the median and ulnar nerves were within normal limits. The nerve conduction velocities for these nerves were also normal. The sensory latencies for the median and ulnar nerves were normal, with exception of a minimal prolongation for the right median sensory latency. The F wave latencies for the ulnar nerves were within normal limits.   EMG STUDIES:  EMG study was performed on the left upper extremity:  The first dorsal interosseous muscle reveals 2 to 4 K units with full recruitment. No fibrillations or positive waves were noted. The abductor pollicis brevis muscle reveals 2 to 4 K units with full recruitment. No fibrillations or positive waves were noted. The extensor indicis proprius muscle reveals 1 to 3 K units with full recruitment. No fibrillations or positive waves were noted. The pronator teres muscle reveals 2 to 3 K units with full recruitment. No fibrillations or positive waves were noted. The biceps muscle reveals 1 to 2 K units with full recruitment. No fibrillations or positive waves were noted. The triceps muscle reveals 2 to 4 K units with full recruitment. No fibrillations or positive waves were noted. The anterior deltoid muscle reveals 2 to 3 K units with full recruitment. No fibrillations or positive waves were noted. The cervical paraspinal muscles were tested at 2 levels. No abnormalities of insertional activity were seen at either level tested. There was good relaxation.   IMPRESSION:  Nerve conduction studies done on both upper  extremities were relatively unremarkable with exception of a minimally prolonged right median sensory latency.  EMG evaluation of the left upper extremity was unremarkable, no evidence of an overlying cervical radiculopathy was seen.  Jill Alexanders MD 03/13/2019 4:02 PM  [facility] [address] [place], [place] [address]  Phone [phone] Fax [phone]

## 2019-03-13 NOTE — Progress Notes (Signed)
Please refer to EMG and nerve conduction procedure note.  

## 2019-04-03 ENCOUNTER — Other Ambulatory Visit: Payer: Self-pay

## 2019-04-03 ENCOUNTER — Emergency Department (HOSPITAL_BASED_OUTPATIENT_CLINIC_OR_DEPARTMENT_OTHER)
Admission: EM | Admit: 2019-04-03 | Discharge: 2019-04-03 | Disposition: A | Discharge status: Home / Self Care | Payer: 59 | Attending: Emergency Medicine | Admitting: Emergency Medicine

## 2019-04-03 ENCOUNTER — Encounter (HOSPITAL_BASED_OUTPATIENT_CLINIC_OR_DEPARTMENT_OTHER): Payer: Self-pay | Admitting: Emergency Medicine

## 2019-04-03 DIAGNOSIS — Z79899 Other long term (current) drug therapy: Secondary | ICD-10-CM | POA: Diagnosis not present

## 2019-04-03 DIAGNOSIS — I10 Essential (primary) hypertension: Secondary | ICD-10-CM | POA: Diagnosis not present

## 2019-04-03 DIAGNOSIS — Y999 Unspecified external cause status: Secondary | ICD-10-CM | POA: Diagnosis not present

## 2019-04-03 DIAGNOSIS — Y93I9 Activity, other involving external motion: Secondary | ICD-10-CM | POA: Insufficient documentation

## 2019-04-03 DIAGNOSIS — M545 Low back pain, unspecified: Secondary | ICD-10-CM

## 2019-04-03 DIAGNOSIS — M25511 Pain in right shoulder: Secondary | ICD-10-CM | POA: Diagnosis not present

## 2019-04-03 DIAGNOSIS — Y92410 Unspecified street and highway as the place of occurrence of the external cause: Secondary | ICD-10-CM | POA: Diagnosis not present

## 2019-04-03 DIAGNOSIS — M25562 Pain in left knee: Secondary | ICD-10-CM | POA: Diagnosis not present

## 2019-04-03 MED ORDER — METHOCARBAMOL 500 MG PO TABS: 500.0000 mg | ORAL_TABLET | Freq: Every evening | ORAL | 0 refills | Status: DC | PRN

## 2019-04-03 MED ORDER — NAPROXEN 250 MG PO TABS
500.0000 mg | ORAL_TABLET | Freq: Once | ORAL | Status: AC
  Administered 2019-04-03: 500 mg via ORAL
  Filled 2019-04-03: qty 2

## 2019-04-03 MED ORDER — METHOCARBAMOL 500 MG PO TABS
500.0000 mg | ORAL_TABLET | Freq: Once | ORAL | Status: AC
  Administered 2019-04-03: 20:00:00 500 mg via ORAL
  Filled 2019-04-03: qty 1

## 2019-04-03 MED ORDER — NAPROXEN 500 MG PO TABS: 500.0000 mg | ORAL_TABLET | Freq: Two times a day (BID) | ORAL | 0 refills | Status: DC
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
## 2019-04-03 NOTE — Discharge Instructions (Addendum)
Take naproxen 2 times a day with meals.  Do not take other anti-inflammatories at the same time (Advil, Motrin, ibuprofen, Aleve). You may supplement with Tylenol if you need further pain control. Use robaxin as needed for muscle stiffness or soreness.  Have caution, this may make you tired or groggy.  Do not drive or operate heavy machinery while taking this medicine. Use ice packs or heating pads if this helps control your pain. You will likely have continued muscle stiffness and soreness over the next couple days.  Follow-up with primary care in 1 week if your symptoms are not improving. Return to the emergency room if you develop vision changes, vomiting, slurred speech, numbness, loss of bowel or bladder control, or any new or worsening symptoms.
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
## 2019-04-04 ENCOUNTER — Ambulatory Visit
Admission: RE | Admit: 2019-04-04 | Discharge: 2019-04-04 | Disposition: A | Discharge status: Home / Self Care | Payer: 59 | Source: Ambulatory Visit | Attending: Hematology | Admitting: Hematology

## 2019-04-04 ENCOUNTER — Other Ambulatory Visit: Payer: Self-pay | Admitting: Hematology

## 2019-04-04 DIAGNOSIS — Z170 Estrogen receptor positive status [ER+]: Secondary | ICD-10-CM

## 2019-04-04 DIAGNOSIS — C50411 Malignant neoplasm of upper-outer quadrant of right female breast: Secondary | ICD-10-CM

## 2019-05-20 ENCOUNTER — Telehealth: Payer: Self-pay | Admitting: Hematology

## 2019-05-20 NOTE — Telephone Encounter (Signed)
Called patient per providers request to convert follow-up visit to a virtual visit, patient is notified.

## 2019-05-23 NOTE — Progress Notes (Signed)
[name]   [phone] [phone]   Clinic Follow up Note   Patient Care Team: Orpah Melter, MD as PCP - General (Family Medicine) Truitt Merle, MD as Consulting Physician (Hematology) [name], MD as Consulting Physician (General Surgery) Kyung Rudd, MD as Consulting Physician (Radiation Oncology) Irene Limbo, MD as Consulting Physician (Plastic Surgery) Delice Bison, Charlestine Massed, NP as Nurse Practitioner (Hematology and Oncology) Kieth Brightly as Physician Assistant (Physician Assistant)   I connected with Lonia Farber on 05/27/2019 at 10:40 AM EST by telephone visit and verified that I am speaking with the correct person using two identifiers.  I discussed the limitations, risks, security and privacy concerns of performing an evaluation and management service by telephone and the availability of in person appointments. I also discussed with the patient that there may be a patient responsible charge related to this service. The patient expressed understanding and agreed to proceed.   Patient's location:  Her home  Provider's location:  My Office   CHIEF COMPLAINT: F/u of right breast cancer  SUMMARY OF ONCOLOGIC HISTORY: Oncology History Overview Note  Cancer Staging Malignant neoplasm of upper-outer quadrant of right breast, estrogen receptor positive ([facility]) Staging form: Breast, AJCC 8th Edition - Clinical stage from 04/12/2017: Stage IIA (cT3, cN0, cM0, G1, ER: Positive, PR: Positive, HER2: Negative) - Signed by Truitt Merle, MD on 04/16/2017 - Pathologic: Stage IA (pT2, pN0(sn), cM0, G1, ER: Positive, PR: Positive, HER2: Negative, Oncotype DX score: 12) - Unsigned     Malignant neoplasm of upper-outer quadrant of right breast, estrogen receptor positive ([place])  04/07/2017 Mammogram   IMPRESSION: 1. Highly suspicious mass within the upper right breast, 12 to 12:30 o'clock axis, at posterior depth, with  associated architectural distortion and pleomorphic calcifications. The mass measures 1 cm greatest dimension. The mass and associated microcalcifications measure 1.4 cm. No sonographic correlate identified. Recommend stereotactic biopsy using 3D tomosynthesis guidance. 2. Additional punctate and coarse heterogeneous calcifications within the upper outer quadrant of the right breast, 11-12 o'clock axis region, at middle depth, with a suspicious segmental distribution, spanning approximately 5 cm, the most suspicious calcifications best seen on the CC magnification views. Recommend additional stereotactic biopsy for these right breast calcifications to exclude multifocal/multicentric disease and/or define extent of disease.   04/12/2017 Initial Biopsy   Diagnosis 1. 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 - SEE COMMENT. 2. Breast, right, needle core biopsy, loosely grouped calcs in more central, upper outer quadrant - DUCTAL CARCINOMA IN SITU WITH CALCIFICATIONS. - LOBULAR NEOPLASIA (ATYPICAL LOBULAR HYPERPLASIA). - FIBROCYSTIC CHANGES WITH ADENOSIS AND CALCIFICATIONS.   04/12/2017 Receptors her2   Estrogen Receptor: 95%, POSITIVE, STRONG STAINING INTENSITY Progesterone Receptor: 95%, POSITIVE, STRONG STAINING INTENSITY Proliferation Marker Ki67: 3% HER2: NEGATIVE   04/16/2017 Initial Diagnosis   Malignant neoplasm of upper-outer quadrant of right breast, estrogen receptor positive ([place])   04/21/2017 Mammogram   04/21/2017 Mammogram IMPRESSION: 1. 2.8 x 2.0 x 0.5 cm biopsy-proven invasive ductal carcinoma and ductal carcinoma in situ in the posterior aspect of the upper-outer quadrant of the right breast. 2. No mass or abnormal enhancement at the location of the recently biopsied low-grade ductal carcinoma in situ with calcifications and atypical  lobular hyperplasia in the central aspect of the upper-outer quadrant of the right breast. 3. No evidence of malignancy on the left.   05/11/2017 Surgery  Right breast lumpectomy and SLN biopsy    05/11/2017 Pathology Results   Right lumpectomy showed a 2.1cm invasive ductal carcinoma, margins are negative, (+)ADH, 6 nodes are all negative    05/11/2017 Oncotype testing   RS 12, predicts 9-year distant recurrence 3% with tamoxifen or AI    05/26/2017 Surgery   Breast reduction by Dr. Iran Planas    05/26/2017 Pathology Results   Diagnosis 1. Breast, Mammoplasty, left - FIBROCYSTIC CHANGES WITH ADENOSIS AND CALCIFICATIONS. - THERE IS NO EVIDENCE OF MALIGNANCY. 2. Breast, Mammoplasty, right - FIBROCYSTIC CHANGES WITH ADENOSIS AND CALCIFICATIONS. - FAT NECROSIS. - THERE IS NO EVIDENCE OF MALIGNANCY.   07/06/2017 - 08/18/2017 Radiation Therapy   Adjuvant breast radiation with Dr. Lisbeth Renshaw   08/2017 -  Anti-estrogen oral therapy   Anastrozole '1mg'$  daily   04/02/2018 Mammogram   Mammogram 04/02/18 There are calcifications near the lateral right lumpectomy site best seen on the CC view. There are also calcifications located behind the nipple on the CC view, likely superiorly located on the 90 degree view near the site of the second lumpectomy. There are also new calcifications located in the posterior slightly medial left breast, best seen on the CC view.   Mammographic images were processed with CAD.   04/05/2018 Pathology Results   Diagnosis 04/05/18 1. Breast, left, needle core biopsy, upper inner - FIBROCYSTIC CHANGES AND ADENOSIS WITH CALCIFICATIONS - NO MALIGNANCY IDENTIFIED 2. Breast, right, needle core biopsy, upper central - FIBROCYSTIC CHANGES, ADENOSIS AND FAT NECROSIS WITH CALCIFICATIONS - NO MALIGNANCY IDENTIFIED 3. Breast, right, needle core biopsy, upper outer - FIBROCYSTIC CHANGES, ADENOSIS AND FAT NECROSIS WITH CALCIFICATIONS - NO MALIGNANCY IDENTIFIED       CURRENT THERAPY:  AdjuvantAnastrozole'1mg'$  daily starting 08/2017.  Held from 12/26/18-04/2019 due to significant joint pain in hands  INTERVAL HISTORY:  Felicia Bauer is here for a follow up of right breast cancer. She notes she is doing well. She notes she does not think her neurological symptoms are related to anastrozole so she started it 3 weeks ago. She plans to have surgery to her cervical spine on 05/31/19. She denies any issues from anastrozole and her hot flashes has improved.    REVIEW OF SYSTEMS:   Constitutional: Denies fevers, chills or abnormal weight loss (+) improved hot flashes  Eyes: Denies blurriness of vision Ears, nose, mouth, throat, and face: Denies mucositis or sore throat Respiratory: Denies cough, dyspnea or wheezes Cardiovascular: Denies palpitation, chest discomfort or lower extremity swelling Gastrointestinal:  Denies nausea, heartburn or change in bowel habits Skin: Denies abnormal skin rashes MSK: (+) hand joint pain  Lymphatics: Denies new lymphadenopathy or easy bruising Neurological:Denies numbness, tingling or new weaknesses Behavioral/Psych: Mood is stable, no new changes  All other systems were reviewed with the patient and are negative.  MEDICAL HISTORY:  Past Medical History:  Diagnosis Date  . Arthritis    knee   . Breast cancer, right ([place]) 04/2017  . Complication of anesthesia    hard to wake up post-op; itching after anesthesia  . GERD (gastroesophageal reflux disease)   . History of vertigo   . Hypertension    states under control with med., has been on med. > 10 yr.  . Mitral valve prolapse   . Panic attacks   . Personal history of radiation therapy   . Wears partial dentures    upper    SURGICAL HISTORY: Past Surgical History:  Procedure Laterality Date  . ANTERIOR CERVICAL DECOMP/DISCECTOMY FUSION N/A 05/24/2013  Procedure: CERVICAL FOUR-FIVE, CERVICAL FIVE-SIX, CERVICAL SIX-SEVEN ANTERIOR CERVICAL  DECOMPRESSION/DISCECTOMY FUSION 3 LEVELS;  Surgeon: Faythe Ghee, MD;  Location: [facility];  Service: Neurosurgery;  Laterality: N/A;  . ANTERIOR CERVICAL DECOMP/DISCECTOMY FUSION  10/27/2016   C3-4  . BREAST LUMPECTOMY Right 2018  . BREAST REDUCTION SURGERY Bilateral 05/26/2017   Procedure: LEFT BREAST MAMMARY REDUCTION  (BREAST) AND RIGHT ONCOPLASTIC RECONSTRUCTION;  Surgeon: Irene Limbo, MD;  Location: [facility];  Service: Plastics;  Laterality: Bilateral;  . CESAREAN SECTION     x 2  . DILATION AND CURETTAGE OF UTERUS  05/30/2008  . ENDOMETRIAL ABLATION  05/30/2008  . RADIOACTIVE SEED GUIDED PARTIAL MASTECTOMY WITH AXILLARY SENTINEL LYMPH NODE BIOPSY Right 05/11/2017   Procedure: RIGHT BRACKETED RADIOACTIVE SEED GUIDED PARTIAL MASTECTOMY WITH  SENTINEL LYMPH NODE BIOPSY WITH ERAS PATHWAY;  Surgeon: [name], MD;  Location: [facility];  Service: General;  Laterality: Right;  PEC BLOCK  . REDUCTION MAMMAPLASTY Bilateral   . TUBAL LIGATION      I have reviewed the social history and family history with the patient and they are unchanged from previous note.  ALLERGIES:  has No Known Allergies.  MEDICATIONS:  Current Outpatient Medications  Medication Sig Dispense Refill  . anastrozole (ARIMIDEX) 1 MG tablet Take 1 tablet (1 mg total) by mouth daily. 90 tablet 3  . celecoxib (CELEBREX) 200 MG capsule Take 200 mg by mouth daily.     . clonazePAM (KLONOPIN) 1 MG tablet Take 0.5 mg by mouth as needed for anxiety.    . diazepam (VALIUM) 2 MG tablet TAKE '2MG'$  BY MOUTH DAILY AS NEEDED    . fexofenadine-pseudoephedrine (ALLEGRA-D 24) 180-240 MG per 24 hr tablet Take 1 tablet by mouth daily as needed (for sinus).    . gabapentin (NEURONTIN) 600 MG tablet Take 1 tablet (600 mg total) by mouth 3 (three) times daily. 100 tablet 2  . hydrochlorothiazide (HYDRODIURIL) 25 MG tablet Take 25 mg by mouth daily.    Marland Kitchen HYDROcodone-acetaminophen (NORCO) 5-325 MG  tablet Take 1-2 tablets by mouth every 4 (four) hours as needed for moderate pain. 30 tablet 0  . methocarbamol (ROBAXIN) 500 MG tablet Take 1 tablet (500 mg total) by mouth at bedtime as needed for muscle spasms. 10 tablet 0  . naproxen (NAPROSYN) 500 MG tablet Take 1 tablet (500 mg total) by mouth 2 (two) times daily with a meal. 21 tablet 0  . pantoprazole (PROTONIX) 40 MG tablet Take 40 mg daily by mouth.  2  . venlafaxine XR (EFFEXOR-XR) 150 MG 24 hr capsule Take 1 capsule (150 mg total) by mouth daily with breakfast. 90 capsule 2   No current facility-administered medications for this visit.    PHYSICAL EXAMINATION: ECOG PERFORMANCE STATUS: 1 - Symptomatic but completely ambulatory  No vitals taken today, Exam not performed today   LABORATORY DATA:  I have reviewed the data as listed CBC Latest Ref Rng & Units 02/15/2019 04/16/2018 01/15/2018  WBC 4.0 - 10.5 K/uL 5.4 6.0 5.8  Hemoglobin 12.0 - 15.0 g/dL 12.3 12.8 12.0  Hematocrit 36.0 - 46.0 % 39.3 41.3 37.7  Platelets 150 - 400 K/uL 222 238 208     CMP Latest Ref Rng & Units 02/15/2019 04/16/2018 01/15/2018  Glucose 70 - 99 mg/dL 111(H) 88 97  BUN 6 - 20 mg/dL '17 13 18  '$ Creatinine 0.44 - 1.00 mg/dL 0.71 0.83 0.77  Sodium 135 - 145 mmol/L 140 142 140  Potassium 3.5 -  5.1 mmol/L 3.6 4.0 4.1  Chloride 98 - 111 mmol/L 104 104 103  CO2 22 - 32 mmol/L '27 29 26  '$ Calcium 8.9 - 10.3 mg/dL 9.4 10.2 9.8  Total Protein 6.5 - 8.1 g/dL 6.9 7.4 7.3  Total Bilirubin 0.3 - 1.2 mg/dL 0.3 0.2(L) 0.4  Alkaline Phos 38 - 126 U/L 69 66 66  AST 15 - 41 U/L '18 16 19  '$ ALT 0 - 44 U/L '16 15 21      '$ RADIOGRAPHIC STUDIES: I have personally reviewed the radiological images as listed and agreed with the findings in the report. No results found.   ASSESSMENT & PLAN:  Cythia Bachtel is a 58 y.o. female with   1. Malignant neoplasm of upper-outer quadrant of right breast, invasive ductal carcinoma, stage IA, pT2N0M0, ER+/PR +, HER2 -, Grade I,  and DCIS ER+/PR+, RS 12 -She was diagnosed in 03/2017. She is s/p right breast lumpectomy with SLNB, breast reconstruction and adjuvant radiation.RS 12 with no benefit of chemotherapy.  -She started anti-estrogen therapy with anastrozole in 08/2017. Held from 12/26/18-04/2019 due to significant cervical radiculopathy and joint pain in hands. She is tolerating well now with improving hot flashes.  -From a breast cancer standpoint, she is clinically doing well. There is no clinical concern for recurrence. -She will have a f/u mammogram in of left breast in 09/2019. Her routine mammogram in 03/2019.  -Continue Anastrozole.  -F/u in 4-5 months  2. Cervical radiculopathy, joint pain and neuropathy of hands -She stopped anastrozole in 12/2018 and this only mildly improved. She restarted anastrozole in 04/2019  -She plans to have cervical spine surgery on 05/31/19 to help her pain and neurological symptoms.   3. Hypertension, GERD, anxiety -Follow-up with primary care physician  4. Hot flash  -on Effexor150 mg daily due to persistent severe hot flash.  -She is also on Neurontin for her back pain, which helps her hot flash.  -Her hot flashes have improved recently.   5. Bone health -04/2018 DEXA shows normal bone health with lowest T-score of -0.1 at right hip. -Wepreviouslydiscussed the potential adverse impact of anastrozole for her bone density. I encouraged her to take calcium and vitamin D. Next DEXA in 04/2020  PLAN: -Continue anastrozole  -Lab and f/u in 4-5 months    No problem-specific Assessment & Plan notes found for this encounter.   No orders of the defined types were placed in this encounter.  I discussed the assessment and treatment plan with the patient. The patient was provided an opportunity to ask questions and all were answered. The patient agreed with the plan and demonstrated an understanding of the instructions.  The patient was advised to call back or seek  an in-person evaluation if the symptoms worsen or if the condition fails to improve as anticipated.  I provided 10 minutes of non face-to-face telephone visit time during this encounter, and > 50% was spent counseling as documented under my assessment & plan.    Truitt Merle, MD 05/27/2019   I, Joslyn Devon, am acting as scribe for Truitt Merle, MD.   I have reviewed the above documentation for accuracy and completeness, and I agree with the above.

## 2019-05-27 ENCOUNTER — Other Ambulatory Visit: Payer: 59

## 2019-05-27 ENCOUNTER — Inpatient Hospital Stay: Payer: 59 | Attending: Hematology | Admitting: Hematology

## 2019-05-27 ENCOUNTER — Telehealth: Payer: Self-pay | Admitting: Hematology

## 2019-05-27 ENCOUNTER — Encounter: Payer: Self-pay | Admitting: Hematology

## 2019-05-27 DIAGNOSIS — Z170 Estrogen receptor positive status [ER+]: Secondary | ICD-10-CM | POA: Diagnosis not present

## 2019-05-27 DIAGNOSIS — C50411 Malignant neoplasm of upper-outer quadrant of right female breast: Secondary | ICD-10-CM

## 2019-05-27 NOTE — Telephone Encounter (Signed)
Scheduled appt per 12/14 los.  Spoke with pt and she is aware of her appt date and time.

## 2019-08-12 ENCOUNTER — Other Ambulatory Visit: Payer: Self-pay | Admitting: Hematology

## 2019-09-25 ENCOUNTER — Encounter: Payer: Self-pay | Admitting: Hematology

## 2019-10-04 ENCOUNTER — Other Ambulatory Visit: Payer: Self-pay

## 2019-10-04 ENCOUNTER — Other Ambulatory Visit: Payer: Self-pay | Admitting: Hematology

## 2019-10-04 ENCOUNTER — Ambulatory Visit
Admission: RE | Admit: 2019-10-04 | Discharge: 2019-10-04 | Disposition: A | Discharge status: Home / Self Care | Payer: 59 | Source: Ambulatory Visit | Attending: Hematology | Admitting: Hematology

## 2019-10-04 DIAGNOSIS — C50411 Malignant neoplasm of upper-outer quadrant of right female breast: Secondary | ICD-10-CM

## 2019-10-04 DIAGNOSIS — R921 Mammographic calcification found on diagnostic imaging of breast: Secondary | ICD-10-CM

## 2019-10-04 DIAGNOSIS — Z170 Estrogen receptor positive status [ER+]: Secondary | ICD-10-CM

## 2019-10-09 NOTE — Progress Notes (Signed)
[place]   [phone] [phone]   Clinic Follow up Note   Patient Care Team: Orpah Melter, MD as PCP - General (Family Medicine) Truitt Merle, MD as Consulting Physician (Hematology) [name], MD as Consulting Physician (General Surgery) Kyung Rudd, MD as Consulting Physician (Radiation Oncology) Irene Limbo, MD as Consulting Physician (Plastic Surgery) Delice Bison, Charlestine Massed, NP as Nurse Practitioner (Hematology and Oncology) Aurea Graff, PA-C (Inactive) as Physician Assistant (Physician Assistant)  Date of Service:  10/09/2019  CHIEF COMPLAINT: F/u of right breast cancer  SUMMARY OF ONCOLOGIC HISTORY: Oncology History Overview Note  Cancer Staging Malignant neoplasm of upper-outer quadrant of right breast, estrogen receptor positive ([place]) Staging form: Breast, AJCC 8th Edition - Clinical stage from 04/12/2017: Stage IIA (cT3, cN0, cM0, G1, ER: Positive, PR: Positive, HER2: Negative) - Signed by Truitt Merle, MD on 04/16/2017 - Pathologic: Stage IA (pT2, pN0(sn), cM0, G1, ER: Positive, PR: Positive, HER2: Negative, Oncotype DX score: 12) - Unsigned     Malignant neoplasm of upper-outer quadrant of right breast, estrogen receptor positive ([place])  04/07/2017 Mammogram   IMPRESSION: 1. Highly suspicious mass within the upper right breast, 12 to 12:30 o'clock axis, at posterior depth, with associated architectural distortion and pleomorphic calcifications. The mass measures 1 cm greatest dimension. The mass and associated microcalcifications measure 1.4 cm. No sonographic correlate identified. Recommend stereotactic biopsy using 3D tomosynthesis guidance. 2. Additional punctate and coarse heterogeneous calcifications within the upper outer quadrant of the right breast, 11-12 o'clock axis region, at middle depth, with a suspicious segmental distribution, spanning approximately 5 cm, the most suspicious calcifications best  seen on the CC magnification views. Recommend additional stereotactic biopsy for these right breast calcifications to exclude multifocal/multicentric disease and/or define extent of disease.   04/12/2017 Initial Biopsy   Diagnosis 1. 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 - SEE COMMENT. 2. Breast, right, needle core biopsy, loosely grouped calcs in more central, upper outer quadrant - DUCTAL CARCINOMA IN SITU WITH CALCIFICATIONS. - LOBULAR NEOPLASIA (ATYPICAL LOBULAR HYPERPLASIA). - FIBROCYSTIC CHANGES WITH ADENOSIS AND CALCIFICATIONS.   04/12/2017 Receptors her2   Estrogen Receptor: 95%, POSITIVE, STRONG STAINING INTENSITY Progesterone Receptor: 95%, POSITIVE, STRONG STAINING INTENSITY Proliferation Marker Ki67: 3% HER2: NEGATIVE   04/16/2017 Initial Diagnosis   Malignant neoplasm of upper-outer quadrant of right breast, estrogen receptor positive ([place])   04/21/2017 Mammogram   04/21/2017 Mammogram IMPRESSION: 1. 2.8 x 2.0 x 0.5 cm biopsy-proven invasive ductal carcinoma and ductal carcinoma in situ in the posterior aspect of the upper-outer quadrant of the right breast. 2. No mass or abnormal enhancement at the location of the recently biopsied low-grade ductal carcinoma in situ with calcifications and atypical lobular hyperplasia in the central aspect of the upper-outer quadrant of the right breast. 3. No evidence of malignancy on the left.   05/11/2017 Surgery   Right breast lumpectomy and SLN biopsy    05/11/2017 Pathology Results   Right lumpectomy showed a 2.1cm invasive ductal carcinoma, margins are negative, (+)ADH, 6 nodes are all negative    05/11/2017 Oncotype testing   RS 12, predicts 9-year distant recurrence 3% with tamoxifen or AI    05/26/2017 Surgery   Breast reduction by Dr. Iran Planas    05/26/2017 Pathology Results    Diagnosis 1. Breast, Mammoplasty, left - FIBROCYSTIC CHANGES WITH ADENOSIS AND CALCIFICATIONS. - THERE IS NO EVIDENCE OF MALIGNANCY.  2. Breast, Mammoplasty, right - FIBROCYSTIC CHANGES WITH ADENOSIS AND CALCIFICATIONS. - FAT NECROSIS. - THERE IS NO EVIDENCE OF MALIGNANCY.   07/06/2017 - 08/18/2017 Radiation Therapy   Adjuvant breast radiation with Dr. Lisbeth Renshaw   08/2017 -  Anti-estrogen oral therapy   Anastrozole '1mg'$  daily   04/02/2018 Mammogram   Mammogram 04/02/18 There are calcifications near the lateral right lumpectomy site best seen on the CC view. There are also calcifications located behind the nipple on the CC view, likely superiorly located on the 90 degree view near the site of the second lumpectomy. There are also new calcifications located in the posterior slightly medial left breast, best seen on the CC view.   Mammographic images were processed with CAD.   04/05/2018 Pathology Results   Diagnosis 04/05/18 1. Breast, left, needle core biopsy, upper inner - FIBROCYSTIC CHANGES AND ADENOSIS WITH CALCIFICATIONS - NO MALIGNANCY IDENTIFIED 2. Breast, right, needle core biopsy, upper central - FIBROCYSTIC CHANGES, ADENOSIS AND FAT NECROSIS WITH CALCIFICATIONS - NO MALIGNANCY IDENTIFIED 3. Breast, right, needle core biopsy, upper outer - FIBROCYSTIC CHANGES, ADENOSIS AND FAT NECROSIS WITH CALCIFICATIONS - NO MALIGNANCY IDENTIFIED      CURRENT THERAPY:  AdjuvantAnastrozole'1mg'$  daily starting 08/2017. Held from 12/26/18-04/2019 due to significant joint pain in hands  INTERVAL HISTORY:  Felicia Bauer is here for a follow up of right breast cancer. She presents to the clinic alone.  She is doing well overall.  She underwent cervical spinal surgery in December 2020, her neck pain resolved after surgery.  She has mild right knee pain, but does not limit her physical activities.  She is tolerating anastrozole well, mild hot flashes, no other noticeable side effects.   She has good appetite and energy level, try to lose some weight.  Review of system otherwise negative.   MEDICAL HISTORY:  Past Medical History:  Diagnosis Date  . Arthritis    knee   . Breast cancer, right ([place]) 04/2017  . Complication of anesthesia    hard to wake up post-op; itching after anesthesia  . GERD (gastroesophageal reflux disease)   . History of vertigo   . Hypertension    states under control with med., has been on med. > 10 yr.  . Mitral valve prolapse   . Panic attacks   . Personal history of radiation therapy   . Wears partial dentures    upper    SURGICAL HISTORY: Past Surgical History:  Procedure Laterality Date  . ANTERIOR CERVICAL DECOMP/DISCECTOMY FUSION N/A 05/24/2013   Procedure: CERVICAL FOUR-FIVE, CERVICAL FIVE-SIX, CERVICAL SIX-SEVEN ANTERIOR CERVICAL DECOMPRESSION/DISCECTOMY FUSION 3 LEVELS;  Surgeon: Faythe Ghee, MD;  Location: [facility];  Service: Neurosurgery;  Laterality: N/A;  . ANTERIOR CERVICAL DECOMP/DISCECTOMY FUSION  10/27/2016   C3-4  . BREAST BIOPSY Bilateral 03/2018   benign  . BREAST LUMPECTOMY Right 2018   x 2  . BREAST REDUCTION SURGERY Bilateral 05/26/2017   Procedure: LEFT BREAST MAMMARY REDUCTION  (BREAST) AND RIGHT ONCOPLASTIC RECONSTRUCTION;  Surgeon: Irene Limbo, MD;  Location: [facility];  Service: Plastics;  Laterality: Bilateral;  . CESAREAN SECTION     x 2  . DILATION AND CURETTAGE OF UTERUS  05/30/2008  . ENDOMETRIAL ABLATION  05/30/2008  . RADIOACTIVE SEED GUIDED PARTIAL MASTECTOMY WITH AXILLARY SENTINEL LYMPH NODE BIOPSY Right 05/11/2017   Procedure: RIGHT BRACKETED RADIOACTIVE SEED GUIDED PARTIAL MASTECTOMY WITH  SENTINEL LYMPH NODE BIOPSY WITH ERAS PATHWAY;  Surgeon: [name], MD;  Location: [facility];  Service: General;  Laterality: Right;  PEC BLOCK  . REDUCTION MAMMAPLASTY Bilateral   . TUBAL LIGATION      I have reviewed the social history and family history  with the patient and they are unchanged from previous note.  ALLERGIES:  has No Known Allergies.  MEDICATIONS:  Current Outpatient Medications  Medication Sig Dispense Refill  . anastrozole (ARIMIDEX) 1 MG tablet Take 1 tablet (1 mg total) by mouth daily. 90 tablet 3  . celecoxib (CELEBREX) 200 MG capsule Take 200 mg by mouth daily.     . clonazePAM (KLONOPIN) 1 MG tablet Take 0.5 mg by mouth as needed for anxiety.    . diazepam (VALIUM) 2 MG tablet TAKE '2MG'$  BY MOUTH DAILY AS NEEDED    . fexofenadine-pseudoephedrine (ALLEGRA-D 24) 180-240 MG per 24 hr tablet Take 1 tablet by mouth daily as needed (for sinus).    . gabapentin (NEURONTIN) 600 MG tablet Take 1 tablet (600 mg total) by mouth 3 (three) times daily. 100 tablet 2  . hydrochlorothiazide (HYDRODIURIL) 25 MG tablet Take 25 mg by mouth daily.    Marland Kitchen HYDROcodone-acetaminophen (NORCO) 5-325 MG tablet Take 1-2 tablets by mouth every 4 (four) hours as needed for moderate pain. 30 tablet 0  . methocarbamol (ROBAXIN) 500 MG tablet Take 1 tablet (500 mg total) by mouth at bedtime as needed for muscle spasms. 10 tablet 0  . naproxen (NAPROSYN) 500 MG tablet Take 1 tablet (500 mg total) by mouth 2 (two) times daily with a meal. 21 tablet 0  . pantoprazole (PROTONIX) 40 MG tablet Take 40 mg daily by mouth.  2  . venlafaxine XR (EFFEXOR-XR) 150 MG 24 hr capsule TAKE 1 CAPSULE (150 MG TOTAL) BY MOUTH DAILY WITH BREAKFAST. 30 capsule 8   No current facility-administered medications for this visit.    PHYSICAL EXAMINATION: ECOG PERFORMANCE STATUS: 1 - Symptomatic but completely ambulatory  Vitals:   10/11/19 1300  BP: 136/83  Pulse: 66  Resp: 17  Temp: 97.8 F (36.6 C)  SpO2: 99%   Filed Weights   10/11/19 1300  Weight: 195 lb (88.5 kg)    GENERAL:alert, no distress and comfortable SKIN: skin color, texture, turgor are normal, no rashes or significant lesions EYES: normal, Conjunctiva are pink and non-injected, sclera clear NECK:  supple, thyroid normal size, non-tender, without nodularity LYMPH:  no palpable lymphadenopathy in the cervical, axillary  LUNGS: clear to auscultation and percussion with normal breathing effort HEART: regular rate & rhythm and no murmurs and no lower extremity edema ABDOMEN:abdomen soft, non-tender and normal bowel sounds Musculoskeletal:no cyanosis of digits and no clubbing  NEURO: alert & oriented x 3 with fluent speech, no focal motor/sensory deficits Breasts: Breast inspection showed them to be symmetrical with no nipple discharge. Palpation of the breasts and axilla revealed no obvious mass that I could appreciate.   LABORATORY DATA:  I have reviewed the data as listed CBC Latest Ref Rng & Units 02/15/2019 04/16/2018 01/15/2018  WBC 4.0 - 10.5 K/uL 5.4 6.0 5.8  Hemoglobin 12.0 - 15.0 g/dL 12.3 12.8 12.0  Hematocrit 36.0 - 46.0 % 39.3 41.3 37.7  Platelets 150 - 400 K/uL 222 238 208     CMP Latest Ref Rng & Units 02/15/2019 04/16/2018 01/15/2018  Glucose 70 - 99 mg/dL 111(H) 88 97  BUN 6 - 20 mg/dL '17 13 18  '$ Creatinine 0.44 - 1.00 mg/dL 0.71 0.83 0.77  Sodium 135 - 145 mmol/L 140 142 140  Potassium 3.5 - 5.1  mmol/L 3.6 4.0 4.1  Chloride 98 - 111 mmol/L 104 104 103  CO2 22 - 32 mmol/L '27 29 26  '$ Calcium 8.9 - 10.3 mg/dL 9.4 10.2 9.8  Total Protein 6.5 - 8.1 g/dL 6.9 7.4 7.3  Total Bilirubin 0.3 - 1.2 mg/dL 0.3 0.2(L) 0.4  Alkaline Phos 38 - 126 U/L 69 66 66  AST 15 - 41 U/L '18 16 19  '$ ALT 0 - 44 U/L '16 15 21      '$ RADIOGRAPHIC STUDIES: I have personally reviewed the radiological images as listed and agreed with the findings in the report. No results found.   ASSESSMENT & PLAN:  Shekelia Bauer is a 59 y.o. female with    1. Malignant neoplasm of upper-outer quadrant of right breast, invasive ductal carcinoma, stage IA, pT2N0M0, ER+/PR +, HER2 -, Grade I, and DCIS ER+/PR+, RS 12 -She was diagnosed in 03/2017. She is s/p right breast lumpectomy with SLNB, breast  reconstruction and adjuvant radiation.RS 12 with no benefit of chemotherapy.  -She started anti-estrogen therapy with anastrozole in 08/2017.Held from 12/26/18-04/2019 due to significant cervical radiculopathy and joint pain in hands. -Her neck pain has resolved after surgery -she is tolerating anastrozole well now with improving hot flashes.  -She is clinically doing well, exam was unremarkable, mammogram last week was unremarkable.  No clinical concern for cancer recurrence. -Lab reviewed, CBC and CMP are within normal limits -Continue anastrozole, follow-up in 6 months  2. Cervical radiculopathy, joint pain and neuropathy of hands -She stopped anastrozole in 12/2018 and this only mildly improved. She restarted anastrozole in 04/2019  -Her neck pain and the neuropathy in hands have resolved after surgery, right knee pain is mild and stable   3. Hypertension, GERD, anxiety -Follow-up with primary care physician  4. Hot flash  -on Effexor150 mg daily due to persistent severe hot flash.  -Her hot flashes have improved recently.   5. Bone health -04/2018 DEXA shows normal bone health with lowest T-score of -0.1 at right hip. -Wepreviouslydiscussed the potential adverse impact of anastrozole for her bone density. I encouraged her to take calcium and vitamin D. Next DEXA in 04/2020  PLAN: -Continue anastrozole  -Lab and f/u in 6 months     No problem-specific Assessment & Plan notes found for this encounter.   No orders of the defined types were placed in this encounter.  All questions were answered. The patient knows to call the clinic with any problems, questions or concerns. No barriers to learning was detected. The total time spent in the appointment was 25 minutes.     Truitt Merle, MD 10/09/2019   I, Joslyn Devon, am acting as scribe for Truitt Merle, MD.   I have reviewed the above documentation for accuracy and completeness, and I agree with the above.

## 2019-10-11 ENCOUNTER — Other Ambulatory Visit: Payer: Self-pay

## 2019-10-11 ENCOUNTER — Inpatient Hospital Stay: Payer: 59 | Attending: Hematology

## 2019-10-11 ENCOUNTER — Telehealth: Payer: Self-pay | Admitting: Hematology

## 2019-10-11 ENCOUNTER — Inpatient Hospital Stay (HOSPITAL_BASED_OUTPATIENT_CLINIC_OR_DEPARTMENT_OTHER): Payer: 59 | Admitting: Hematology

## 2019-10-11 VITALS — BP 136/83 | HR 66 | Temp 97.8°F | Resp 17 | Ht 61.0 in | Wt 195.0 lb

## 2019-10-11 DIAGNOSIS — M2550 Pain in unspecified joint: Secondary | ICD-10-CM | POA: Diagnosis not present

## 2019-10-11 DIAGNOSIS — G629 Polyneuropathy, unspecified: Secondary | ICD-10-CM | POA: Insufficient documentation

## 2019-10-11 DIAGNOSIS — M5412 Radiculopathy, cervical region: Secondary | ICD-10-CM | POA: Diagnosis not present

## 2019-10-11 DIAGNOSIS — I10 Essential (primary) hypertension: Secondary | ICD-10-CM | POA: Insufficient documentation

## 2019-10-11 DIAGNOSIS — C50411 Malignant neoplasm of upper-outer quadrant of right female breast: Secondary | ICD-10-CM | POA: Insufficient documentation

## 2019-10-11 DIAGNOSIS — Z923 Personal history of irradiation: Secondary | ICD-10-CM | POA: Insufficient documentation

## 2019-10-11 DIAGNOSIS — Z79899 Other long term (current) drug therapy: Secondary | ICD-10-CM | POA: Insufficient documentation

## 2019-10-11 DIAGNOSIS — K219 Gastro-esophageal reflux disease without esophagitis: Secondary | ICD-10-CM | POA: Insufficient documentation

## 2019-10-11 DIAGNOSIS — M25561 Pain in right knee: Secondary | ICD-10-CM | POA: Diagnosis not present

## 2019-10-11 DIAGNOSIS — R232 Flushing: Secondary | ICD-10-CM | POA: Insufficient documentation

## 2019-10-11 DIAGNOSIS — M542 Cervicalgia: Secondary | ICD-10-CM | POA: Diagnosis not present

## 2019-10-11 DIAGNOSIS — F419 Anxiety disorder, unspecified: Secondary | ICD-10-CM | POA: Insufficient documentation

## 2019-10-11 DIAGNOSIS — Z170 Estrogen receptor positive status [ER+]: Secondary | ICD-10-CM

## 2019-10-11 DIAGNOSIS — Z79811 Long term (current) use of aromatase inhibitors: Secondary | ICD-10-CM | POA: Diagnosis not present

## 2019-10-11 LAB — CBC WITH DIFFERENTIAL (CANCER CENTER ONLY)
Abs Immature Granulocytes: 0.02 10*3/uL (ref 0.00–0.07)
Basophils Absolute: 0 10*3/uL (ref 0.0–0.1)
Basophils Relative: 0 %
Eosinophils Absolute: 0.1 10*3/uL (ref 0.0–0.5)
Eosinophils Relative: 2 %
HCT: 39.1 % (ref 36.0–46.0)
Hemoglobin: 12.4 g/dL (ref 12.0–15.0)
Immature Granulocytes: 0 %
Lymphocytes Relative: 34 %
Lymphs Abs: 2.3 10*3/uL (ref 0.7–4.0)
MCH: 27.5 pg (ref 26.0–34.0)
MCHC: 31.7 g/dL (ref 30.0–36.0)
MCV: 86.7 fL (ref 80.0–100.0)
Monocytes Absolute: 0.7 10*3/uL (ref 0.1–1.0)
Monocytes Relative: 11 %
Neutro Abs: 3.6 10*3/uL (ref 1.7–7.7)
Neutrophils Relative %: 53 %
Platelet Count: 228 10*3/uL (ref 150–400)
RBC: 4.51 MIL/uL (ref 3.87–5.11)
RDW: 13.5 % (ref 11.5–15.5)
WBC Count: 6.8 10*3/uL (ref 4.0–10.5)
nRBC: 0 % (ref 0.0–0.2)

## 2019-10-11 LAB — CMP (CANCER CENTER ONLY)
ALT: 14 U/L (ref 0–44)
AST: 20 U/L (ref 15–41)
Albumin: 4 g/dL (ref 3.5–5.0)
Alkaline Phosphatase: 78 U/L (ref 38–126)
Anion gap: 9 (ref 5–15)
BUN: 11 mg/dL (ref 6–20)
CO2: 30 mmol/L (ref 22–32)
Calcium: 9.3 mg/dL (ref 8.9–10.3)
Chloride: 102 mmol/L (ref 98–111)
Creatinine: 0.73 mg/dL (ref 0.44–1.00)
GFR, Est AFR Am: >60 mL/min (ref >60)
GFR, Estimated: >60 mL/min (ref >60)
Glucose, Bld: 78 mg/dL (ref 70–99)
Potassium: 3.8 mmol/L (ref 3.5–5.1)
Sodium: 141 mmol/L (ref 135–145)
Total Bilirubin: 0.3 mg/dL (ref 0.3–1.2)
Total Protein: 7.1 g/dL (ref 6.5–8.1)

## 2019-10-11 NOTE — Telephone Encounter (Signed)
Scheduled appt per 4/30 los  Spoke with pt and they are aware of their appt date and time.

## 2019-10-12 ENCOUNTER — Encounter: Payer: Self-pay | Admitting: Hematology

## 2019-11-10 ENCOUNTER — Other Ambulatory Visit: Payer: Self-pay | Admitting: Hematology

## 2019-11-10 DIAGNOSIS — Z170 Estrogen receptor positive status [ER+]: Secondary | ICD-10-CM

## 2020-04-06 ENCOUNTER — Telehealth: Payer: Self-pay | Admitting: Hematology

## 2020-04-06 ENCOUNTER — Other Ambulatory Visit: Payer: Self-pay

## 2020-04-06 ENCOUNTER — Ambulatory Visit
Admission: RE | Admit: 2020-04-06 | Discharge: 2020-04-06 | Disposition: A | Discharge status: Home / Self Care | Payer: 59 | Source: Ambulatory Visit | Attending: Hematology | Admitting: Hematology

## 2020-04-06 DIAGNOSIS — Z170 Estrogen receptor positive status [ER+]: Secondary | ICD-10-CM

## 2020-04-06 DIAGNOSIS — R921 Mammographic calcification found on diagnostic imaging of breast: Secondary | ICD-10-CM

## 2020-04-06 DIAGNOSIS — C50411 Malignant neoplasm of upper-outer quadrant of right female breast: Secondary | ICD-10-CM

## 2020-04-06 NOTE — Telephone Encounter (Signed)
Rescheduled appointments per 10/25 Inbasket msg. Spoke to patient who is aware of appointment date and time.

## 2020-04-10 ENCOUNTER — Inpatient Hospital Stay: Payer: 59

## 2020-04-10 ENCOUNTER — Inpatient Hospital Stay: Payer: 59 | Admitting: Hematology

## 2020-04-24 ENCOUNTER — Other Ambulatory Visit: Payer: Self-pay | Admitting: Hematology

## 2020-04-29 NOTE — Progress Notes (Signed)
[place]   [phone] [phone]   Clinic Follow up Note   Patient Care Team: Orpah Melter, MD as PCP - General (Family Medicine) Truitt Merle, MD as Consulting Physician (Hematology) [name], MD as Consulting Physician (General Surgery) Kyung Rudd, MD as Consulting Physician (Radiation Oncology) Irene Limbo, MD as Consulting Physician (Plastic Surgery) Delice Bison, Charlestine Massed, NP as Nurse Practitioner (Hematology and Oncology) Aurea Graff, PA-C (Inactive) as Physician Assistant (Physician Assistant)  Date of Service:  05/01/2020  CHIEF COMPLAINT:  F/u of right breast cancer  SUMMARY OF ONCOLOGIC HISTORY: Oncology History Overview Note  Cancer Staging Malignant neoplasm of upper-outer quadrant of right breast, estrogen receptor positive ([place]) Staging form: Breast, AJCC 8th Edition - Clinical stage from 04/12/2017: Stage IIA (cT3, cN0, cM0, G1, ER: Positive, PR: Positive, HER2: Negative) - Signed by Truitt Merle, MD on 04/16/2017 - Pathologic: Stage IA (pT2, pN0(sn), cM0, G1, ER: Positive, PR: Positive, HER2: Negative, Oncotype DX score: 12) - Unsigned     Malignant neoplasm of upper-outer quadrant of right breast, estrogen receptor positive ([place])  04/07/2017 Mammogram   IMPRESSION: 1. Highly suspicious mass within the upper right breast, 12 to 12:30 o'clock axis, at posterior depth, with associated architectural distortion and pleomorphic calcifications. The mass measures 1 cm greatest dimension. The mass and associated microcalcifications measure 1.4 cm. No sonographic correlate identified. Recommend stereotactic biopsy using 3D tomosynthesis guidance. 2. Additional punctate and coarse heterogeneous calcifications within the upper outer quadrant of the right breast, 11-12 o'clock axis region, at middle depth, with a suspicious segmental distribution, spanning approximately 5 cm, the most suspicious calcifications best  seen on the CC magnification views. Recommend additional stereotactic biopsy for these right breast calcifications to exclude multifocal/multicentric disease and/or define extent of disease.   04/12/2017 Initial Biopsy   Diagnosis 1. 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 - SEE COMMENT. 2. Breast, right, needle core biopsy, loosely grouped calcs in more central, upper outer quadrant - DUCTAL CARCINOMA IN SITU WITH CALCIFICATIONS. - LOBULAR NEOPLASIA (ATYPICAL LOBULAR HYPERPLASIA). - FIBROCYSTIC CHANGES WITH ADENOSIS AND CALCIFICATIONS.   04/12/2017 Receptors her2   Estrogen Receptor: 95%, POSITIVE, STRONG STAINING INTENSITY Progesterone Receptor: 95%, POSITIVE, STRONG STAINING INTENSITY Proliferation Marker Ki67: 3% HER2: NEGATIVE   04/16/2017 Initial Diagnosis   Malignant neoplasm of upper-outer quadrant of right breast, estrogen receptor positive ([place])   04/21/2017 Mammogram   04/21/2017 Mammogram IMPRESSION: 1. 2.8 x 2.0 x 0.5 cm biopsy-proven invasive ductal carcinoma and ductal carcinoma in situ in the posterior aspect of the upper-outer quadrant of the right breast. 2. No mass or abnormal enhancement at the location of the recently biopsied low-grade ductal carcinoma in situ with calcifications and atypical lobular hyperplasia in the central aspect of the upper-outer quadrant of the right breast. 3. No evidence of malignancy on the left.   05/11/2017 Surgery   Right breast lumpectomy and SLN biopsy    05/11/2017 Pathology Results   Right lumpectomy showed a 2.1cm invasive ductal carcinoma, margins are negative, (+)ADH, 6 nodes are all negative    05/11/2017 Oncotype testing   RS 12, predicts 9-year distant recurrence 3% with tamoxifen or AI    05/26/2017 Surgery   Breast reduction by Dr. Iran Planas    05/26/2017 Pathology Results    Diagnosis 1. Breast, Mammoplasty, left - FIBROCYSTIC CHANGES WITH ADENOSIS AND CALCIFICATIONS. - THERE IS NO EVIDENCE OF  MALIGNANCY. 2. Breast, Mammoplasty, right - FIBROCYSTIC CHANGES WITH ADENOSIS AND CALCIFICATIONS. - FAT NECROSIS. - THERE IS NO EVIDENCE OF MALIGNANCY.   07/06/2017 - 08/18/2017 Radiation Therapy   Adjuvant breast radiation with Dr. Lisbeth Renshaw   08/2017 -  Anti-estrogen oral therapy   Anastrozole 108m daily   04/02/2018 Mammogram   Mammogram 04/02/18 There are calcifications near the lateral right lumpectomy site best seen on the CC view. There are also calcifications located behind the nipple on the CC view, likely superiorly located on the 90 degree view near the site of the second lumpectomy. There are also new calcifications located in the posterior slightly medial left breast, best seen on the CC view.   Mammographic images were processed with CAD.   04/05/2018 Pathology Results   Diagnosis 04/05/18 1. Breast, left, needle core biopsy, upper inner - FIBROCYSTIC CHANGES AND ADENOSIS WITH CALCIFICATIONS - NO MALIGNANCY IDENTIFIED 2. Breast, right, needle core biopsy, upper central - FIBROCYSTIC CHANGES, ADENOSIS AND FAT NECROSIS WITH CALCIFICATIONS - NO MALIGNANCY IDENTIFIED 3. Breast, right, needle core biopsy, upper outer - FIBROCYSTIC CHANGES, ADENOSIS AND FAT NECROSIS WITH CALCIFICATIONS - NO MALIGNANCY IDENTIFIED      CURRENT THERAPY:  AdjuvantAnastrozole179mdaily starting 08/2017.Held from 12/26/18-04/2019 due to significant joint pain in hands  INTERVAL HISTORY:  [name] here for a follow up of right breast cancer. She was last seen by me 7 months ago. She presents to the clinic alone. She notes her hot flashes have increased. She will wake up with sheets and clothes wet. She tried Effexor and Gabapentin but does not feel this helps enough. She feels she can still function.     REVIEW OF SYSTEMS:   Constitutional: Denies fevers,  chills or abnormal weight loss (+) hot flashes.  Eyes: Denies blurriness of vision Ears, nose, mouth, throat, and face: Denies mucositis or sore throat Respiratory: Denies cough, dyspnea or wheezes Cardiovascular: Denies palpitation, chest discomfort or lower extremity swelling Gastrointestinal:  Denies nausea, heartburn or change in bowel habits Skin: Denies abnormal skin rashes Lymphatics: Denies new lymphadenopathy or easy bruising Neurological:Denies numbness, tingling or new weaknesses Behavioral/Psych: Mood is stable, no new changes  All other systems were reviewed with the patient and are negative.  MEDICAL HISTORY:  Past Medical History:  Diagnosis Date   Arthritis    knee    Breast [place], right [id number] Complication of anesthesia    hard to wake up post-op; itching after anesthesia   GERD (gastroesophageal reflux disease)    History of vertigo    Hypertension    states under control with med., has been on med. > 10 yr.   Mitral valve prolapse    Panic attacks    Personal history of radiation therapy    Wears partial dentures    upper    SURGICAL HISTORY: Past Surgical History:  Procedure Laterality Date   ANTERIOR CERVICAL DECOMP/DISCECTOMY FUSION N/A 05/24/2013   Procedure: CERVICAL FOUR-FIVE, CERVICAL FIVE-SIX, CERVICAL SIX-SEVEN ANTERIOR CERVICAL DECOMPRESSION/DISCECTOMY FUSION 3 LEVELS;  Surgeon: RaFaythe GheeMD;  [facility];  Service: Neurosurgery;  Laterality: N/A;   ANTERIOR CERVICAL DECOMP/DISCECTOMY FUSION  10/27/2016   C3-4   BREAST BIOPSY Bilateral 03/2018   benign   BREAST LUMPECTOMY Right 2018   x 2   BREAST REDUCTION SURGERY Bilateral 05/26/2017   Procedure: LEFT BREAST MAMMARY REDUCTION  (BREAST) AND RIGHT ONCOPLASTIC RECONSTRUCTION;  Surgeon: ThIrene LimboMD;  [facility] Service: Pl[facility];  Laterality: Bilateral;   CESAREAN SECTION     x 2   DILATION AND CURETTAGE OF UTERUS   05/30/2008   ENDOMETRIAL ABLATION  05/30/2008   RADIOACTIVE SEED GUIDED PARTIAL MASTECTOMY WITH AXILLARY SENTINEL LYMPH NODE BIOPSY Right 05/11/2017   Procedure: RIGHT BRACKETED RADIOACTIVE SEED GUIDED PARTIAL MASTECTOMY WITH  SENTINEL LYMPH NODE BIOPSY WITH ERAS PATHWAY;  Surgeon: [name], MD;  Location: [place];  Service: General;  Laterality: Right;  PEC BLOCK   REDUCTION MAMMAPLASTY Bilateral    TUBAL LIGATION      I have reviewed the social history and family history with the patient and they are unchanged from previous note.  ALLERGIES:  has No Known Allergies.  MEDICATIONS:  Current Outpatient Medications  Medication Sig Dispense Refill   anastrozole (ARIMIDEX) 1 MG tablet TAKE 1 TABLET BY MOUTH EVERY DAY 30 tablet 11   aspirin 81 MG EC tablet Take by mouth.     Biotin 1000 MCG tablet Take by mouth.     Cholecalciferol 50 MCG (2000 UT) CAPS Take by mouth.     clonazePAM (KLONOPIN) 1 MG tablet Take 0.5 mg by mouth as needed for anxiety.     diazepam (VALIUM) 2 MG tablet TAKE 2MG BY MOUTH DAILY AS NEEDED     diclofenac Sodium (VOLTAREN) 1 % GEL diclofenac 1 % topical gel  APPLY 2 3 TIMES DAILY AS NEEDED     fexofenadine-pseudoephedrine (ALLEGRA-D 24) 180-240 MG per 24 hr tablet Take 1 tablet by mouth daily as needed (for sinus).     gabapentin (NEURONTIN) 600 MG tablet Take 1 tablet (600 mg total) by mouth 3 (three) times daily. 100 tablet 2   hydrochlorothiazide (HYDRODIURIL) 25 MG tablet Take 25 mg by mouth daily.     HYDROcodone-acetaminophen (NORCO) 5-325 MG tablet Take 1-2 tablets by mouth every 4 (four) hours as needed for moderate pain. 30 tablet 0   lisinopril-hydrochlorothiazide (ZESTORETIC) 20-25 MG tablet lisinopril 20 mg-hydrochlorothiazide 25 mg tablet  TAKE 1 TABLET BY MOUTH EVERY DAY     Meclizine HCl 25 MG CHEW Chew by mouth.     methocarbamol (ROBAXIN) 500 MG tablet Take 1 tablet (500 mg total) by mouth at bedtime as  needed for muscle spasms. 10 tablet 0   naproxen (NAPROSYN) 500 MG tablet Take 1 tablet (500 mg total) by mouth 2 (two) times daily with a meal. 21 tablet 0   pantoprazole (PROTONIX) 40 MG tablet Take 40 mg daily by mouth.  2   venlafaxine XR (EFFEXOR-XR) 150 MG 24 hr capsule TAKE 1 CAPSULE (150 MG TOTAL) BY MOUTH DAILY WITH BREAKFAST. 30 capsule 8   vitamin E 180 MG (400 UNITS) capsule Take by mouth.     No current facility-administered medications for this visit.    PHYSICAL EXAMINATION: ECOG PERFORMANCE STATUS: 1 - Symptomatic but completely ambulatory  Vitals:   05/01/20 1344  BP: 138/76  Pulse: 63  Resp: 18  Temp: (!) 97.1 F (36.2 C)  SpO2: 100%   Filed Weights   05/01/20 1344  Weight: 195 lb 11.2 oz (88.8 kg)    GENERAL:alert, no distress and comfortable SKIN: skin color, texture, turgor are normal, no rashes or significant lesions EYES: normal, Conjunctiva are pink and non-injected, sclera clear  NECK: supple, thyroid normal size, non-tender, without nodularity LYMPH:  no palpable lymphadenopathy in the cervical, axillary  LUNGS: clear to auscultation and percussion with normal breathing effort HEART: regular rate & rhythm and no murmurs and no lower  extremity edema ABDOMEN:abdomen soft, non-tender and normal bowel sounds Musculoskeletal:no cyanosis of digits and no clubbing  NEURO: alert & oriented x 3 with fluent speech, no focal motor/sensory deficits BREAST: S/p right lumpectomy: Surgical incision healed well. No palpable mass, nodules or adenopathy bilaterally. Breast exam benign.   LABORATORY DATA:  I have reviewed the data as listed CBC Latest Ref Rng & Units 05/01/2020 10/11/2019 02/15/2019  WBC 4.0 - 10.5 K/uL 5.9 6.8 5.4  Hemoglobin 12.0 - 15.0 g/dL 11.5(L) 12.4 12.3  Hematocrit 36 - 46 % 36.4 39.1 39.3  Platelets 150 - 400 K/uL 211 228 222     CMP Latest Ref Rng & Units 05/01/2020 10/11/2019 02/15/2019  Glucose 70 - 99 mg/dL 71 78 111(H)  BUN 6 - 20  mg/dL _0 Creatinine 0.44 - 1.00 mg/dL 0.74 0.73 0.71  Sodium 135 - 145 mmol/L 142 141 140  Potassium 3.5 - 5.1 mmol/L 3.6 3.8 3.6  Chloride 98 - 111 mmol/L 104 102 104  CO2 22 - 32 mmol/L _1 Calcium 8.9 - 10.3 mg/dL 9.5 9.3 9.4  Total Protein 6.5 - 8.1 g/dL 7.1 7.1 6.9  Total Bilirubin 0.3 - 1.2 mg/dL 0.3 0.3 0.3  Alkaline Phos 38 - 126 U/L 65 78 69  AST 15 - 41 U/L _2 ALT 0 - 44 U/L _3 RADIOGRAPHIC STUDIES: I have personally reviewed the radiological images as listed and agreed with the findings in the report. No results found.   ASSESSMENT & PLAN:  Felicia Bauer is a 59 y.o. female with    1. Malignant neoplasm of upper-outer quadrant of right breast, invasive ductal carcinoma, stage IA, pT2N0M0, ER+/PR +, HER2 -, Grade I, and DCIS ER+/PR+, RS 12 -She was diagnosed in 03/2017. She is s/p right breast lumpectomy with SLNB, breast reconstruction and adjuvant radiation.RS 12 with no benefit of chemotherapy. -She started anti-estrogen therapy with anastrozole in 08/2017.Held from 12/26/18-04/2019 due to significantcervical radiculopathy andjoint pain in hands. Managing with hot flashes on Effexor and Gabapentin.  -She is clinically doing well. Lab reviewed, her CBC and CMP are within normal limits except Hg 11.5. Her physical exam and her 03/2020 mammogram were unremarkable. There is no clinical concern for recurrence. -Continue surveillance. Next Mammogram in 03/2021. I reviewed what concerning symptoms to watch for.  -Continue Anastrozole  -F/u in 6 months    2.Cervical radiculopathy  -Her neck pain and the neuropathy in hands have resolved after surgery, right knee pain is mild and stable  3. Hypertension, GERD, anxiety -Follow-up with primary care physician  4. Hot flash  -on Effexor150 mg daily and Gabapentin 640m (from her prior neuropathy) due to persistent severe hot flash.  -Her hot flashes are moderate to significant  but manageable and don't impeded daily function.    5. Bone health -04/2018 DEXA shows normal bone health with lowest T-score of -0.1 at right hip. -Wepreviouslydiscussed the potential adverse impact of anastrozole for her bone density. I encouraged her to take calcium and vitamin D.Next DEXA in 04/2020, will order today.   6. Mild Anemia  -Hg 11.5 today (05/01/20). Per pt she had prior history of mild anemia.  -I recommend she start multivitamin. I will check with her GI about next colonoscopy.  -repeat lab in 3 months   PLAN: -Continue anastrozole  -DEXA in 1-2 months   -Lab in 3 months for anemia f/u  -Lab and f/u in  6 months    No problem-specific Assessment & Plan notes found for this encounter.   Orders Placed This Encounter  Procedures   DG Bone Density    Standing Status:   Future    Standing Expiration Date:   05/01/2021    Order Specific Question:   Reason for Exam (SYMPTOM  OR DIAGNOSIS REQUIRED)    Answer:   screening    Order Specific Question:   Is the patient pregnant?    Answer:   No    Order Specific Question:   Preferred imaging location?    Answer:   [facility]   All questions were answered. The patient knows to call the clinic with any problems, questions or concerns. No barriers to learning was detected. The total time spent in the appointment was 30 minutes.     Truitt Merle, MD 05/01/2020   I, Joslyn Devon, am acting as scribe for Truitt Merle, MD.   I have reviewed the above documentation for accuracy and completeness, and I agree with the above.

## 2020-05-01 ENCOUNTER — Inpatient Hospital Stay (HOSPITAL_BASED_OUTPATIENT_CLINIC_OR_DEPARTMENT_OTHER): Payer: 59 | Admitting: Hematology

## 2020-05-01 ENCOUNTER — Inpatient Hospital Stay: Payer: 59 | Attending: Hematology

## 2020-05-01 ENCOUNTER — Other Ambulatory Visit: Payer: Self-pay

## 2020-05-01 VITALS — BP 138/76 | HR 63 | Temp 97.1°F | Resp 18 | Ht 61.0 in | Wt 195.7 lb

## 2020-05-01 DIAGNOSIS — Z79811 Long term (current) use of aromatase inhibitors: Secondary | ICD-10-CM | POA: Diagnosis not present

## 2020-05-01 DIAGNOSIS — Z923 Personal history of irradiation: Secondary | ICD-10-CM | POA: Diagnosis not present

## 2020-05-01 DIAGNOSIS — G629 Polyneuropathy, unspecified: Secondary | ICD-10-CM | POA: Diagnosis not present

## 2020-05-01 DIAGNOSIS — Z79899 Other long term (current) drug therapy: Secondary | ICD-10-CM | POA: Diagnosis not present

## 2020-05-01 DIAGNOSIS — F419 Anxiety disorder, unspecified: Secondary | ICD-10-CM | POA: Insufficient documentation

## 2020-05-01 DIAGNOSIS — Z170 Estrogen receptor positive status [ER+]: Secondary | ICD-10-CM | POA: Diagnosis not present

## 2020-05-01 DIAGNOSIS — E2839 Other primary ovarian failure: Secondary | ICD-10-CM

## 2020-05-01 DIAGNOSIS — M5412 Radiculopathy, cervical region: Secondary | ICD-10-CM | POA: Diagnosis not present

## 2020-05-01 DIAGNOSIS — I10 Essential (primary) hypertension: Secondary | ICD-10-CM | POA: Insufficient documentation

## 2020-05-01 DIAGNOSIS — R232 Flushing: Secondary | ICD-10-CM | POA: Insufficient documentation

## 2020-05-01 DIAGNOSIS — C50411 Malignant neoplasm of upper-outer quadrant of right female breast: Secondary | ICD-10-CM | POA: Diagnosis not present

## 2020-05-01 DIAGNOSIS — D649 Anemia, unspecified: Secondary | ICD-10-CM | POA: Insufficient documentation

## 2020-05-01 DIAGNOSIS — K219 Gastro-esophageal reflux disease without esophagitis: Secondary | ICD-10-CM | POA: Diagnosis not present

## 2020-05-01 LAB — CMP (CANCER CENTER ONLY)
ALT: 10 U/L (ref 0–44)
AST: 15 U/L (ref 15–41)
Albumin: 4.1 g/dL (ref 3.5–5.0)
Alkaline Phosphatase: 65 U/L (ref 38–126)
Anion gap: 8 (ref 5–15)
BUN: 14 mg/dL (ref 6–20)
CO2: 30 mmol/L (ref 22–32)
Calcium: 9.5 mg/dL (ref 8.9–10.3)
Chloride: 104 mmol/L (ref 98–111)
Creatinine: 0.74 mg/dL (ref 0.44–1.00)
GFR, Estimated: >60 mL/min (ref >60)
Glucose, Bld: 71 mg/dL (ref 70–99)
Potassium: 3.6 mmol/L (ref 3.5–5.1)
Sodium: 142 mmol/L (ref 135–145)
Total Bilirubin: 0.3 mg/dL (ref 0.3–1.2)
Total Protein: 7.1 g/dL (ref 6.5–8.1)

## 2020-05-01 LAB — CBC WITH DIFFERENTIAL (CANCER CENTER ONLY)
Abs Immature Granulocytes: 0.01 10*3/uL (ref 0.00–0.07)
Basophils Absolute: 0 10*3/uL (ref 0.0–0.1)
Basophils Relative: 1 %
Eosinophils Absolute: 0.1 10*3/uL (ref 0.0–0.5)
Eosinophils Relative: 2 %
HCT: 36.4 % (ref 36.0–46.0)
Hemoglobin: 11.5 g/dL — ABNORMAL LOW (ref 12.0–15.0)
Immature Granulocytes: 0 %
Lymphocytes Relative: 41 %
Lymphs Abs: 2.4 10*3/uL (ref 0.7–4.0)
MCH: 27.8 pg (ref 26.0–34.0)
MCHC: 31.6 g/dL (ref 30.0–36.0)
MCV: 88.1 fL (ref 80.0–100.0)
Monocytes Absolute: 0.6 10*3/uL (ref 0.1–1.0)
Monocytes Relative: 10 %
Neutro Abs: 2.7 10*3/uL (ref 1.7–7.7)
Neutrophils Relative %: 46 %
Platelet Count: 211 10*3/uL (ref 150–400)
RBC: 4.13 MIL/uL (ref 3.87–5.11)
RDW: 13.1 % (ref 11.5–15.5)
WBC Count: 5.9 10*3/uL (ref 4.0–10.5)
nRBC: 0 % (ref 0.0–0.2)

## 2020-05-02 ENCOUNTER — Encounter: Payer: Self-pay | Admitting: Hematology

## 2020-05-04 ENCOUNTER — Telehealth: Payer: Self-pay | Admitting: Hematology

## 2020-05-04 NOTE — Telephone Encounter (Signed)
Scheduled appts per 11/19 los. Pt confirmed appt date and time.

## 2020-08-07 ENCOUNTER — Other Ambulatory Visit: Payer: Self-pay

## 2020-08-07 ENCOUNTER — Inpatient Hospital Stay: Payer: 59 | Attending: Hematology

## 2020-08-07 DIAGNOSIS — Z170 Estrogen receptor positive status [ER+]: Secondary | ICD-10-CM | POA: Insufficient documentation

## 2020-08-07 DIAGNOSIS — C50411 Malignant neoplasm of upper-outer quadrant of right female breast: Secondary | ICD-10-CM | POA: Insufficient documentation

## 2020-08-07 LAB — CMP (CANCER CENTER ONLY)
ALT: 12 U/L (ref 0–44)
AST: 14 U/L — ABNORMAL LOW (ref 15–41)
Albumin: 4.4 g/dL (ref 3.5–5.0)
Alkaline Phosphatase: 72 U/L (ref 38–126)
Anion gap: 9 (ref 5–15)
BUN: 15 mg/dL (ref 6–20)
CO2: 30 mmol/L (ref 22–32)
Calcium: 9.6 mg/dL (ref 8.9–10.3)
Chloride: 102 mmol/L (ref 98–111)
Creatinine: 0.76 mg/dL (ref 0.44–1.00)
GFR, Estimated: >60 mL/min (ref >60)
Glucose, Bld: 86 mg/dL (ref 70–99)
Potassium: 3.5 mmol/L (ref 3.5–5.1)
Sodium: 141 mmol/L (ref 135–145)
Total Bilirubin: 0.3 mg/dL (ref 0.3–1.2)
Total Protein: 7.6 g/dL (ref 6.5–8.1)

## 2020-08-07 LAB — CBC WITH DIFFERENTIAL (CANCER CENTER ONLY)
Abs Immature Granulocytes: 0.01 10*3/uL (ref 0.00–0.07)
Basophils Absolute: 0 10*3/uL (ref 0.0–0.1)
Basophils Relative: 0 %
Eosinophils Absolute: 0.1 10*3/uL (ref 0.0–0.5)
Eosinophils Relative: 2 %
HCT: 39.5 % (ref 36.0–46.0)
Hemoglobin: 12.4 g/dL (ref 12.0–15.0)
Immature Granulocytes: 0 %
Lymphocytes Relative: 38 %
Lymphs Abs: 2.7 10*3/uL (ref 0.7–4.0)
MCH: 27.7 pg (ref 26.0–34.0)
MCHC: 31.4 g/dL (ref 30.0–36.0)
MCV: 88.2 fL (ref 80.0–100.0)
Monocytes Absolute: 0.6 10*3/uL (ref 0.1–1.0)
Monocytes Relative: 9 %
Neutro Abs: 3.5 10*3/uL (ref 1.7–7.7)
Neutrophils Relative %: 51 %
Platelet Count: 246 10*3/uL (ref 150–400)
RBC: 4.48 MIL/uL (ref 3.87–5.11)
RDW: 13.1 % (ref 11.5–15.5)
WBC Count: 7 10*3/uL (ref 4.0–10.5)
nRBC: 0 % (ref 0.0–0.2)

## 2020-10-30 ENCOUNTER — Telehealth: Payer: Self-pay | Admitting: Hematology

## 2020-10-30 ENCOUNTER — Other Ambulatory Visit: Payer: Self-pay | Admitting: Hematology

## 2020-10-30 DIAGNOSIS — C50411 Malignant neoplasm of upper-outer quadrant of right female breast: Secondary | ICD-10-CM

## 2020-10-30 DIAGNOSIS — Z170 Estrogen receptor positive status [ER+]: Secondary | ICD-10-CM

## 2020-10-30 NOTE — Telephone Encounter (Signed)
Rescheduled upcoming appointment per provider's request. Patient is aware of changes. 

## 2020-11-04 NOTE — Progress Notes (Signed)
[place]   [phone] [phone]) [phone]   Clinic Follow up Note   Patient Care Team: Orpah Melter, MD as PCP - General (Family Medicine) Truitt Merle, MD as Consulting Physician (Hematology) [name], MD as Consulting Physician (General Surgery) Kyung Rudd, MD as Consulting Physician (Radiation Oncology) Irene Limbo, MD as Consulting Physician (Plastic Surgery) Delice Bison, Charlestine Massed, NP as Nurse Practitioner (Hematology and Oncology) Aurea Graff, PA-C (Inactive) as Physician Assistant (Physician Assistant)  Date of Service:  11/05/2020  CHIEF COMPLAINT: F/u of right breast cancer  SUMMARY OF ONCOLOGIC HISTORY: Oncology History Overview Note  Cancer Staging Malignant neoplasm of upper-outer quadrant of right breast, estrogen receptor positive ([place]) Staging form: Breast, AJCC 8th Edition - Clinical stage from 04/12/2017: Stage IIA (cT3, cN0, cM0, G1, ER: Positive, PR: Positive, HER2: Negative) - Signed by Truitt Merle, MD on 04/16/2017 - Pathologic: Stage IA (pT2, pN0(sn), cM0, G1, ER: Positive, PR: Positive, HER2: Negative, Oncotype DX score: 12) - Unsigned     Malignant neoplasm of upper-outer quadrant of right breast, estrogen receptor positive ([place])  04/07/2017 Mammogram   IMPRESSION: 1. Highly suspicious mass within the upper right breast, 12 to 12:30 o'clock axis, at posterior depth, with associated architectural distortion and pleomorphic calcifications. The mass measures 1 cm greatest dimension. The mass and associated microcalcifications measure 1.4 cm. No sonographic correlate identified. Recommend stereotactic biopsy using 3D tomosynthesis guidance. 2. Additional punctate and coarse heterogeneous calcifications within the upper outer quadrant of the right breast, 11-12 o'clock axis region, at middle depth, with a suspicious segmental distribution, spanning approximately 5 cm, the most suspicious calcifications best  seen on the CC magnification views. Recommend additional stereotactic biopsy for these right breast calcifications to exclude multifocal/multicentric disease and/or define extent of disease.   04/12/2017 Initial Biopsy   Diagnosis 1. 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 - SEE COMMENT. 2. Breast, right, needle core biopsy, loosely grouped calcs in more central, upper outer quadrant - DUCTAL CARCINOMA IN SITU WITH CALCIFICATIONS. - LOBULAR NEOPLASIA (ATYPICAL LOBULAR HYPERPLASIA). - FIBROCYSTIC CHANGES WITH ADENOSIS AND CALCIFICATIONS.   04/12/2017 Receptors her2   Estrogen Receptor: 95%, POSITIVE, STRONG STAINING INTENSITY Progesterone Receptor: 95%, POSITIVE, STRONG STAINING INTENSITY Proliferation Marker Ki67: 3% HER2: NEGATIVE   04/16/2017 Initial Diagnosis   Malignant neoplasm of upper-outer quadrant of right breast, estrogen receptor positive ([place])   04/21/2017 Mammogram   04/21/2017 Mammogram IMPRESSION: 1. 2.8 x 2.0 x 0.5 cm biopsy-proven invasive ductal carcinoma and ductal carcinoma in situ in the posterior aspect of the upper-outer quadrant of the right breast. 2. No mass or abnormal enhancement at the location of the recently biopsied low-grade ductal carcinoma in situ with calcifications and atypical lobular hyperplasia in the central aspect of the upper-outer quadrant of the right breast. 3. No evidence of malignancy on the left.   05/11/2017 Surgery   Right breast lumpectomy and SLN biopsy    05/11/2017 Pathology Results   Right lumpectomy showed a 2.1cm invasive ductal carcinoma, margins are negative, (+)ADH, 6 nodes are all negative    05/11/2017 Oncotype testing   RS 12, predicts 9-year distant recurrence 3% with tamoxifen or AI    05/26/2017 Surgery   Breast reduction by Dr. Iran Planas    05/26/2017 Pathology Results    Diagnosis 1. Breast, Mammoplasty, left - FIBROCYSTIC CHANGES WITH ADENOSIS AND CALCIFICATIONS. - THERE IS NO EVIDENCE OF MALIGNANCY.  2. Breast, Mammoplasty, right - FIBROCYSTIC CHANGES WITH ADENOSIS AND CALCIFICATIONS. - FAT NECROSIS. - THERE IS NO EVIDENCE OF MALIGNANCY.   07/06/2017 - 08/18/2017 Radiation Therapy   Adjuvant breast radiation with Dr. Lisbeth Renshaw   08/2017 -  Anti-estrogen oral therapy   Anastrozole 1mg  daily   04/02/2018 Mammogram   Mammogram 04/02/18 There are calcifications near the lateral right lumpectomy site best seen on the CC view. There are also calcifications located behind the nipple on the CC view, likely superiorly located on the 90 degree view near the site of the second lumpectomy. There are also new calcifications located in the posterior slightly medial left breast, best seen on the CC view.   Mammographic images were processed with CAD.   04/05/2018 Pathology Results   Diagnosis 04/05/18 1. Breast, left, needle core biopsy, upper inner - FIBROCYSTIC CHANGES AND ADENOSIS WITH CALCIFICATIONS - NO MALIGNANCY IDENTIFIED 2. Breast, right, needle core biopsy, upper central - FIBROCYSTIC CHANGES, ADENOSIS AND FAT NECROSIS WITH CALCIFICATIONS - NO MALIGNANCY IDENTIFIED 3. Breast, right, needle core biopsy, upper outer - FIBROCYSTIC CHANGES, ADENOSIS AND FAT NECROSIS WITH CALCIFICATIONS - NO MALIGNANCY IDENTIFIED      CURRENT THERAPY:  AdjuvantAnastrozole1mg  daily starting 08/2017.Held from 12/26/18-04/2019 due to significant joint pain in hands  INTERVAL HISTORY:  Felicia Bauer is here for a follow up of right breast cancer. She was last seen by me 6 months ago. She presents to the clinic alone. She is doing well overall, tried to loose weight but not successful.  She is tolerating anastrozole moderate well overall, main complain is joint pain, diffuse especially in the morning, but she feels it's still manageable and does not think it's  related to anastrozole  She does have hot flushes, day and night, still try to manage  She is interested in weight loss program   All other systems were reviewed with the patient and are negative.  MEDICAL HISTORY:  Past Medical History:  Diagnosis Date  . Arthritis    knee   . Breast cancer, right ([place]) 04/2017  . Complication of anesthesia    hard to wake up post-op; itching after anesthesia  . GERD (gastroesophageal reflux disease)   . History of vertigo   . Hypertension    states under control with med., has been on med. > 10 yr.  . Mitral valve prolapse   . Panic attacks   . Personal history of radiation therapy   . Wears partial dentures    upper    SURGICAL HISTORY: Past Surgical History:  Procedure Laterality Date  . ANTERIOR CERVICAL DECOMP/DISCECTOMY FUSION N/A 05/24/2013   Procedure: CERVICAL FOUR-FIVE, CERVICAL FIVE-SIX, CERVICAL SIX-SEVEN ANTERIOR CERVICAL DECOMPRESSION/DISCECTOMY FUSION 3 LEVELS;  Surgeon: Faythe Ghee, MD;  Location: [facility];  Service: Neurosurgery;  Laterality: N/A;  . ANTERIOR CERVICAL DECOMP/DISCECTOMY FUSION  10/27/2016   C3-4  . BREAST BIOPSY Bilateral 03/2018   benign  . BREAST LUMPECTOMY Right 2018   x 2  . BREAST REDUCTION SURGERY Bilateral 05/26/2017   Procedure: LEFT BREAST MAMMARY REDUCTION  (BREAST) AND RIGHT ONCOPLASTIC RECONSTRUCTION;  Surgeon: Irene Limbo, MD;  Location: [facility];  Service: Plastics;  Laterality: Bilateral;  . CESAREAN SECTION     x 2  . DILATION AND CURETTAGE OF UTERUS  05/30/2008  . ENDOMETRIAL ABLATION  05/30/2008  . RADIOACTIVE SEED GUIDED PARTIAL MASTECTOMY WITH AXILLARY SENTINEL LYMPH NODE BIOPSY Right 05/11/2017   Procedure: RIGHT BRACKETED RADIOACTIVE SEED GUIDED PARTIAL MASTECTOMY WITH  SENTINEL  LYMPH NODE BIOPSY WITH ERAS PATHWAY;  Surgeon: [name], MD;  Location: [facility];  Service: General;  Laterality: Right;  PEC BLOCK  . REDUCTION MAMMAPLASTY  Bilateral   . TUBAL LIGATION      I have reviewed the social history and family history with the patient and they are unchanged from previous note.  ALLERGIES:  has No Known Allergies.  MEDICATIONS:  Current Outpatient Medications  Medication Sig Dispense Refill  . anastrozole (ARIMIDEX) 1 MG tablet TAKE 1 TABLET BY MOUTH EVERY DAY 30 tablet 11  . aspirin 81 MG EC tablet Take by mouth.    . Cholecalciferol 50 MCG (2000 UT) CAPS Take by mouth.    . clonazePAM (KLONOPIN) 1 MG tablet Take 0.5 mg by mouth as needed for anxiety.    . diazepam (VALIUM) 2 MG tablet TAKE $RemoveBef'2MG'YqwKLVmcpz$  BY MOUTH DAILY AS NEEDED    . diclofenac Sodium (VOLTAREN) 1 % GEL diclofenac 1 % topical gel  APPLY 2 3 TIMES DAILY AS NEEDED    . fexofenadine-pseudoephedrine (ALLEGRA-D 24) 180-240 MG per 24 hr tablet Take 1 tablet by mouth daily as needed (for sinus).    . gabapentin (NEURONTIN) 600 MG tablet Take 1 tablet (600 mg total) by mouth 3 (three) times daily. 100 tablet 2  . hydrochlorothiazide (HYDRODIURIL) 25 MG tablet Take 25 mg by mouth daily.    Marland Kitchen HYDROcodone-acetaminophen (NORCO) 5-325 MG tablet Take 1-2 tablets by mouth every 4 (four) hours as needed for moderate pain. 30 tablet 0  . lisinopril-hydrochlorothiazide (ZESTORETIC) 20-25 MG tablet lisinopril 20 mg-hydrochlorothiazide 25 mg tablet  TAKE 1 TABLET BY MOUTH EVERY DAY    . Meclizine HCl 25 MG CHEW Chew by mouth.    . pantoprazole (PROTONIX) 40 MG tablet Take 40 mg daily by mouth.  2  . venlafaxine XR (EFFEXOR-XR) 150 MG 24 hr capsule Take 1 capsule (150 mg total) by mouth daily with breakfast. 30 capsule 8  . vitamin E 180 MG (400 UNITS) capsule Take by mouth.     No current facility-administered medications for this visit.    PHYSICAL EXAMINATION: ECOG PERFORMANCE STATUS: 1 - Symptomatic but completely ambulatory  Vitals:   11/05/20 1318  BP: (!) 148/68  Pulse: 72  Resp: 18  Temp: 97.6 F (36.4 C)  SpO2: 100%   Filed Weights   11/05/20 1318   Weight: 195 lb 8 oz (88.7 kg)    GENERAL:alert, no distress and comfortable SKIN: skin color, texture, turgor are normal, no rashes or significant lesions EYES: normal, Conjunctiva are pink and non-injected, sclera clear NECK: supple, thyroid normal size, non-tender, without nodularity LYMPH:  no palpable lymphadenopathy in the cervical, axillary  LUNGS: clear to auscultation and percussion with normal breathing effort HEART: regular rate & rhythm and no murmurs and no lower extremity edema ABDOMEN:abdomen soft, non-tender and normal bowel sounds Musculoskeletal:no cyanosis of digits and no clubbing  NEURO: alert & oriented x 3 with fluent speech, no focal motor/sensory deficits Breasts: Breast inspection showed them to be symmetrical with no nipple discharge. Palpation of the breasts and axilla revealed no obvious mass that I could appreciate.   LABORATORY DATA:  I have reviewed the data as listed CBC Latest Ref Rng & Units 11/05/2020 08/07/2020 05/01/2020  WBC 4.0 - 10.5 K/uL 7.5 7.0 5.9  Hemoglobin 12.0 - 15.0 g/dL 12.3 12.4 11.5(L)  Hematocrit 36.0 - 46.0 % 38.9 39.5 36.4  Platelets 150 - 400 K/uL 223 246 211  CMP Latest Ref Rng & Units 11/05/2020 08/07/2020 05/01/2020  Glucose 70 - 99 mg/dL 94 86 71  BUN 6 - 20 mg/dL $Remove'11 15 14  'KagJLyp$ Creatinine 0.44 - 1.00 mg/dL 0.68 0.76 0.74  Sodium 135 - 145 mmol/L 140 141 142  Potassium 3.5 - 5.1 mmol/L 3.9 3.5 3.6  Chloride 98 - 111 mmol/L 101 102 104  CO2 22 - 32 mmol/L $RemoveB'29 30 30  'zSeyujPM$ Calcium 8.9 - 10.3 mg/dL 9.6 9.6 9.5  Total Protein 6.5 - 8.1 g/dL 7.2 7.6 7.1  Total Bilirubin 0.3 - 1.2 mg/dL 0.3 0.3 0.3  Alkaline Phos 38 - 126 U/L 77 72 65  AST 15 - 41 U/L 15 14(L) 15  ALT 0 - 44 U/L $Remo'11 12 10      'RaIMg$ RADIOGRAPHIC STUDIES: I have personally reviewed the radiological images as listed and agreed with the findings in the report. No results found.   ASSESSMENT & PLAN:  Felicia Bauer is a 60 y.o. female with    1. Malignant  neoplasm of upper-outer quadrant of right breast, invasive ductal carcinoma, stage IA, pT2N0M0, ER+/PR +, HER2 -, Grade I, and DCIS ER+/PR+, RS 12 -She was diagnosed in 03/2017. She is s/p right breast lumpectomy with SLNB, breast reconstruction and adjuvant radiation.RS 12 with no benefit of chemotherapy. -She started anti-estrogen therapy with anastrozole in 08/2017.Held from 12/26/18-04/2019 due to significantcervical radiculopathy andjoint pain in hands. Managing with hot flashes on Effexor and Gabapentin.  -We discussed switching to tamoxifen or exemestane, she did not think her arthralgias are related to anastrozole, and would like to continue. -She is clinically doing well. Lab reviewed, her CBC and CMP are within normal limits.  Mild anemia resolved.  Her physical exam and her 03/2020 mammogram were unremarkable. There is no clinical concern for recurrence. -Continue surveillance. Next Mammogram in 03/2021. I reviewed what concerning symptoms to watch for.  -Continue Anastrozole  -F/u in 6 months    2.Cervical radiculopathy  -Her neck pain and the neuropathyin hands haveresolved after surgery,right knee pain is mild and stable  3. Hypertension, GERD, anxiety -Follow-up with primary care physician  4. Hot flash  -on Effexor150 mg daily and Gabapentin $RemoveBefore'600mg'MfEUGNWOJOaHX$  (from her prior neuropathy) due to persistent severe hot flash.  -Her hot flashes are moderate to significant but manageable and don't impeded daily function.    5. Bone health -04/2018 DEXA shows normal bone health with lowest T-score of -0.1 at right hip. -Wepreviouslydiscussed the potential adverse impact of anastrozole for her bone density. I encouraged her to take calcium and vitamin D.DEXA scheduled for 11/10/20.   6. Mild Anemia  -Hg 11.5 today (05/01/20). Per pt she had prior history of mild anemia.  -I recommend she start multivitamin. I will check with her GI about next colonoscopy.  -repeat lab in 3  months   7. Obesity -She is interested in weight loss, will refer her to weight management clinic  PLAN: -Continue anastrozole  -DEXA on 11/10/20, will call her with result  -Lab and f/u in6 months -Refer her to weight management clinic -Diagnostic mammogram in 03/2021   No problem-specific Assessment & Plan notes found for this encounter.   Orders Placed This Encounter  Procedures  . MM DIAG BREAST TOMO BILATERAL    Standing Status:   Future    Standing Expiration Date:   11/05/2021    Order Specific Question:   Reason for Exam (SYMPTOM  OR DIAGNOSIS REQUIRED)    Answer:   screening  Order Specific Question:   Preferred imaging location?    Answer:   [facility]    Order Specific Question:   Is the patient pregnant?    Answer:   No  . Amb Ref to Medical Weight Management    Referral Priority:   Routine    Referral Type:   Consultation    Number of Visits Requested:   1   All questions were answered. The patient knows to call the clinic with any problems, questions or concerns. No barriers to learning was detected. The total time spent in the appointment was 30 minutes.     Truitt Merle, MD 11/05/2020   I, Joslyn Devon, am acting as scribe for Truitt Merle, MD.   I have reviewed the above documentation for accuracy and completeness, and I agree with the above.

## 2020-11-05 ENCOUNTER — Telehealth: Payer: Self-pay | Admitting: Hematology

## 2020-11-05 ENCOUNTER — Other Ambulatory Visit: Payer: Self-pay

## 2020-11-05 ENCOUNTER — Encounter: Payer: Self-pay | Admitting: Hematology

## 2020-11-05 ENCOUNTER — Inpatient Hospital Stay (HOSPITAL_BASED_OUTPATIENT_CLINIC_OR_DEPARTMENT_OTHER): Payer: 59 | Admitting: Hematology

## 2020-11-05 ENCOUNTER — Inpatient Hospital Stay: Payer: 59 | Attending: Hematology

## 2020-11-05 VITALS — BP 148/68 | HR 72 | Temp 97.6°F | Resp 18 | Ht 61.0 in | Wt 195.5 lb

## 2020-11-05 DIAGNOSIS — R232 Flushing: Secondary | ICD-10-CM | POA: Insufficient documentation

## 2020-11-05 DIAGNOSIS — Z79899 Other long term (current) drug therapy: Secondary | ICD-10-CM | POA: Diagnosis not present

## 2020-11-05 DIAGNOSIS — C50411 Malignant neoplasm of upper-outer quadrant of right female breast: Secondary | ICD-10-CM | POA: Insufficient documentation

## 2020-11-05 DIAGNOSIS — E669 Obesity, unspecified: Secondary | ICD-10-CM | POA: Insufficient documentation

## 2020-11-05 DIAGNOSIS — Z170 Estrogen receptor positive status [ER+]: Secondary | ICD-10-CM | POA: Diagnosis not present

## 2020-11-05 DIAGNOSIS — F419 Anxiety disorder, unspecified: Secondary | ICD-10-CM | POA: Insufficient documentation

## 2020-11-05 DIAGNOSIS — I10 Essential (primary) hypertension: Secondary | ICD-10-CM | POA: Diagnosis not present

## 2020-11-05 DIAGNOSIS — Z79811 Long term (current) use of aromatase inhibitors: Secondary | ICD-10-CM | POA: Insufficient documentation

## 2020-11-05 DIAGNOSIS — M5412 Radiculopathy, cervical region: Secondary | ICD-10-CM | POA: Diagnosis not present

## 2020-11-05 DIAGNOSIS — Z923 Personal history of irradiation: Secondary | ICD-10-CM | POA: Insufficient documentation

## 2020-11-05 DIAGNOSIS — M25561 Pain in right knee: Secondary | ICD-10-CM | POA: Insufficient documentation

## 2020-11-05 DIAGNOSIS — K219 Gastro-esophageal reflux disease without esophagitis: Secondary | ICD-10-CM | POA: Insufficient documentation

## 2020-11-05 DIAGNOSIS — D649 Anemia, unspecified: Secondary | ICD-10-CM | POA: Insufficient documentation

## 2020-11-05 LAB — CBC WITH DIFFERENTIAL (CANCER CENTER ONLY)
Abs Immature Granulocytes: 0.02 10*3/uL (ref 0.00–0.07)
Basophils Absolute: 0 10*3/uL (ref 0.0–0.1)
Basophils Relative: 0 %
Eosinophils Absolute: 0.1 10*3/uL (ref 0.0–0.5)
Eosinophils Relative: 2 %
HCT: 38.9 % (ref 36.0–46.0)
Hemoglobin: 12.3 g/dL (ref 12.0–15.0)
Immature Granulocytes: 0 %
Lymphocytes Relative: 36 %
Lymphs Abs: 2.7 10*3/uL (ref 0.7–4.0)
MCH: 27.8 pg (ref 26.0–34.0)
MCHC: 31.6 g/dL (ref 30.0–36.0)
MCV: 87.8 fL (ref 80.0–100.0)
Monocytes Absolute: 0.6 10*3/uL (ref 0.1–1.0)
Monocytes Relative: 8 %
Neutro Abs: 4 10*3/uL (ref 1.7–7.7)
Neutrophils Relative %: 54 %
Platelet Count: 223 10*3/uL (ref 150–400)
RBC: 4.43 MIL/uL (ref 3.87–5.11)
RDW: 13.3 % (ref 11.5–15.5)
WBC Count: 7.5 10*3/uL (ref 4.0–10.5)
nRBC: 0 % (ref 0.0–0.2)

## 2020-11-05 LAB — CMP (CANCER CENTER ONLY)
ALT: 11 U/L (ref 0–44)
AST: 15 U/L (ref 15–41)
Albumin: 4.2 g/dL (ref 3.5–5.0)
Alkaline Phosphatase: 77 U/L (ref 38–126)
Anion gap: 10 (ref 5–15)
BUN: 11 mg/dL (ref 6–20)
CO2: 29 mmol/L (ref 22–32)
Calcium: 9.6 mg/dL (ref 8.9–10.3)
Chloride: 101 mmol/L (ref 98–111)
Creatinine: 0.68 mg/dL (ref 0.44–1.00)
GFR, Estimated: >60 mL/min (ref >60)
Glucose, Bld: 94 mg/dL (ref 70–99)
Potassium: 3.9 mmol/L (ref 3.5–5.1)
Sodium: 140 mmol/L (ref 135–145)
Total Bilirubin: 0.3 mg/dL (ref 0.3–1.2)
Total Protein: 7.2 g/dL (ref 6.5–8.1)

## 2020-11-05 MED ORDER — VENLAFAXINE HCL ER 150 MG PO CP24: 150.0000 mg | ORAL_CAPSULE | Freq: Every day | ORAL | 8 refills | Status: DC

## 2020-11-05 NOTE — Telephone Encounter (Signed)
Scheduled per los. Confirmed appt. Declined printout  

## 2020-11-06 ENCOUNTER — Inpatient Hospital Stay: Payer: 59 | Admitting: Hematology

## 2020-11-06 ENCOUNTER — Inpatient Hospital Stay: Payer: 59

## 2020-11-10 ENCOUNTER — Other Ambulatory Visit: Payer: Self-pay

## 2020-11-10 ENCOUNTER — Ambulatory Visit
Admission: RE | Admit: 2020-11-10 | Discharge: 2020-11-10 | Disposition: A | Discharge status: Home / Self Care | Payer: 59 | Source: Ambulatory Visit | Attending: Hematology | Admitting: Hematology

## 2020-11-10 DIAGNOSIS — E2839 Other primary ovarian failure: Secondary | ICD-10-CM

## 2020-12-01 ENCOUNTER — Encounter: Payer: Self-pay | Admitting: Hematology

## 2021-04-07 ENCOUNTER — Ambulatory Visit
Admission: RE | Admit: 2021-04-07 | Discharge: 2021-04-07 | Disposition: A | Discharge status: Home / Self Care | Payer: 59 | Source: Ambulatory Visit | Attending: Hematology | Admitting: Hematology

## 2021-04-07 ENCOUNTER — Other Ambulatory Visit: Payer: Self-pay | Admitting: Hematology

## 2021-04-07 ENCOUNTER — Other Ambulatory Visit: Payer: Self-pay

## 2021-04-07 DIAGNOSIS — C50411 Malignant neoplasm of upper-outer quadrant of right female breast: Secondary | ICD-10-CM

## 2021-05-12 NOTE — Progress Notes (Signed)
[facility] OFFICE PROGRESS NOTE  Orpah Melter, MD [address] [place] [address]  DIAGNOSIS:  F/u of right breast cancer   Oncology History Overview Note  Cancer Staging Malignant neoplasm of upper-outer quadrant of right breast, estrogen receptor positive ([place]) Staging form: Breast, AJCC 8th Edition - Clinical stage from 04/12/2017: Stage IIA (cT3, cN0, cM0, G1, ER: Positive, PR: Positive, HER2: Negative) - Signed by Truitt Merle, MD on 04/16/2017 - Pathologic: Stage IA (pT2, pN0(sn), cM0, G1, ER: Positive, PR: Positive, HER2: Negative, Oncotype DX score: 12) - Unsigned     Malignant neoplasm of upper-outer quadrant of right breast, estrogen receptor positive ([place])  04/07/2017 Mammogram   IMPRESSION: 1. Highly suspicious mass within the upper right breast, 12 to 12:30 o'clock axis, at posterior depth, with associated architectural distortion and pleomorphic calcifications. The mass measures 1 cm greatest dimension. The mass and associated microcalcifications measure 1.4 cm. No sonographic correlate identified. Recommend stereotactic biopsy using 3D tomosynthesis guidance. 2. Additional punctate and coarse heterogeneous calcifications within the upper outer quadrant of the right breast, 11-12 o'clock axis region, at middle depth, with a suspicious segmental distribution, spanning approximately 5 cm, the most suspicious calcifications best seen on the CC magnification views. Recommend additional stereotactic biopsy for these right breast calcifications to exclude multifocal/multicentric disease and/or define extent of disease.   04/12/2017 Initial Biopsy   Diagnosis 1. 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 - SEE COMMENT. 2. Breast, right, needle core biopsy, loosely grouped calcs in more central, upper outer  quadrant - DUCTAL CARCINOMA IN SITU WITH CALCIFICATIONS. - LOBULAR NEOPLASIA (ATYPICAL LOBULAR HYPERPLASIA). - FIBROCYSTIC CHANGES WITH ADENOSIS AND CALCIFICATIONS.   04/12/2017 Receptors her2   Estrogen Receptor: 95%, POSITIVE, STRONG STAINING INTENSITY Progesterone Receptor: 95%, POSITIVE, STRONG STAINING INTENSITY Proliferation Marker Ki67: 3% HER2: NEGATIVE   04/16/2017 Initial Diagnosis   Malignant neoplasm of upper-outer quadrant of right breast, estrogen receptor positive ([place])   04/21/2017 Mammogram   04/21/2017 Mammogram IMPRESSION: 1. 2.8 x 2.0 x 0.5 cm biopsy-proven invasive ductal carcinoma and ductal carcinoma in situ in the posterior aspect of the upper-outer quadrant of the right breast. 2. No mass or abnormal enhancement at the location of the recently biopsied low-grade ductal carcinoma in situ with calcifications and atypical lobular hyperplasia in the central aspect of the upper-outer quadrant of the right breast. 3. No evidence of malignancy on the left.   05/11/2017 Surgery   Right breast lumpectomy and SLN biopsy    05/11/2017 Pathology Results   Right lumpectomy showed a 2.1cm invasive ductal carcinoma, margins are negative, (+)ADH, 6 nodes are all negative    05/11/2017 Oncotype testing   RS 12, predicts 9-year distant recurrence 3% with tamoxifen or AI    05/26/2017 Surgery   Breast reduction by Dr. Iran Planas    05/26/2017 Pathology Results   Diagnosis 1. Breast, Mammoplasty, left - FIBROCYSTIC CHANGES WITH ADENOSIS AND CALCIFICATIONS. - THERE IS NO EVIDENCE OF MALIGNANCY. 2. Breast, Mammoplasty, right - FIBROCYSTIC CHANGES WITH ADENOSIS AND CALCIFICATIONS. - FAT NECROSIS. - THERE IS NO EVIDENCE OF MALIGNANCY.   07/06/2017 - 08/18/2017 Radiation Therapy   Adjuvant breast radiation with Dr. Lisbeth Renshaw   08/2017 -  Anti-estrogen oral therapy   Anastrozole 58m daily   04/02/2018 Mammogram   Mammogram 04/02/18 There are calcifications near the lateral  right lumpectomy site best seen on the  CC view. There are also calcifications located behind the nipple on the CC view, likely superiorly located on the 90 degree view near the site of the second lumpectomy. There are also new calcifications located in the posterior slightly medial left breast, best seen on the CC view.   Mammographic images were processed with CAD.   04/05/2018 Pathology Results   Diagnosis 04/05/18 1. Breast, left, needle core biopsy, upper inner - FIBROCYSTIC CHANGES AND ADENOSIS WITH CALCIFICATIONS - NO MALIGNANCY IDENTIFIED 2. Breast, right, needle core biopsy, upper central - FIBROCYSTIC CHANGES, ADENOSIS AND FAT NECROSIS WITH CALCIFICATIONS - NO MALIGNANCY IDENTIFIED 3. Breast, right, needle core biopsy, upper outer - FIBROCYSTIC CHANGES, ADENOSIS AND FAT NECROSIS WITH CALCIFICATIONS - NO MALIGNANCY IDENTIFIED     CURRENT THERAPY: Adjuvant Anastrozole 58m daily starting 08/2017.  Held from 12/26/18-04/2019 due to significant joint pain in hands  INTERVAL HISTORY: Felicia Doughty89y.o. female returns to the clinic today for a follow up visit. She was last seen in May 2022. She is feeling well today without any concerning complaints. She is up to date on her health screenings. She recently had a mammogram on 04/07/21 which did not show any suspicious findings. She denies any breast pain, lumps, nipple changes, or overlying skin changes. She denies any new bone pain. Denies headaches or visual changes. Denies shortness of breath, cough, or chest pain.   She is also up to date on her DEXA scan. She is taking calcium and vitamin D. She is due for her next DEXA scan on May 2024.   At her last appointment, she was referred to the healthy weight and weight loss clinic.  To her knowledge, she did not receive a phone call from their clinic.  Per chart review, it appears that the referral is closed and they attempted to call her x1.  She would still be interested in  the service.  She mentions that sometimes she has intermittent swelling bilaterally in her ankles which comes and goes.  This makes her have achiness in her ankles.  Denies any calf pain or erythema.  She is currently on treatment with anastrozole which she is tolerating well except for joint pain which is manageable. She is also on effexor and gabapentin for her hot flashes.   She is on s 900 mg daily of gabapentin.  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  She continues to take 150 mg of Effexor at nighttime.  She intermittently takes iron as needed for worsening anemia. She is here for evaluation and repeat blood work.    MEDICAL HISTORY: Past Medical History:  Diagnosis Date   Arthritis    knee    Breast cancer, right (HKiron 185/4627  Complication of anesthesia    hard to wake up post-op; itching after anesthesia   GERD (gastroesophageal reflux disease)    History of vertigo    Hypertension    states under control with med., has been on med. > 10 yr.   Mitral valve prolapse    Panic attacks    Personal history of radiation therapy    Wears partial dentures    upper    ALLERGIES:  has No Known Allergies.  MEDICATIONS:  Current Outpatient Medications  Medication Sig Dispense Refill   anastrozole (ARIMIDEX) 1 MG tablet TAKE 1 TABLET BY MOUTH EVERY DAY 30 tablet 11   aspirin 81 MG  EC tablet Take by mouth.     Cholecalciferol 50 MCG (2000 UT) CAPS Take by mouth.     clonazePAM (KLONOPIN) 1 MG tablet Take 0.5 mg by mouth as needed for anxiety.     diazepam (VALIUM) 2 MG tablet TAKE 2MG BY MOUTH DAILY AS NEEDED     diclofenac Sodium (VOLTAREN) 1 % GEL diclofenac 1 % topical gel  APPLY 2 3 TIMES DAILY AS NEEDED     fexofenadine-pseudoephedrine (ALLEGRA-D 24) 180-240 MG per 24 hr tablet Take 1 tablet by mouth daily as needed (for sinus).     gabapentin (NEURONTIN) 600 MG tablet Take  1 tablet (600 mg total) by mouth 3 (three) times daily. 100 tablet 2   HYDROcodone-acetaminophen (NORCO) 5-325 MG tablet Take 1-2 tablets by mouth every 4 (four) hours as needed for moderate pain. 30 tablet 0   lisinopril-hydrochlorothiazide (ZESTORETIC) 20-25 MG tablet lisinopril 20 mg-hydrochlorothiazide 25 mg tablet  TAKE 1 TABLET BY MOUTH EVERY DAY     Meclizine HCl 25 MG CHEW Chew by mouth.     pantoprazole (PROTONIX) 40 MG tablet Take 40 mg daily by mouth.  2   venlafaxine XR (EFFEXOR-XR) 150 MG 24 hr capsule Take 1 capsule (150 mg total) by mouth daily with breakfast. 30 capsule 8   vitamin E 180 MG (400 UNITS) capsule Take by mouth.     No current facility-administered medications for this visit.    SURGICAL HISTORY:  Past Surgical History:  Procedure Laterality Date   ANTERIOR CERVICAL DECOMP/DISCECTOMY FUSION N/A 05/24/2013   Procedure: CERVICAL FOUR-FIVE, CERVICAL FIVE-SIX, CERVICAL SIX-SEVEN ANTERIOR CERVICAL DECOMPRESSION/DISCECTOMY FUSION 3 LEVELS;  Surgeon: Faythe Ghee, MD;  Location: [facility];  Service: Neurosurgery;  Laterality: N/A;   ANTERIOR CERVICAL DECOMP/DISCECTOMY FUSION  10/27/2016   C3-4   BREAST BIOPSY Bilateral 03/2018   benign   BREAST LUMPECTOMY Right 2018   x 2   BREAST REDUCTION SURGERY Bilateral 05/26/2017   Procedure: LEFT BREAST MAMMARY REDUCTION  (BREAST) AND RIGHT ONCOPLASTIC RECONSTRUCTION;  Surgeon: Irene Limbo, MD;  Location: [facility];  Service: Plastics;  Laterality: Bilateral;   CESAREAN SECTION     x 2   DILATION AND CURETTAGE OF UTERUS  05/30/2008   ENDOMETRIAL ABLATION  05/30/2008   RADIOACTIVE SEED GUIDED PARTIAL MASTECTOMY WITH AXILLARY SENTINEL LYMPH NODE BIOPSY Right 05/11/2017   Procedure: RIGHT BRACKETED RADIOACTIVE SEED GUIDED PARTIAL MASTECTOMY WITH  SENTINEL LYMPH NODE BIOPSY WITH ERAS PATHWAY;  Surgeon: [name], MD;  Location: [facility];  Service: General;  Laterality: Right;  PEC  BLOCK   REDUCTION MAMMAPLASTY Bilateral    TUBAL LIGATION      REVIEW OF SYSTEMS:   Review of Systems  Constitutional: Positive for occasional hot flashes.  Negative for appetite change, chills, fatigue, fever and unexpected weight change.  HENT: Negative for mouth sores, nosebleeds, sore throat and trouble swallowing.   Eyes: Negative for eye problems and icterus.  Respiratory: Negative for cough, hemoptysis, shortness of breath and wheezing.   Cardiovascular: Negative for chest pain.  Mild intermittent bilateral ankle swelling. Gastrointestinal: Negative for abdominal pain, constipation, diarrhea, nausea and vomiting.  Genitourinary: Negative for bladder incontinence, difficulty urinating, dysuria, frequency and hematuria.   Musculoskeletal: Negative for back pain, gait problem, neck pain and neck stiffness.  Skin: Negative for itching and rash.  Neurological: Positive for baseline peripheral neuropathy in her hands bilaterally.  Negative for dizziness, extremity weakness, gait problem, headaches, light-headedness and seizures.  Hematological: Negative  for adenopathy. Does not bruise/bleed easily.  Psychiatric/Behavioral: Negative for confusion, depression and sleep disturbance. The patient is not nervous/anxious.     PHYSICAL EXAMINATION:  Blood pressure 138/75, pulse 70, temperature 97.6 F (36.4 C), temperature source Tympanic, resp. rate 20, height 5' 1" (1.549 m), weight 190 lb 12.8 oz (86.5 kg), SpO2 100 %.  ECOG PERFORMANCE STATUS: 1  Physical Exam  Constitutional: Oriented to person, place, and time and well-developed, well-nourished, and in no distress.  HENT:  Head: Normocephalic and atraumatic.  Mouth/Throat: Oropharynx is clear and moist. No oropharyngeal exudate.  Eyes: Conjunctivae are normal. Right eye exhibits no discharge. Left eye exhibits no discharge. No scleral icterus.  Neck: Normal range of motion. Neck supple.  Cardiovascular: Normal rate, regular rhythm,  normal heart sounds and intact distal pulses.   Pulmonary/Chest: Effort normal and breath sounds normal. No respiratory distress. No wheezes. No rales.  Abdominal: Soft. Bowel sounds are normal. Exhibits no distension and no mass. There is no tenderness.  Musculoskeletal: Normal range of motion.  Minimal/mild bilateral ankle swelling.  No calf pain or tenderness. Lymphadenopathy:    No cervical adenopathy.  Neurological: Alert and oriented to person, place, and time. Exhibits normal muscle tone. Gait normal. Coordination normal.  Skin: Skin is warm and dry. No rash noted. Not diaphoretic. No erythema. No pallor.  Psychiatric: Mood, memory and judgment normal.  Breasts: Breast inspection showed them to be symmetrical with no nipple discharge. Palpation of the breasts and axilla revealed no obvious mass that I could appreciate. Well healed scars from prior breast surgery noted.   Vitals reviewed.  LABORATORY DATA: Lab Results  Component Value Date   WBC 6.6 05/14/2021   HGB 11.6 (L) 05/14/2021   HCT 37.3 05/14/2021   MCV 89.0 05/14/2021   PLT 228 05/14/2021      Chemistry      Component Value Date/Time   NA 142 05/14/2021 1252   NA 142 04/19/2017 0848   K 3.4 (L) 05/14/2021 1252   K 3.7 04/19/2017 0848   CL 105 05/14/2021 1252   CO2 29 05/14/2021 1252   CO2 28 04/19/2017 0848   BUN 17 05/14/2021 1252   BUN 17.2 04/19/2017 0848   CREATININE 0.74 05/14/2021 1252   CREATININE 0.8 04/19/2017 0848      Component Value Date/Time   CALCIUM 9.3 05/14/2021 1252   CALCIUM 9.8 04/19/2017 0848   ALKPHOS 69 05/14/2021 1252   ALKPHOS 58 04/19/2017 0848   AST 15 05/14/2021 1252   AST 17 04/19/2017 0848   ALT 13 05/14/2021 1252   ALT 12 04/19/2017 0848   BILITOT 0.3 05/14/2021 1252   BILITOT 0.47 04/19/2017 0848       RADIOGRAPHIC STUDIES:  No results found.   ASSESSMENT/PLAN:  Felicia Bauer is a 60 y.o. female with  1. Malignant neoplasm of upper-outer quadrant of  right breast, invasive ductal carcinoma, stage IA, pT2N0M0, ER+/PR +, HER2 -, Grade I, and DCIS ER+/PR+, RS 12 -She was diagnosed in 03/2017. She is s/p right breast lumpectomy with SLNB, breast reconstruction and adjuvant radiation. RS 12 with no benefit of chemotherapy.  -She started anti-estrogen therapy with anastrozole in 08/2017. Held from 12/26/18-04/2019 due to significant cervical radiculopathy and joint pain in hands. Managing with hot flashes on Effexor and Gabapentin.  -Dr. Burr Medico previously discussed switching to tamoxifen or exemestane, she did not think her arthralgias are related to anastrozole, and would like to continue. -She is clinically doing well. Lab reviewed, her  CBC and CMP are within normal limits except for hemoglobin slightly low at 11.6.  Potassium also slightly low at 3.4.  She will resume taking her iron supplements and was encouraged to increase her potassium intake in her diet on returning home today.  Her physical exam and her 03/2021 mammogram were unremarkable. There is no clinical concern for recurrence. -Continue surveillance. Reviewed 10/22 mammogram. Next Mammogram in 03/2022.  -Continue Anastrozole  -F/u in 6 months   2. Cervical radiculopathy  -Her neck pain resolved after surgery, right knee pain is mild and stable.  Has stable peripheral neuropathy in her hands.  Presently on gabapentin.   3. Hypertension, GERD, anxiety -Follow-up with primary care physician   4. Hot flash  -on Effexor 150 mg daily and Gabapentin 900 mg total daily (from her prior neuropathy) due to persistent severe hot flash.   -Her hot flashes are moderate to significant but manageable and don't impeded daily function.     5. Bone health -04/2018 DEXA shows normal bone health with lowest T-score of -0.1 at right hip.  -Dr. Burr Medico previously discussed the potential adverse impact of anastrozole for her bone density. She encouraged her to take calcium and vitamin D which she is presently  taking. DEXA on 11/10/20. Next Dexa due in 10/2022    6. Mild Anemia  -Hg 11.6  today (05/14/21). Per pt she had prior history of mild anemia.  She will start taking her vitamin and iron supplement upon returning home today. -Dr. Burr Medico previously recommend she start multivitamin.   7. Obesity -She is interested in weight loss, Dr. Burr Medico previously referred her to weight management clinic but the patient denies receiving a phone call.  She would still be interested in the service -Reentered referral today.  8. Hypokalemia -Mild hypokalemia with potassium of 3.4 today. -Patient advised to eat banana or other potassium rich food upon returning home today.  9.  Intermittent/mild ankle swelling bilaterally -Patient mentions today she sometimes has mild bilateral ankle swelling that comes and goes. -Exam shows minimal/mild ankle swelling today without any calf pain/erythema, or tenderness.  Encouraged the patient to reduce her intake of foods high in salt, elevate her lower extremities, and use compression stockings if needed.  If continues to be a concern, recommended she follow-up with her primary care provider.    PLAN:  -Continue anastrozole  -Lab and f/u in 6 months  -Diagnostic mammogram in 03/2022 -We will restart her multivitamin and iron supplement -Follow-up on referral to medical weight management center.      Orders Placed This Encounter  Procedures   Amb Ref to Medical Weight Management    Referral Priority:   Routine    Referral Type:   Consultation    Number of Visits Requested:   1    The total time spent in the appointment was 20-29 minutes.   Taaliyah Delpriore L Jini Horiuchi, PA-C 05/14/21

## 2021-05-14 ENCOUNTER — Other Ambulatory Visit: Payer: Self-pay | Admitting: Physician Assistant

## 2021-05-14 ENCOUNTER — Inpatient Hospital Stay: Payer: 59 | Attending: Hematology | Admitting: Physician Assistant

## 2021-05-14 ENCOUNTER — Other Ambulatory Visit: Payer: Self-pay

## 2021-05-14 ENCOUNTER — Inpatient Hospital Stay: Payer: 59

## 2021-05-14 VITALS — BP 138/75 | HR 70 | Temp 97.6°F | Resp 20 | Ht 61.0 in | Wt 190.8 lb

## 2021-05-14 DIAGNOSIS — G629 Polyneuropathy, unspecified: Secondary | ICD-10-CM | POA: Diagnosis not present

## 2021-05-14 DIAGNOSIS — Z170 Estrogen receptor positive status [ER+]: Secondary | ICD-10-CM | POA: Diagnosis not present

## 2021-05-14 DIAGNOSIS — D649 Anemia, unspecified: Secondary | ICD-10-CM | POA: Diagnosis not present

## 2021-05-14 DIAGNOSIS — E669 Obesity, unspecified: Secondary | ICD-10-CM | POA: Diagnosis not present

## 2021-05-14 DIAGNOSIS — R634 Abnormal weight loss: Secondary | ICD-10-CM | POA: Diagnosis not present

## 2021-05-14 DIAGNOSIS — M25471 Effusion, right ankle: Secondary | ICD-10-CM | POA: Diagnosis not present

## 2021-05-14 DIAGNOSIS — E876 Hypokalemia: Secondary | ICD-10-CM | POA: Diagnosis not present

## 2021-05-14 DIAGNOSIS — I10 Essential (primary) hypertension: Secondary | ICD-10-CM | POA: Diagnosis not present

## 2021-05-14 DIAGNOSIS — R232 Flushing: Secondary | ICD-10-CM | POA: Insufficient documentation

## 2021-05-14 DIAGNOSIS — M2550 Pain in unspecified joint: Secondary | ICD-10-CM | POA: Diagnosis not present

## 2021-05-14 DIAGNOSIS — E663 Overweight: Secondary | ICD-10-CM | POA: Diagnosis not present

## 2021-05-14 DIAGNOSIS — Z79811 Long term (current) use of aromatase inhibitors: Secondary | ICD-10-CM | POA: Insufficient documentation

## 2021-05-14 DIAGNOSIS — M25472 Effusion, left ankle: Secondary | ICD-10-CM | POA: Insufficient documentation

## 2021-05-14 DIAGNOSIS — M5412 Radiculopathy, cervical region: Secondary | ICD-10-CM | POA: Insufficient documentation

## 2021-05-14 DIAGNOSIS — C50411 Malignant neoplasm of upper-outer quadrant of right female breast: Secondary | ICD-10-CM | POA: Diagnosis present

## 2021-05-14 DIAGNOSIS — K219 Gastro-esophageal reflux disease without esophagitis: Secondary | ICD-10-CM | POA: Insufficient documentation

## 2021-05-14 DIAGNOSIS — Z923 Personal history of irradiation: Secondary | ICD-10-CM | POA: Insufficient documentation

## 2021-05-14 DIAGNOSIS — F419 Anxiety disorder, unspecified: Secondary | ICD-10-CM | POA: Diagnosis not present

## 2021-05-14 DIAGNOSIS — Z79899 Other long term (current) drug therapy: Secondary | ICD-10-CM | POA: Diagnosis not present

## 2021-05-14 LAB — CMP (CANCER CENTER ONLY)
ALT: 13 U/L (ref 0–44)
AST: 15 U/L (ref 15–41)
Albumin: 4.2 g/dL (ref 3.5–5.0)
Alkaline Phosphatase: 69 U/L (ref 38–126)
Anion gap: 8 (ref 5–15)
BUN: 17 mg/dL (ref 6–20)
CO2: 29 mmol/L (ref 22–32)
Calcium: 9.3 mg/dL (ref 8.9–10.3)
Chloride: 105 mmol/L (ref 98–111)
Creatinine: 0.74 mg/dL (ref 0.44–1.00)
GFR, Estimated: >60 mL/min (ref >60)
Glucose, Bld: 78 mg/dL (ref 70–99)
Potassium: 3.4 mmol/L — ABNORMAL LOW (ref 3.5–5.1)
Sodium: 142 mmol/L (ref 135–145)
Total Bilirubin: 0.3 mg/dL (ref 0.3–1.2)
Total Protein: 7.1 g/dL (ref 6.5–8.1)

## 2021-05-14 LAB — CBC WITH DIFFERENTIAL (CANCER CENTER ONLY)
Abs Immature Granulocytes: 0 10*3/uL (ref 0.00–0.07)
Basophils Absolute: 0 10*3/uL (ref 0.0–0.1)
Basophils Relative: 1 %
Eosinophils Absolute: 0.2 10*3/uL (ref 0.0–0.5)
Eosinophils Relative: 2 %
HCT: 37.3 % (ref 36.0–46.0)
Hemoglobin: 11.6 g/dL — ABNORMAL LOW (ref 12.0–15.0)
Immature Granulocytes: 0 %
Lymphocytes Relative: 41 %
Lymphs Abs: 2.7 10*3/uL (ref 0.7–4.0)
MCH: 27.7 pg (ref 26.0–34.0)
MCHC: 31.1 g/dL (ref 30.0–36.0)
MCV: 89 fL (ref 80.0–100.0)
Monocytes Absolute: 0.7 10*3/uL (ref 0.1–1.0)
Monocytes Relative: 11 %
Neutro Abs: 3 10*3/uL (ref 1.7–7.7)
Neutrophils Relative %: 45 %
Platelet Count: 228 10*3/uL (ref 150–400)
RBC: 4.19 MIL/uL (ref 3.87–5.11)
RDW: 13 % (ref 11.5–15.5)
WBC Count: 6.6 10*3/uL (ref 4.0–10.5)
nRBC: 0 % (ref 0.0–0.2)

## 2021-05-21 ENCOUNTER — Telehealth: Payer: Self-pay | Admitting: Hematology

## 2021-05-21 NOTE — Telephone Encounter (Signed)
Sch per 12/2 los, unable to leave msg, mailed calendar

## 2021-07-05 ENCOUNTER — Encounter: Payer: Self-pay | Admitting: Hematology

## 2021-11-01 ENCOUNTER — Other Ambulatory Visit: Payer: Self-pay | Admitting: Hematology

## 2021-11-01 DIAGNOSIS — Z170 Estrogen receptor positive status [ER+]: Secondary | ICD-10-CM

## 2021-11-04 ENCOUNTER — Telehealth: Payer: Self-pay | Admitting: Hematology

## 2021-11-04 NOTE — Telephone Encounter (Signed)
Rescheduled appointment per providers PAL Left message.

## 2021-11-12 ENCOUNTER — Ambulatory Visit: Payer: 59 | Admitting: Hematology

## 2021-11-12 ENCOUNTER — Other Ambulatory Visit: Payer: 59

## 2021-12-02 ENCOUNTER — Other Ambulatory Visit: Payer: Self-pay | Admitting: Hematology

## 2021-12-10 ENCOUNTER — Inpatient Hospital Stay: Payer: 59 | Attending: Hematology

## 2021-12-10 ENCOUNTER — Encounter: Payer: Self-pay | Admitting: Hematology

## 2021-12-10 ENCOUNTER — Other Ambulatory Visit: Payer: Self-pay

## 2021-12-10 ENCOUNTER — Inpatient Hospital Stay (HOSPITAL_BASED_OUTPATIENT_CLINIC_OR_DEPARTMENT_OTHER): Payer: 59 | Admitting: Hematology

## 2021-12-10 VITALS — BP 128/78 | HR 69 | Temp 98.1°F | Resp 17 | Wt 191.7 lb

## 2021-12-10 DIAGNOSIS — I890 Lymphedema, not elsewhere classified: Secondary | ICD-10-CM | POA: Diagnosis not present

## 2021-12-10 DIAGNOSIS — F419 Anxiety disorder, unspecified: Secondary | ICD-10-CM | POA: Insufficient documentation

## 2021-12-10 DIAGNOSIS — I10 Essential (primary) hypertension: Secondary | ICD-10-CM | POA: Diagnosis not present

## 2021-12-10 DIAGNOSIS — Z923 Personal history of irradiation: Secondary | ICD-10-CM | POA: Diagnosis not present

## 2021-12-10 DIAGNOSIS — R232 Flushing: Secondary | ICD-10-CM | POA: Insufficient documentation

## 2021-12-10 DIAGNOSIS — M7989 Other specified soft tissue disorders: Secondary | ICD-10-CM | POA: Insufficient documentation

## 2021-12-10 DIAGNOSIS — K219 Gastro-esophageal reflux disease without esophagitis: Secondary | ICD-10-CM | POA: Diagnosis not present

## 2021-12-10 DIAGNOSIS — C50411 Malignant neoplasm of upper-outer quadrant of right female breast: Secondary | ICD-10-CM | POA: Diagnosis present

## 2021-12-10 DIAGNOSIS — Z170 Estrogen receptor positive status [ER+]: Secondary | ICD-10-CM | POA: Diagnosis not present

## 2021-12-10 DIAGNOSIS — Z79899 Other long term (current) drug therapy: Secondary | ICD-10-CM | POA: Diagnosis not present

## 2021-12-10 DIAGNOSIS — Z79811 Long term (current) use of aromatase inhibitors: Secondary | ICD-10-CM | POA: Diagnosis not present

## 2021-12-10 DIAGNOSIS — E669 Obesity, unspecified: Secondary | ICD-10-CM | POA: Insufficient documentation

## 2021-12-10 LAB — CBC WITH DIFFERENTIAL (CANCER CENTER ONLY)
Abs Immature Granulocytes: 0.01 10*3/uL (ref 0.00–0.07)
Basophils Absolute: 0 10*3/uL (ref 0.0–0.1)
Basophils Relative: 0 %
Eosinophils Absolute: 0.1 10*3/uL (ref 0.0–0.5)
Eosinophils Relative: 2 %
HCT: 37.1 % (ref 36.0–46.0)
Hemoglobin: 12 g/dL (ref 12.0–15.0)
Immature Granulocytes: 0 %
Lymphocytes Relative: 34 %
Lymphs Abs: 2.2 10*3/uL (ref 0.7–4.0)
MCH: 27.9 pg (ref 26.0–34.0)
MCHC: 32.3 g/dL (ref 30.0–36.0)
MCV: 86.3 fL (ref 80.0–100.0)
Monocytes Absolute: 0.7 10*3/uL (ref 0.1–1.0)
Monocytes Relative: 10 %
Neutro Abs: 3.6 10*3/uL (ref 1.7–7.7)
Neutrophils Relative %: 54 %
Platelet Count: 218 10*3/uL (ref 150–400)
RBC: 4.3 MIL/uL (ref 3.87–5.11)
RDW: 13.2 % (ref 11.5–15.5)
WBC Count: 6.6 10*3/uL (ref 4.0–10.5)
nRBC: 0 % (ref 0.0–0.2)

## 2021-12-10 LAB — CMP (CANCER CENTER ONLY)
ALT: 11 U/L (ref 0–44)
AST: 15 U/L (ref 15–41)
Albumin: 4.3 g/dL (ref 3.5–5.0)
Alkaline Phosphatase: 58 U/L (ref 38–126)
Anion gap: 5 (ref 5–15)
BUN: 11 mg/dL (ref 6–20)
CO2: 29 mmol/L (ref 22–32)
Calcium: 9.7 mg/dL (ref 8.9–10.3)
Chloride: 104 mmol/L (ref 98–111)
Creatinine: 0.64 mg/dL (ref 0.44–1.00)
GFR, Estimated: >60 mL/min (ref >60)
Glucose, Bld: 86 mg/dL (ref 70–99)
Potassium: 3.9 mmol/L (ref 3.5–5.1)
Sodium: 138 mmol/L (ref 135–145)
Total Bilirubin: 0.3 mg/dL (ref 0.3–1.2)
Total Protein: 6.9 g/dL (ref 6.5–8.1)

## 2021-12-10 NOTE — Progress Notes (Signed)
[place]   [phone] [phone]   Clinic Follow up Note   Patient Care Team: Orpah Melter, MD as PCP - General (Family Medicine) Truitt Merle, MD as Consulting Physician (Hematology) [name], MD as Consulting Physician (General Surgery) Kyung Rudd, MD as Consulting Physician (Radiation Oncology) Irene Limbo, MD as Consulting Physician (Plastic Surgery) Delice Bison, Charlestine Massed, NP as Nurse Practitioner (Hematology and Oncology) Garth Bigness (Inactive) as Physician Assistant (Physician Assistant)  Date of Service:  12/10/2021  CHIEF COMPLAINT: f/u of right breast cancer  CURRENT THERAPY:  Anastrozole, starting 08/2017, held 12/26/18 - 04/2019 for joint pain  ASSESSMENT & PLAN:  Felicia Bauer is a 61 y.o. post-menopausal female with   1. Malignant neoplasm of upper-outer quadrant of right breast, invasive ductal carcinoma, stage IA, pT2N0M0, ER+/PR +, HER2 -, Grade I, and DCIS ER+/PR+, RS 12 -diagnosed in 03/2017. S/p right breast lumpectomy with SLNB, breast reconstruction and adjuvant radiation. RS 12, low risk. -She started anti-estrogen therapy with anastrozole in 08/2017. Held from 12/26/18-04/2019 due to significant cervical radiculopathy and joint pain in hands, restarted with no improvement in pain. Managing hot flashes with Effexor and Gabapentin. Plan for 5 years. -most recent mammogram 04/07/21 was benign. -she is clinically stable. Labs reviewed, all WNL. Physical exam was unremarkable except for some lymphedema. There is no clinical concern for recurrence. -Continue surveillance and anastrozole. Next Mammogram in 03/2022.  -F/u in 6 months    2. Lymphedema -she has some mild right arm swelling. I will refer her to physical therapy.  3. Hot flashes -on gabapentin and Effexor 150 mg daily due to persistent severe hot flash.   -I gave her sample of Tempo for her hot flash.   4. Bone health -DEXA on  11/10/20 was normal, T-score -0.4. -we previously discussed the potential adverse impact of anastrozole for her bone density.  -continue calcium and vitamin D -plan for repeat DEXA 11/2022.  5. Comorbiditied: HTN, GERD, anxiety, obesity, s/p Cervical laminectomy -Follow-up with primary care physician     PLAN:  -Continue anastrozole  -referral to physical therapy -Diagnostic mammogram in 03/2022 -Lab and f/u in 6 months    No problem-specific Assessment & Plan notes found for this encounter.   SUMMARY OF ONCOLOGIC HISTORY: Oncology History Overview Note  Cancer Staging Malignant neoplasm of upper-outer quadrant of right breast, estrogen receptor positive ([place]) Staging form: Breast, AJCC 8th Edition - Clinical stage from 04/12/2017: Stage IIA (cT3, cN0, cM0, G1, ER: Positive, PR: Positive, HER2: Negative) - Signed by Truitt Merle, MD on 04/16/2017 - Pathologic: Stage IA (pT2, pN0(sn), cM0, G1, ER: Positive, PR: Positive, HER2: Negative, Oncotype DX score: 12) - Unsigned     Malignant neoplasm of upper-outer quadrant of right breast, estrogen receptor positive ([place])  04/07/2017 Mammogram   IMPRESSION: 1. Highly suspicious mass within the upper right breast, 12 to 12:30 o'clock axis, at posterior depth, with associated architectural distortion and pleomorphic calcifications. The mass measures 1 cm greatest dimension. The mass and associated microcalcifications measure 1.4 cm. No sonographic correlate identified. Recommend stereotactic biopsy using 3D tomosynthesis guidance. 2. Additional punctate and coarse heterogeneous calcifications within the upper outer quadrant of the right breast, 11-12 o'clock axis region, at middle depth, with a suspicious segmental distribution, spanning approximately 5 cm, the most suspicious calcifications best seen on the CC magnification views. Recommend additional stereotactic biopsy for these right breast calcifications to exclude  multifocal/multicentric disease and/or define extent of disease.   04/12/2017 Initial  Biopsy   Diagnosis 1. 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 - SEE COMMENT. 2. Breast, right, needle core biopsy, loosely grouped calcs in more central, upper outer quadrant - DUCTAL CARCINOMA IN SITU WITH CALCIFICATIONS. - LOBULAR NEOPLASIA (ATYPICAL LOBULAR HYPERPLASIA). - FIBROCYSTIC CHANGES WITH ADENOSIS AND CALCIFICATIONS.   04/12/2017 Receptors her2   Estrogen Receptor: 95%, POSITIVE, STRONG STAINING INTENSITY Progesterone Receptor: 95%, POSITIVE, STRONG STAINING INTENSITY Proliferation Marker Ki67: 3% HER2: NEGATIVE   04/16/2017 Initial Diagnosis   Malignant neoplasm of upper-outer quadrant of right breast, estrogen receptor positive ([place])   04/21/2017 Mammogram   04/21/2017 Mammogram IMPRESSION: 1. 2.8 x 2.0 x 0.5 cm biopsy-proven invasive ductal carcinoma and ductal carcinoma in situ in the posterior aspect of the upper-outer quadrant of the right breast. 2. No mass or abnormal enhancement at the location of the recently biopsied low-grade ductal carcinoma in situ with calcifications and atypical lobular hyperplasia in the central aspect of the upper-outer quadrant of the right breast. 3. No evidence of malignancy on the left.   05/11/2017 Surgery   Right breast lumpectomy and SLN biopsy    05/11/2017 Pathology Results   Right lumpectomy showed a 2.1cm invasive ductal carcinoma, margins are negative, (+)ADH, 6 nodes are all negative    05/11/2017 Oncotype testing   RS 12, predicts 9-year distant recurrence 3% with tamoxifen or AI    05/26/2017 Surgery   Breast reduction by Dr. Iran Planas    05/26/2017 Pathology Results   Diagnosis 1. Breast, Mammoplasty, left - FIBROCYSTIC CHANGES WITH ADENOSIS AND CALCIFICATIONS. - THERE IS NO EVIDENCE OF  MALIGNANCY. 2. Breast, Mammoplasty, right - FIBROCYSTIC CHANGES WITH ADENOSIS AND CALCIFICATIONS. - FAT NECROSIS. - THERE IS NO EVIDENCE OF MALIGNANCY.   07/06/2017 - 08/18/2017 Radiation Therapy   Adjuvant breast radiation with Dr. Lisbeth Renshaw   08/2017 -  Anti-estrogen oral therapy   Anastrozole 46m daily   04/02/2018 Mammogram   Mammogram 04/02/18 There are calcifications near the lateral right lumpectomy site best seen on the CC view. There are also calcifications located behind the nipple on the CC view, likely superiorly located on the 90 degree view near the site of the second lumpectomy. There are also new calcifications located in the posterior slightly medial left breast, best seen on the CC view.   Mammographic images were processed with CAD.   04/05/2018 Pathology Results   Diagnosis 04/05/18 1. Breast, left, needle core biopsy, upper inner - FIBROCYSTIC CHANGES AND ADENOSIS WITH CALCIFICATIONS - NO MALIGNANCY IDENTIFIED 2. Breast, right, needle core biopsy, upper central - FIBROCYSTIC CHANGES, ADENOSIS AND FAT NECROSIS WITH CALCIFICATIONS - NO MALIGNANCY IDENTIFIED 3. Breast, right, needle core biopsy, upper outer - FIBROCYSTIC CHANGES, ADENOSIS AND FAT NECROSIS WITH CALCIFICATIONS - NO MALIGNANCY IDENTIFIED      INTERVAL HISTORY:  AChrisy Hillebrandis here for a follow up of breast cancer. She was last seen by PA Cassie on 05/14/21. She presents to the clinic alone. She reports she is having some right arm swelling (same side as breast surgery). She reports intense hot flashes from anastrozole which interrupt her sleep. She explains she will wake up soaked in sweat.   All other systems were reviewed with the patient and are negative.  MEDICAL HISTORY:  Past Medical History:  Diagnosis Date   Arthritis    knee    [place]ast [place], [id number]  Complication of anesthesia  hard to wake up post-op; itching after anesthesia   GERD (gastroesophageal  reflux disease)    History of vertigo    Hypertension    states under control with med., has been on med. > 10 yr.   Mitral valve prolapse    Panic attacks    Personal history of radiation therapy    Wears partial dentures    upper    SURGICAL HISTORY: Past Surgical History:  Procedure Laterality Date   ANTERIOR CERVICAL DECOMP/DISCECTOMY FUSION N/A 05/24/2013   Procedure: CERVICAL FOUR-FIVE, CERVICAL FIVE-SIX, CERVICAL SIX-SEVEN ANTERIOR CERVICAL DECOMPRESSION/DISCECTOMY FUSION 3 LEVELS;  Surgeon: Faythe Ghee, MD;  Location: [facility];  Service: Neurosurgery;  Laterality: N/A;   ANTERIOR CERVICAL DECOMP/DISCECTOMY FUSION  10/27/2016   C3-4   BREAST BIOPSY Bilateral 03/2018   benign   BREAST LUMPECTOMY Right 2018   x 2   BREAST REDUCTION SURGERY Bilateral 05/26/2017   Procedure: LEFT BREAST MAMMARY REDUCTION  (BREAST) AND RIGHT ONCOPLASTIC RECONSTRUCTION;  Surgeon: Irene Limbo, MD;  Location: [facility];  Service: Plastics;  Laterality: Bilateral;   CESAREAN SECTION     x 2   DILATION AND CURETTAGE OF UTERUS  05/30/2008   ENDOMETRIAL ABLATION  05/30/2008   RADIOACTIVE SEED GUIDED PARTIAL MASTECTOMY WITH AXILLARY SENTINEL LYMPH NODE BIOPSY Right 05/11/2017   Procedure: RIGHT BRACKETED RADIOACTIVE SEED GUIDED PARTIAL MASTECTOMY WITH  SENTINEL LYMPH NODE BIOPSY WITH ERAS PATHWAY;  Surgeon: [name], MD;  Location: [facility];  Service: General;  Laterality: Right;  PEC BLOCK   REDUCTION MAMMAPLASTY Bilateral    TUBAL LIGATION      I have reviewed the social history and family history with the patient and they are unchanged from previous note.  ALLERGIES:  has No Known Allergies.  MEDICATIONS:  Current Outpatient Medications  Medication Sig Dispense Refill   anastrozole (ARIMIDEX) 1 MG tablet TAKE 1 TABLET BY MOUTH EVERY DAY 90 tablet 3   aspirin 81 MG EC tablet Take by mouth.     Cholecalciferol 50 MCG (2000 UT) CAPS Take by  mouth.     clonazePAM (KLONOPIN) 1 MG tablet Take 0.5 mg by mouth as needed for anxiety.     diazepam (VALIUM) 2 MG tablet TAKE 2MG BY MOUTH DAILY AS NEEDED     diclofenac Sodium (VOLTAREN) 1 % GEL diclofenac 1 % topical gel  APPLY 2 3 TIMES DAILY AS NEEDED     fexofenadine-pseudoephedrine (ALLEGRA-D 24) 180-240 MG per 24 hr tablet Take 1 tablet by mouth daily as needed (for sinus).     gabapentin (NEURONTIN) 600 MG tablet Take 1 tablet (600 mg total) by mouth 3 (three) times daily. 100 tablet 2   HYDROcodone-acetaminophen (NORCO) 5-325 MG tablet Take 1-2 tablets by mouth every 4 (four) hours as needed for moderate pain. 30 tablet 0   lisinopril-hydrochlorothiazide (ZESTORETIC) 20-25 MG tablet lisinopril 20 mg-hydrochlorothiazide 25 mg tablet  TAKE 1 TABLET BY MOUTH EVERY DAY     Meclizine HCl 25 MG CHEW Chew by mouth.     pantoprazole (PROTONIX) 40 MG tablet Take 40 mg daily by mouth.  2   venlafaxine XR (EFFEXOR-XR) 150 MG 24 hr capsule TAKE 1 CAPSULE BY MOUTH DAILY WITH BREAKFAST. 30 capsule 8   vitamin E 180 MG (400 UNITS) capsule Take by mouth.     No current facility-administered medications for this visit.    PHYSICAL EXAMINATION: ECOG PERFORMANCE STATUS: 1 - Symptomatic but completely ambulatory  Vitals:   12/10/21  1352  BP: 128/78  Pulse: 69  Resp: 17  Temp: 98.1 F (36.7 C)  SpO2: 100%   Wt Readings from Last 3 Encounters:  12/10/21 191 lb 11.2 oz (87 kg)  05/14/21 190 lb 12.8 oz (86.5 kg)  11/05/20 195 lb 8 oz (88.7 kg)     GENERAL:alert, no distress and comfortable SKIN: skin color, texture, turgor are normal, no rashes or significant lesions EYES: normal, Conjunctiva are pink and non-injected, sclera clear  NECK: supple, thyroid normal size, non-tender, without nodularity LYMPH:  no palpable lymphadenopathy in the cervical, axillary LUNGS: clear to auscultation and percussion with normal breathing effort HEART: regular rate & rhythm and no murmurs and no lower  extremity edema ABDOMEN:abdomen soft, non-tender and normal bowel sounds Musculoskeletal:no cyanosis of digits and no clubbing  NEURO: alert & oriented x 3 with fluent speech, no focal motor/sensory deficits BREAST: mild lymphadenopathy. No palpable mass, nodules or adenopathy bilaterally. Breast exam benign.   LABORATORY DATA:  I have reviewed the data as listed    Latest Ref Rng & Units 12/10/2021    1:39 PM 05/14/2021   12:52 PM 11/05/2020   12:52 PM  CBC  WBC 4.0 - 10.5 K/uL 6.6  6.6  7.5   Hemoglobin 12.0 - 15.0 g/dL 12.0  11.6  12.3   Hematocrit 36.0 - 46.0 % 37.1  37.3  38.9   Platelets 150 - 400 K/uL 218  228  223         Latest Ref Rng & Units 12/10/2021    1:39 PM 05/14/2021   12:52 PM 11/05/2020   12:52 PM  CMP  Glucose 70 - 99 mg/dL 86  78  94   BUN 6 - 20 mg/dL _0 Creatinine 0.44 - 1.00 mg/dL 0.64  0.74  0.68   Sodium 135 - 145 mmol/L 138  142  140   Potassium 3.5 - 5.1 mmol/L 3.9  3.4  3.9   Chloride 98 - 111 mmol/L 104  105  101   CO2 22 - 32 mmol/L _1 Calcium 8.9 - 10.3 mg/dL 9.7  9.3  9.6   Total Protein 6.5 - 8.1 g/dL 6.9  7.1  7.2   Total Bilirubin 0.3 - 1.2 mg/dL 0.3  0.3  0.3   Alkaline Phos 38 - 126 U/L 58  69  77   AST 15 - 41 U/L _2 ALT 0 - 44 U/L _3 RADIOGRAPHIC STUDIES: I have personally reviewed the radiological images as listed and agreed with the findings in the report. No results found.    Orders Placed This Encounter  Procedures   MM Digital Screening    Standing Status:   Future    Standing Expiration Date:   12/10/2022    Order Specific Question:   Reason for Exam (SYMPTOM  OR DIAGNOSIS REQUIRED)    Answer:   screening    Order Specific Question:   Is the patient pregnant?    Answer:   No    Order Specific Question:   Preferred imaging location?    Answer:   [facility]   Ambulatory referral to Physical Therapy    Referral Priority:   Routine    Referral Type:   Physical Medicine     Referral Reason:   Specialty Services Required    Requested Specialty:  Physical Therapy    Number of Visits Requested:   1   All questions were answered. The patient knows to call the clinic with any problems, questions or concerns. No barriers to learning was detected. The total time spent in the appointment was 30 minutes.     Truitt Merle, MD 12/10/2021   I, Wilburn Mylar, am acting as scribe for Truitt Merle, MD.   I have reviewed the above documentation for accuracy and completeness, and I agree with the above.

## 2021-12-28 ENCOUNTER — Other Ambulatory Visit: Payer: Self-pay | Admitting: Hematology

## 2022-05-16 ENCOUNTER — Other Ambulatory Visit: Payer: Self-pay

## 2022-05-16 NOTE — Progress Notes (Signed)
Epic faxed Mammogram bilateral breast order to [facility] per pt's request.  Pt stated she called to schedule mammogram and was told by [facility] that they did not receive order.  Refaxed order to [place].  Pt will [facility] to schedule mammogram.  Epic fax confirmation received.

## 2022-05-18 ENCOUNTER — Other Ambulatory Visit: Payer: Self-pay

## 2022-05-18 DIAGNOSIS — C50411 Malignant neoplasm of upper-outer quadrant of right female breast: Secondary | ICD-10-CM

## 2022-05-18 NOTE — Progress Notes (Signed)
New order for diagnostic mammogram entered and Epic faxed to [facility].  Epic fax confirmation received.

## 2022-05-20 ENCOUNTER — Other Ambulatory Visit: Payer: Self-pay

## 2022-05-20 DIAGNOSIS — Z170 Estrogen receptor positive status [ER+]: Secondary | ICD-10-CM

## 2022-06-01 ENCOUNTER — Telehealth: Payer: Self-pay | Admitting: Hematology

## 2022-06-01 NOTE — Telephone Encounter (Signed)
Patient aware of appointment change  

## 2022-06-10 ENCOUNTER — Other Ambulatory Visit: Payer: Self-pay

## 2022-06-10 ENCOUNTER — Inpatient Hospital Stay: Payer: 59 | Attending: Hematology

## 2022-06-10 ENCOUNTER — Ambulatory Visit: Payer: 59 | Admitting: Hematology

## 2022-06-10 ENCOUNTER — Other Ambulatory Visit: Payer: 59

## 2022-06-10 ENCOUNTER — Inpatient Hospital Stay (HOSPITAL_BASED_OUTPATIENT_CLINIC_OR_DEPARTMENT_OTHER): Payer: 59 | Admitting: Oncology

## 2022-06-10 VITALS — BP 149/83 | HR 72 | Temp 99.1°F | Resp 18 | Ht 61.0 in | Wt 194.6 lb

## 2022-06-10 DIAGNOSIS — R232 Flushing: Secondary | ICD-10-CM | POA: Insufficient documentation

## 2022-06-10 DIAGNOSIS — E669 Obesity, unspecified: Secondary | ICD-10-CM | POA: Diagnosis not present

## 2022-06-10 DIAGNOSIS — M7989 Other specified soft tissue disorders: Secondary | ICD-10-CM | POA: Insufficient documentation

## 2022-06-10 DIAGNOSIS — K219 Gastro-esophageal reflux disease without esophagitis: Secondary | ICD-10-CM | POA: Insufficient documentation

## 2022-06-10 DIAGNOSIS — Z923 Personal history of irradiation: Secondary | ICD-10-CM | POA: Insufficient documentation

## 2022-06-10 DIAGNOSIS — M5412 Radiculopathy, cervical region: Secondary | ICD-10-CM | POA: Insufficient documentation

## 2022-06-10 DIAGNOSIS — C50411 Malignant neoplasm of upper-outer quadrant of right female breast: Secondary | ICD-10-CM | POA: Insufficient documentation

## 2022-06-10 DIAGNOSIS — Z79899 Other long term (current) drug therapy: Secondary | ICD-10-CM | POA: Diagnosis not present

## 2022-06-10 DIAGNOSIS — Z79811 Long term (current) use of aromatase inhibitors: Secondary | ICD-10-CM | POA: Insufficient documentation

## 2022-06-10 DIAGNOSIS — I890 Lymphedema, not elsewhere classified: Secondary | ICD-10-CM

## 2022-06-10 DIAGNOSIS — I10 Essential (primary) hypertension: Secondary | ICD-10-CM | POA: Diagnosis not present

## 2022-06-10 DIAGNOSIS — N6022 Fibroadenosis of left breast: Secondary | ICD-10-CM | POA: Diagnosis not present

## 2022-06-10 DIAGNOSIS — N6021 Fibroadenosis of right breast: Secondary | ICD-10-CM | POA: Insufficient documentation

## 2022-06-10 DIAGNOSIS — Z170 Estrogen receptor positive status [ER+]: Secondary | ICD-10-CM

## 2022-06-10 DIAGNOSIS — F419 Anxiety disorder, unspecified: Secondary | ICD-10-CM | POA: Insufficient documentation

## 2022-06-10 LAB — CMP (CANCER CENTER ONLY)
ALT: 13 U/L (ref 0–44)
AST: 16 U/L (ref 15–41)
Albumin: 4.6 g/dL (ref 3.5–5.0)
Alkaline Phosphatase: 67 U/L (ref 38–126)
Anion gap: 8 (ref 5–15)
BUN: 13 mg/dL (ref 6–20)
CO2: 30 mmol/L (ref 22–32)
Calcium: 10.1 mg/dL (ref 8.9–10.3)
Chloride: 103 mmol/L (ref 98–111)
Creatinine: 0.61 mg/dL (ref 0.44–1.00)
GFR, Estimated: >60 mL/min (ref >60)
Glucose, Bld: 86 mg/dL (ref 70–99)
Potassium: 3.6 mmol/L (ref 3.5–5.1)
Sodium: 141 mmol/L (ref 135–145)
Total Bilirubin: 0.3 mg/dL (ref 0.3–1.2)
Total Protein: 7.5 g/dL (ref 6.5–8.1)

## 2022-06-10 LAB — CBC WITH DIFFERENTIAL (CANCER CENTER ONLY)
Abs Immature Granulocytes: 0.01 10*3/uL (ref 0.00–0.07)
Basophils Absolute: 0 10*3/uL (ref 0.0–0.1)
Basophils Relative: 0 %
Eosinophils Absolute: 0.2 10*3/uL (ref 0.0–0.5)
Eosinophils Relative: 2 %
HCT: 40 % (ref 36.0–46.0)
Hemoglobin: 12.8 g/dL (ref 12.0–15.0)
Immature Granulocytes: 0 %
Lymphocytes Relative: 36 %
Lymphs Abs: 3 10*3/uL (ref 0.7–4.0)
MCH: 27.5 pg (ref 26.0–34.0)
MCHC: 32 g/dL (ref 30.0–36.0)
MCV: 85.8 fL (ref 80.0–100.0)
Monocytes Absolute: 0.8 10*3/uL (ref 0.1–1.0)
Monocytes Relative: 9 %
Neutro Abs: 4.4 10*3/uL (ref 1.7–7.7)
Neutrophils Relative %: 53 %
Platelet Count: 242 10*3/uL (ref 150–400)
RBC: 4.66 MIL/uL (ref 3.87–5.11)
RDW: 13.3 % (ref 11.5–15.5)
WBC Count: 8.3 10*3/uL (ref 4.0–10.5)
nRBC: 0 % (ref 0.0–0.2)

## 2022-06-10 MED ORDER — ANASTROZOLE 1 MG PO TABS: 1.0000 mg | ORAL_TABLET | Freq: Every day | ORAL | 3 refills | Status: DC

## 2022-06-10 NOTE — Progress Notes (Signed)
[place]   [phone] [phone]   Clinic Follow up Note   Patient Care Team: Orpah Melter, MD as PCP - General (Family Medicine) Truitt Merle, MD as Consulting Physician (Hematology) [name], MD as Consulting Physician (General Surgery) Kyung Rudd, MD as Consulting Physician (Radiation Oncology) Irene Limbo, MD as Consulting Physician (Plastic Surgery) Delice Bison, Charlestine Massed, NP as Nurse Practitioner (Hematology and Oncology) Aurea Graff, PA-C (Inactive) as Physician Assistant (Physician Assistant) Imaging, [facility] as Consulting Physician (Diagnostic Radiology)  Date of Service:  06/10/2022  CHIEF COMPLAINT: f/u of right breast cancer  CURRENT THERAPY:  Anastrozole, starting 08/2017, held 12/26/18 - 04/2019 for joint pain  ASSESSMENT & PLAN:  Felicia Bauer is a 61 y.o. post-menopausal female with   1. Malignant neoplasm of upper-outer quadrant of right breast, invasive ductal carcinoma, stage IA, pT2N0M0, ER+/PR +, HER2 -, Grade I, and DCIS ER+/PR+, RS 12 -diagnosed in 03/2017. S/p right breast lumpectomy with SLNB, breast reconstruction and adjuvant radiation. RS 12, low risk. -She started anti-estrogen therapy with anastrozole in 08/2017. Held from 12/26/18-04/2019 due to significant cervical radiculopathy and joint pain in hands, restarted with no improvement in pain. Managing hot flashes with Effexor and Gabapentin. Plan for 5 years. -most recent mammogram 04/07/21 was benign. -she is clinically stable. Labs reviewed, all WNL. Physical exam was unremarkable except for some lymphedema. There is no clinical concern for recurrence. -Continue surveillance and anastrozole.  Next mammogram scheduled for January 2024. -F/u in 6 months    2. Lymphedema -she has some mild right arm swelling. -She has been referred to the lymphedema clinic.  3. Hot flashes -on gabapentin and Effexor 150 mg daily  due to persistent severe hot flash.   -On Tempo for her hot flash.   4. Bone health -DEXA on 11/10/20 was normal, T-score -0.4. -we previously discussed the potential adverse impact of anastrozole for her bone density.  -continue calcium and vitamin D -plan for repeat DEXA 11/2022.  5. Comorbiditied: HTN, GERD, anxiety, obesity, s/p Cervical laminectomy -Follow-up with primary care physician     PLAN:  -Continue anastrozole  -referral to physical therapy for lymphedema clinic -Diagnostic mammogram in 06/2022 -Lab and f/u in 6 months    No problem-specific Assessment & Plan notes found for this encounter.   SUMMARY OF ONCOLOGIC HISTORY: Oncology History Overview Note  Cancer Staging Malignant neoplasm of upper-outer quadrant of right breast, estrogen receptor positive ([place]) Staging form: Breast, AJCC 8th Edition - Clinical stage from 04/12/2017: Stage IIA (cT3, cN0, cM0, G1, ER: Positive, PR: Positive, HER2: Negative) - Signed by Truitt Merle, MD on 04/16/2017 - Pathologic: Stage IA (pT2, pN0(sn), cM0, G1, ER: Positive, PR: Positive, HER2: Negative, Oncotype DX score: 12) - Unsigned     Malignant neoplasm of upper-outer quadrant of right breast, estrogen receptor positive ([place])  04/07/2017 Mammogram   IMPRESSION: 1. Highly suspicious mass within the upper right breast, 12 to 12:30 o'clock axis, at posterior depth, with associated architectural distortion and pleomorphic calcifications. The mass measures 1 cm greatest dimension. The mass and associated microcalcifications measure 1.4 cm. No sonographic correlate identified. Recommend stereotactic biopsy using 3D tomosynthesis guidance. 2. Additional punctate and coarse heterogeneous calcifications within the upper outer quadrant of the right breast, 11-12 o'clock axis region, at middle depth, with a suspicious segmental distribution, spanning approximately 5 cm, the most suspicious calcifications best seen on the CC magnification  views. Recommend additional stereotactic biopsy for these right breast calcifications  to exclude multifocal/multicentric disease and/or define extent of disease.   04/12/2017 Initial Biopsy   Diagnosis 1. 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 - SEE COMMENT. 2. Breast, right, needle core biopsy, loosely grouped calcs in more central, upper outer quadrant - DUCTAL CARCINOMA IN SITU WITH CALCIFICATIONS. - LOBULAR NEOPLASIA (ATYPICAL LOBULAR HYPERPLASIA). - FIBROCYSTIC CHANGES WITH ADENOSIS AND CALCIFICATIONS.   04/12/2017 Receptors her2   Estrogen Receptor: 95%, POSITIVE, STRONG STAINING INTENSITY Progesterone Receptor: 95%, POSITIVE, STRONG STAINING INTENSITY Proliferation Marker Ki67: 3% HER2: NEGATIVE   04/16/2017 Initial Diagnosis   Malignant neoplasm of upper-outer quadrant of right breast, estrogen receptor positive ([place])   04/21/2017 Mammogram   04/21/2017 Mammogram IMPRESSION: 1. 2.8 x 2.0 x 0.5 cm biopsy-proven invasive ductal carcinoma and ductal carcinoma in situ in the posterior aspect of the upper-outer quadrant of the right breast. 2. No mass or abnormal enhancement at the location of the recently biopsied low-grade ductal carcinoma in situ with calcifications and atypical lobular hyperplasia in the central aspect of the upper-outer quadrant of the right breast. 3. No evidence of malignancy on the left.   05/11/2017 Surgery   Right breast lumpectomy and SLN biopsy    05/11/2017 Pathology Results   Right lumpectomy showed a 2.1cm invasive ductal carcinoma, margins are negative, (+)ADH, 6 nodes are all negative    05/11/2017 Oncotype testing   RS 12, predicts 9-year distant recurrence 3% with tamoxifen or AI    05/26/2017 Surgery   Breast reduction by Dr. Iran Planas    05/26/2017 Pathology Results   Diagnosis 1. Breast,  Mammoplasty, left - FIBROCYSTIC CHANGES WITH ADENOSIS AND CALCIFICATIONS. - THERE IS NO EVIDENCE OF MALIGNANCY. 2. Breast, Mammoplasty, right - FIBROCYSTIC CHANGES WITH ADENOSIS AND CALCIFICATIONS. - FAT NECROSIS. - THERE IS NO EVIDENCE OF MALIGNANCY.   07/06/2017 - 08/18/2017 Radiation Therapy   Adjuvant breast radiation with Dr. Lisbeth Renshaw   08/2017 -  Anti-estrogen oral therapy   Anastrozole 48m daily   04/02/2018 Mammogram   Mammogram 04/02/18 There are calcifications near the lateral right lumpectomy site best seen on the CC view. There are also calcifications located behind the nipple on the CC view, likely superiorly located on the 90 degree view near the site of the second lumpectomy. There are also new calcifications located in the posterior slightly medial left breast, best seen on the CC view.   Mammographic images were processed with CAD.   04/05/2018 Pathology Results   Diagnosis 04/05/18 1. Breast, left, needle core biopsy, upper inner - FIBROCYSTIC CHANGES AND ADENOSIS WITH CALCIFICATIONS - NO MALIGNANCY IDENTIFIED 2. Breast, right, needle core biopsy, upper central - FIBROCYSTIC CHANGES, ADENOSIS AND FAT NECROSIS WITH CALCIFICATIONS - NO MALIGNANCY IDENTIFIED 3. Breast, right, needle core biopsy, upper outer - FIBROCYSTIC CHANGES, ADENOSIS AND FAT NECROSIS WITH CALCIFICATIONS - NO MALIGNANCY IDENTIFIED      INTERVAL HISTORY:  AMillee Deniseis here for a follow up of breast cancer. She presents to the clinic alone. She reports she is having some right arm swelling (same side as breast surgery).  She also reports some pain to her right lateral breast.  Next mammogram scheduled at the end of January 2024.  Does not notice any lumps in this area.  Hot flashes from anastrozole are improved after taking tempo.   All other systems were reviewed with the patient and are negative.  MEDICAL HISTORY:  Past Medical  History:  Diagnosis Date   Arthritis    knee     Breast cancer, right ([place]) [id number]/[id number]   Complication of anesthesia    hard to wake up post-op; itching after anesthesia   GERD (gastroesophageal reflux disease)    History of vertigo    Hypertension    states under control with med., has been on med. > 10 yr.   Mitral valve prolapse    Panic attacks    Personal history of radiation therapy    Wears partial dentures    upper    SURGICAL HISTORY: Past Surgical History:  Procedure Laterality Date   ANTERIOR CERVICAL DECOMP/DISCECTOMY FUSION N/A 05/24/2013   Procedure: CERVICAL FOUR-FIVE, CERVICAL FIVE-SIX, CERVICAL SIX-SEVEN ANTERIOR CERVICAL DECOMPRESSION/DISCECTOMY FUSION 3 LEVELS;  Surgeon: Faythe Ghee, MD;  Location: [facility];  Service: Neurosurgery;  Laterality: N/A;   ANTERIOR CERVICAL DECOMP/DISCECTOMY FUSION  10/27/2016   C3-4   BREAST BIOPSY Bilateral 03/2018   benign   BREAST LUMPECTOMY Right 2018   x 2   BREAST REDUCTION SURGERY Bilateral 05/26/2017   Procedure: LEFT BREAST MAMMARY REDUCTION  (BREAST) AND RIGHT ONCOPLASTIC RECONSTRUCTION;  Surgeon: Irene Limbo, MD;  Location: [facility];  Service: Plastics;  Laterality: Bilateral;   CESAREAN SECTION     x 2   DILATION AND CURETTAGE OF UTERUS  05/30/2008   ENDOMETRIAL ABLATION  05/30/2008   RADIOACTIVE SEED GUIDED PARTIAL MASTECTOMY WITH AXILLARY SENTINEL LYMPH NODE BIOPSY Right 05/11/2017   Procedure: RIGHT BRACKETED RADIOACTIVE SEED GUIDED PARTIAL MASTECTOMY WITH  SENTINEL LYMPH NODE BIOPSY WITH ERAS PATHWAY;  Surgeon: [name], MD;  Location: [facility];  Service: General;  Laterality: Right;  PEC BLOCK   REDUCTION MAMMAPLASTY Bilateral    TUBAL LIGATION      I have reviewed the social history and family history with the patient and they are unchanged from previous note.  ALLERGIES:  has No Known Allergies.  MEDICATIONS:  Current Outpatient Medications  Medication Sig Dispense Refill   anastrozole (ARIMIDEX) 1 MG  tablet TAKE 1 TABLET BY MOUTH EVERY DAY 90 tablet 3   aspirin 81 MG EC tablet Take by mouth.     Cholecalciferol 50 MCG (2000 UT) CAPS Take by mouth.     clonazePAM (KLONOPIN) 1 MG tablet Take 0.5 mg by mouth as needed for anxiety.     diazepam (VALIUM) 2 MG tablet TAKE 2MG BY MOUTH DAILY AS NEEDED     diclofenac Sodium (VOLTAREN) 1 % GEL diclofenac 1 % topical gel  APPLY 2 3 TIMES DAILY AS NEEDED     fexofenadine-pseudoephedrine (ALLEGRA-D 24) 180-240 MG per 24 hr tablet Take 1 tablet by mouth daily as needed (for sinus).     gabapentin (NEURONTIN) 600 MG tablet Take 1 tablet (600 mg total) by mouth 3 (three) times daily. 100 tablet 2   HYDROcodone-acetaminophen (NORCO) 5-325 MG tablet Take 1-2 tablets by mouth every 4 (four) hours as needed for moderate pain. 30 tablet 0   lisinopril-hydrochlorothiazide (ZESTORETIC) 20-25 MG tablet lisinopril 20 mg-hydrochlorothiazide 25 mg tablet  TAKE 1 TABLET BY MOUTH EVERY DAY     Meclizine HCl 25 MG CHEW Chew by mouth.     pantoprazole (PROTONIX) 40 MG tablet Take 40 mg daily by mouth.  2   venlafaxine XR (EFFEXOR-XR) 150 MG 24 hr capsule TAKE 1 CAPSULE BY MOUTH DAILY WITH BREAKFAST. 90 capsule 3   vitamin E 180 MG (400 UNITS) capsule Take by mouth.  No current facility-administered medications for this visit.    PHYSICAL EXAMINATION: ECOG PERFORMANCE STATUS: 1 - Symptomatic but completely ambulatory  Vitals:   06/10/22 1046  BP: (!) 149/83  Pulse: 72  Resp: 18  Temp: 99.1 F (37.3 C)  SpO2: 100%   Wt Readings from Last 3 Encounters:  06/10/22 88.3 kg  12/10/21 87 kg  05/14/21 86.5 kg     GENERAL:alert, no distress and comfortable SKIN: skin color, texture, turgor are normal, no rashes or significant lesions EYES: normal, Conjunctiva are pink and non-injected, sclera clear  NECK: supple, thyroid normal size, non-tender, without nodularity LYMPH:  no palpable lymphadenopathy in the cervical, axillary LUNGS: clear to auscultation  and percussion with normal breathing effort HEART: regular rate & rhythm and no murmurs and no lower extremity edema ABDOMEN:abdomen soft, non-tender and normal bowel sounds Musculoskeletal:no cyanosis of digits and no clubbing  NEURO: alert & oriented x 3 with fluent speech, no focal motor/sensory deficits BREAST: mild lymphadenopathy. No palpable mass, nodules or adenopathy bilaterally. Breast exam benign.   LABORATORY DATA:  I have reviewed the data as listed    Latest Ref Rng & Units 06/10/2022   10:20 AM 12/10/2021    1:39 PM 05/14/2021   12:52 PM  CBC  WBC 4.0 - 10.5 K/uL 8.3  6.6  6.6   Hemoglobin 12.0 - 15.0 g/dL 12.8  12.0  11.6   Hematocrit 36.0 - 46.0 % 40.0  37.1  37.3   Platelets 150 - 400 K/uL 242  218  228         Latest Ref Rng & Units 06/10/2022   10:20 AM 12/10/2021    1:39 PM 05/14/2021   12:52 PM  CMP  Glucose 70 - 99 mg/dL 86  86  78   BUN 6 - 20 mg/dL _0 Creatinine 0.44 - 1.00 mg/dL 0.61  0.64  0.74   Sodium 135 - 145 mmol/L 141  138  142   Potassium 3.5 - 5.1 mmol/L 3.6  3.9  3.4   Chloride 98 - 111 mmol/L 103  104  105   CO2 22 - 32 mmol/L _1 Calcium 8.9 - 10.3 mg/dL 10.1  9.7  9.3   Total Protein 6.5 - 8.1 g/dL 7.5  6.9  7.1   Total Bilirubin 0.3 - 1.2 mg/dL 0.3  0.3  0.3   Alkaline Phos 38 - 126 U/L 67  58  69   AST 15 - 41 U/L _2 ALT 0 - 44 U/L _3 RADIOGRAPHIC STUDIES: I have personally reviewed the radiological images as listed and agreed with the findings in the report. No results found.    No orders of the defined types were placed in this encounter.     Mikey Bussing, NP 06/10/2022

## 2022-07-13 ENCOUNTER — Ambulatory Visit
Admission: RE | Admit: 2022-07-13 | Discharge: 2022-07-13 | Disposition: A | Discharge status: Home / Self Care | Payer: 59 | Source: Ambulatory Visit | Attending: Hematology | Admitting: Hematology

## 2022-07-13 ENCOUNTER — Ambulatory Visit: Payer: 59

## 2022-07-13 DIAGNOSIS — Z170 Estrogen receptor positive status [ER+]: Secondary | ICD-10-CM

## 2022-07-13 HISTORY — DX: Malignant neoplasm of unspecified site of unspecified female breast: C50.919

## 2022-11-09 ENCOUNTER — Telehealth: Payer: Self-pay | Admitting: Hematology

## 2022-11-09 NOTE — Telephone Encounter (Signed)
Patient

## 2022-12-09 ENCOUNTER — Other Ambulatory Visit: Payer: 59

## 2022-12-09 ENCOUNTER — Ambulatory Visit: Payer: 59 | Admitting: Hematology

## 2023-01-06 ENCOUNTER — Other Ambulatory Visit: Payer: Self-pay

## 2023-01-06 ENCOUNTER — Inpatient Hospital Stay: Payer: 59 | Attending: Hematology

## 2023-01-06 ENCOUNTER — Encounter: Payer: Self-pay | Admitting: Hematology

## 2023-01-06 ENCOUNTER — Inpatient Hospital Stay: Payer: 59 | Admitting: Hematology

## 2023-01-06 VITALS — BP 133/72 | HR 60 | Temp 97.6°F | Resp 18 | Ht 61.0 in | Wt 190.5 lb

## 2023-01-06 DIAGNOSIS — R232 Flushing: Secondary | ICD-10-CM | POA: Diagnosis not present

## 2023-01-06 DIAGNOSIS — I890 Lymphedema, not elsewhere classified: Secondary | ICD-10-CM | POA: Diagnosis not present

## 2023-01-06 DIAGNOSIS — Z170 Estrogen receptor positive status [ER+]: Secondary | ICD-10-CM

## 2023-01-06 DIAGNOSIS — Z79811 Long term (current) use of aromatase inhibitors: Secondary | ICD-10-CM | POA: Insufficient documentation

## 2023-01-06 DIAGNOSIS — M5412 Radiculopathy, cervical region: Secondary | ICD-10-CM | POA: Insufficient documentation

## 2023-01-06 DIAGNOSIS — C50411 Malignant neoplasm of upper-outer quadrant of right female breast: Secondary | ICD-10-CM

## 2023-01-06 DIAGNOSIS — Z923 Personal history of irradiation: Secondary | ICD-10-CM | POA: Diagnosis not present

## 2023-01-06 DIAGNOSIS — E2839 Other primary ovarian failure: Secondary | ICD-10-CM | POA: Diagnosis not present

## 2023-01-06 DIAGNOSIS — N951 Menopausal and female climacteric states: Secondary | ICD-10-CM | POA: Diagnosis not present

## 2023-01-06 DIAGNOSIS — M2550 Pain in unspecified joint: Secondary | ICD-10-CM | POA: Diagnosis not present

## 2023-01-06 DIAGNOSIS — Z79899 Other long term (current) drug therapy: Secondary | ICD-10-CM | POA: Diagnosis not present

## 2023-01-06 LAB — CBC WITH DIFFERENTIAL (CANCER CENTER ONLY)
Abs Immature Granulocytes: 0.01 10*3/uL (ref 0.00–0.07)
Basophils Absolute: 0 10*3/uL (ref 0.0–0.1)
Basophils Relative: 1 %
Eosinophils Absolute: 0.2 10*3/uL (ref 0.0–0.5)
Eosinophils Relative: 3 %
HCT: 35.5 % — ABNORMAL LOW (ref 36.0–46.0)
Hemoglobin: 11.4 g/dL — ABNORMAL LOW (ref 12.0–15.0)
Immature Granulocytes: 0 %
Lymphocytes Relative: 39 %
Lymphs Abs: 2.2 10*3/uL (ref 0.7–4.0)
MCH: 27.6 pg (ref 26.0–34.0)
MCHC: 32.1 g/dL (ref 30.0–36.0)
MCV: 86 fL (ref 80.0–100.0)
Monocytes Absolute: 0.6 10*3/uL (ref 0.1–1.0)
Monocytes Relative: 10 %
Neutro Abs: 2.8 10*3/uL (ref 1.7–7.7)
Neutrophils Relative %: 47 %
Platelet Count: 211 10*3/uL (ref 150–400)
RBC: 4.13 MIL/uL (ref 3.87–5.11)
RDW: 13.2 % (ref 11.5–15.5)
WBC Count: 5.8 10*3/uL (ref 4.0–10.5)
nRBC: 0 % (ref 0.0–0.2)

## 2023-01-06 LAB — CMP (CANCER CENTER ONLY)
ALT: 12 U/L (ref 0–44)
AST: 15 U/L (ref 15–41)
Albumin: 4.2 g/dL (ref 3.5–5.0)
Alkaline Phosphatase: 58 U/L (ref 38–126)
Anion gap: 5 (ref 5–15)
BUN: 15 mg/dL (ref 6–20)
CO2: 30 mmol/L (ref 22–32)
Calcium: 9.5 mg/dL (ref 8.9–10.3)
Chloride: 104 mmol/L (ref 98–111)
Creatinine: 0.63 mg/dL (ref 0.44–1.00)
GFR, Estimated: >60 mL/min (ref >60)
Glucose, Bld: 80 mg/dL (ref 70–99)
Potassium: 3.8 mmol/L (ref 3.5–5.1)
Sodium: 139 mmol/L (ref 135–145)
Total Bilirubin: 0.4 mg/dL (ref 0.3–1.2)
Total Protein: 6.6 g/dL (ref 6.5–8.1)

## 2023-01-06 MED ORDER — ANASTROZOLE 1 MG PO TABS: 1.0000 mg | ORAL_TABLET | Freq: Every day | ORAL | 3 refills | Status: DC

## 2023-01-06 NOTE — Assessment & Plan Note (Signed)
stage IA, pT2N0M0, ER+/PR +, HER2 -, Grade I, and DCIS ER+/PR+, RS 12 -diagnosed in 03/2017. S/p right breast lumpectomy with SLNB, breast reconstruction and adjuvant radiation. RS 12, low risk. -She started anti-estrogen therapy with anastrozole in 08/2017. Held from 12/26/18-04/2019 due to significant cervical radiculopathy and joint pain in hands, restarted with no improvement in pain. Managing hot flashes with Effexor and Gabapentin.  -she has now completed 5 years anastrozole.  I discussed the role of extended aromatase inhibitor and a breast index test to see if she would benefit. -most recent mammogram 06/2022 was benign. -she is clinically stable. Labs reviewed, all WNL. Physical exam was unremarkable except for some lymphedema. There is no clinical concern for recurrence. -Continue surveillance

## 2023-01-06 NOTE — Progress Notes (Signed)
[facility]   [phone]) [phone] Fax:(336) [phone]   Clinic Follow up Note   Patient Care Team: Joycelyn Rua, MD as PCP - General (Family Medicine) Malachy Mood, MD as Consulting Physician (Hematology) Almond Lint, MD as Consulting Physician (General Surgery) Dorothy Puffer, MD as Consulting Physician (Radiation Oncology) Glenna Fellows, MD as Consulting Physician (Plastic Surgery) Axel Filler, Larna Daughters, NP as Nurse Practitioner (Hematology and Oncology) Roseanna Rainbow, PA-C (Inactive) as Physician Assistant (Physician Assistant) Imaging, [facility] as Consulting Physician (Diagnostic Radiology)  Date of Service:  01/06/2023  CHIEF COMPLAINT: f/u of right breast cancer   CURRENT THERAPY:  Anastrozole, starting 08/2017, held 12/26/18 - 04/2019 for joint pain    ASSESSMENT:  Felicia Bauer is a 62 y.o. 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 S/p right breast lumpectomy with SLNB, breast reconstruction and adjuvant radiation. RS 12, low risk. -She started anti-estrogen therapy with anastrozole in 08/2017. Held from 12/26/18-04/2019 due to significant cervical radiculopathy and joint pain in hands, restarted with no improvement in pain. Managing hot flashes with Effexor and Gabapentin.  -she has now completed 5 years anastrozole.  I discussed the role of extended aromatase inhibitor and a breast index test to see if she would benefit. -most recent mammogram 06/2022 was benign. -she is clinically stable. Labs reviewed, all WNL. Physical exam was unremarkable except for some lymphedema. There is no clinical concern for recurrence. -Continue surveillance     PLAN: - f/u in 1 year with Mardella Layman  - Mammogram in Jan 25 - Bone Density Jan 25 - reviewed labs - schedule  Colonoscopy - add multivitamin - refill Anastrozole   SUMMARY OF ONCOLOGIC HISTORY: Oncology History Overview Note  Cancer Staging Malignant neoplasm of upper-outer quadrant of right breast, estrogen receptor positive (HCC) Staging form: Breast, AJCC 8th Edition - Clinical stage from 04/12/2017: Stage IIA (cT3, cN0, cM0, G1, ER: Positive, PR: Positive, HER2: Negative) - Signed by Malachy Mood, MD on 04/16/2017 - Pathologic: Stage IA (pT2, pN0(sn), cM0, G1, ER: Positive, PR: Positive, HER2: Negative, Oncotype DX score: 12) - Unsigned     Malignant neoplasm of upper-outer quadrant of right breast, estrogen receptor positive (HCC)  04/07/2017 Mammogram   IMPRESSION: 1. Highly suspicious mass within the upper right breast, 12 to 12:30 o'clock axis, at posterior depth, with associated architectural distortion and pleomorphic calcifications. The mass measures 1 cm greatest dimension. The mass and associated microcalcifications measure 1.4 cm. No sonographic correlate identified. Recommend stereotactic biopsy using 3D tomosynthesis guidance. 2. Additional punctate and coarse heterogeneous calcifications within the upper outer quadrant of the right breast, 11-12 o'clock axis region, at middle depth, with a suspicious segmental distribution, spanning approximately 5 cm, the most suspicious calcifications best seen on the CC magnification views. Recommend additional stereotactic biopsy for these right breast calcifications to exclude multifocal/multicentric disease and/or define extent of disease.   04/12/2017 Initial Biopsy   Diagnosis 1. 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 - SEE COMMENT. 2. Breast, right, needle core biopsy, loosely grouped calcs in more central, upper outer quadrant - DUCTAL CARCINOMA IN SITU WITH CALCIFICATIONS. - LOBULAR NEOPLASIA  (ATYPICAL LOBULAR HYPERPLASIA). - FIBROCYSTIC CHANGES WITH ADENOSIS AND CALCIFICATIONS.   04/12/2017 Receptors her2  Estrogen Receptor: 95%, POSITIVE, STRONG STAINING INTENSITY Progesterone Receptor: 95%, POSITIVE, STRONG STAINING INTENSITY Proliferation Marker Ki67: 3% HER2: NEGATIVE   04/16/2017 Initial Diagnosis   Malignant neoplasm of upper-outer quadrant of right breast, estrogen receptor positive (HCC)   04/21/2017 Mammogram   04/21/2017 Mammogram IMPRESSION: 1. 2.8 x 2.0 x 0.5 cm biopsy-proven invasive ductal carcinoma and ductal carcinoma in situ in the posterior aspect of the upper-outer quadrant of the right breast. 2. No mass or abnormal enhancement at the location of the recently biopsied low-grade ductal carcinoma in situ with calcifications and atypical lobular hyperplasia in the central aspect of the upper-outer quadrant of the right breast. 3. No evidence of malignancy on the left.   05/11/2017 Surgery   Right breast lumpectomy and SLN biopsy    05/11/2017 Pathology Results   Right lumpectomy showed a 2.1cm invasive ductal carcinoma, margins are negative, (+)ADH, 6 nodes are all negative    05/11/2017 Oncotype testing   RS 12, predicts 9-year distant recurrence 3% with tamoxifen or AI    05/26/2017 Surgery   Breast reduction by Dr. Leta Baptist    05/26/2017 Pathology Results   Diagnosis 1. Breast, Mammoplasty, left - FIBROCYSTIC CHANGES WITH ADENOSIS AND CALCIFICATIONS. - THERE IS NO EVIDENCE OF MALIGNANCY. 2. Breast, Mammoplasty, right - FIBROCYSTIC CHANGES WITH ADENOSIS AND CALCIFICATIONS. - FAT NECROSIS. - THERE IS NO EVIDENCE OF MALIGNANCY.   07/06/2017 - 08/18/2017 Radiation Therapy   Adjuvant breast radiation with Dr. Mitzi Hansen   08/2017 -  Anti-estrogen oral therapy   Anastrozole 1mg  daily   04/02/2018 Mammogram   Mammogram 04/02/18 There are calcifications near the lateral right lumpectomy site best seen on the CC view. There are also calcifications  located behind the nipple on the CC view, likely superiorly located on the 90 degree view near the site of the second lumpectomy. There are also new calcifications located in the posterior slightly medial left breast, best seen on the CC view.   Mammographic images were processed with CAD.   04/05/2018 Pathology Results   Diagnosis 04/05/18 1. Breast, left, needle core biopsy, upper inner - FIBROCYSTIC CHANGES AND ADENOSIS WITH CALCIFICATIONS - NO MALIGNANCY IDENTIFIED 2. Breast, right, needle core biopsy, upper central - FIBROCYSTIC CHANGES, ADENOSIS AND FAT NECROSIS WITH CALCIFICATIONS - NO MALIGNANCY IDENTIFIED 3. Breast, right, needle core biopsy, upper outer - FIBROCYSTIC CHANGES, ADENOSIS AND FAT NECROSIS WITH CALCIFICATIONS - NO MALIGNANCY IDENTIFIED      INTERVAL HISTORY:  Melani Birkner is here for a follow up of  breast cancer. She was last seen by me on 12/10/2021. She presents to the clinic today alone today. No issues with meds. She is having severe hot flashes but she can tolerate. She is having joint pain.    All other systems were reviewed with the patient and are negative.  MEDICAL HISTORY:  Past Medical History:  Diagnosis Date   Arthritis    knee    Breast cancer (HCC)    Breast cancer, right (HCC) 04/2017   Complication of anesthesia    hard to wake up post-op; itching after anesthesia   GERD (gastroesophageal reflux disease)    History of vertigo    Hypertension    states under control with med., has been on med. > 10 yr.   Mitral valve prolapse    Panic attacks    Personal history of radiation therapy    Wears partial dentures    upper    SURGICAL HISTORY: Past Surgical History:  Procedure Laterality Date  ANTERIOR CERVICAL DECOMP/DISCECTOMY FUSION N/A 05/24/2013   Procedure: CERVICAL FOUR-FIVE, CERVICAL FIVE-SIX, CERVICAL SIX-SEVEN ANTERIOR CERVICAL DECOMPRESSION/DISCECTOMY FUSION 3 LEVELS;  Surgeon: Reinaldo Meeker, MD;  Location:  [facility];  Service: Neurosurgery;  Laterality: N/A;   ANTERIOR CERVICAL DECOMP/DISCECTOMY FUSION  10/27/2016   C3-4   BREAST BIOPSY Bilateral 03/2018   benign   BREAST LUMPECTOMY Right 2018   x 2   BREAST REDUCTION SURGERY Bilateral 05/26/2017   Procedure: LEFT BREAST MAMMARY REDUCTION  (BREAST) AND RIGHT ONCOPLASTIC RECONSTRUCTION;  Surgeon: Glenna Fellows, MD;  Location: [facility];  Service: Plastics;  Laterality: Bilateral;   CESAREAN SECTION     x 2   DILATION AND CURETTAGE OF UTERUS  05/30/2008   ENDOMETRIAL ABLATION  05/30/2008   RADIOACTIVE SEED GUIDED PARTIAL MASTECTOMY WITH AXILLARY SENTINEL LYMPH NODE BIOPSY Right 05/11/2017   Procedure: RIGHT BRACKETED RADIOACTIVE SEED GUIDED PARTIAL MASTECTOMY WITH  SENTINEL LYMPH NODE BIOPSY WITH ERAS PATHWAY;  Surgeon: Almond Lint, MD;  Location: [facility];  Service: General;  Laterality: Right;  PEC BLOCK   REDUCTION MAMMAPLASTY Bilateral    TUBAL LIGATION      I have reviewed the social history and family history with the patient and they are unchanged from previous note.  ALLERGIES:  has No Known Allergies.  MEDICATIONS:  Current Outpatient Medications  Medication Sig Dispense Refill   anastrozole (ARIMIDEX) 1 MG tablet Take 1 tablet (1 mg total) by mouth daily. 90 tablet 3   aspirin 81 MG EC tablet Take by mouth.     Cholecalciferol 50 MCG (2000 UT) CAPS Take by mouth.     clonazePAM (KLONOPIN) 1 MG tablet Take 0.5 mg by mouth as needed for anxiety.     diazepam (VALIUM) 2 MG tablet TAKE 2MG  BY MOUTH DAILY AS NEEDED     diclofenac Sodium (VOLTAREN) 1 % GEL diclofenac 1 % topical gel  APPLY 2 3 TIMES DAILY AS NEEDED     fexofenadine-pseudoephedrine (ALLEGRA-D 24) 180-240 MG per 24 hr tablet Take 1 tablet by mouth daily as needed (for sinus).     gabapentin (NEURONTIN) 600 MG tablet Take 1 tablet (600 mg total) by mouth 3 (three) times daily. 100 tablet 2   HYDROcodone-acetaminophen (NORCO)  5-325 MG tablet Take 1-2 tablets by mouth every 4 (four) hours as needed for moderate pain. 30 tablet 0   lisinopril-hydrochlorothiazide (ZESTORETIC) 20-25 MG tablet lisinopril 20 mg-hydrochlorothiazide 25 mg tablet  TAKE 1 TABLET BY MOUTH EVERY DAY     Meclizine HCl 25 MG CHEW Chew by mouth.     pantoprazole (PROTONIX) 40 MG tablet Take 40 mg daily by mouth.  2   venlafaxine XR (EFFEXOR-XR) 150 MG 24 hr capsule TAKE 1 CAPSULE BY MOUTH DAILY WITH BREAKFAST. 90 capsule 3   vitamin E 180 MG (400 UNITS) capsule Take by mouth.     No current facility-administered medications for this visit.    PHYSICAL EXAMINATION: ECOG PERFORMANCE STATUS: 0 - Asymptomatic  Vitals:   01/06/23 1333  BP: 133/72  Pulse: 60  Resp: 18  Temp: 97.6 F (36.4 C)  SpO2: 100%   Wt Readings from Last 3 Encounters:  01/06/23 190 lb 8 oz (86.4 kg)  06/10/22 194 lb 9.6 oz (88.3 kg)  12/10/21 191 lb 11.2 oz (87 kg)    GENERAL:alert, no distress and comfortable SKIN: skin color, texture, turgor are normal, no rashes or significant lesions EYES: normal, Conjunctiva are pink and non-injected, sclera clear NECK: supple,  thyroid normal size, non-tender, without nodularity LYMPH:  no palpable lymphadenopathy in the cervical, axillary  LUNGS: clear to auscultation and percussion with normal breathing effort HEART: regular rate & rhythm and no murmurs and no lower extremity edema ABDOMEN:abdomen soft, non-tender and normal bowel sounds Musculoskeletal:no cyanosis of digits and no clubbing  NEURO: alert & oriented x 3 with fluent speech, no focal motor/sensory deficits BREAST: Rt breast no palpable mass, Lt breast no palpable mass. Breast exam benign  LABORATORY DATA:  I have reviewed the data as listed    Latest Ref Rng & Units 01/06/2023    1:03 PM 06/10/2022   10:20 AM 12/10/2021    1:39 PM  CBC  WBC 4.0 - 10.5 K/uL 5.8  8.3  6.6   Hemoglobin 12.0 - 15.0 g/dL 88.4  16.6  06.3   Hematocrit 36.0 - 46.0 % 35.5   40.0  37.1   Platelets 150 - 400 K/uL 211  242  218         Latest Ref Rng & Units 01/06/2023    1:03 PM 06/10/2022   10:20 AM 12/10/2021    1:39 PM  CMP  Glucose 70 - 99 mg/dL 80  86  86   BUN 6 - 20 mg/dL 15  13  11    Creatinine 0.44 - 1.00 mg/dL 0.16  0.10  9.32   Sodium 135 - 145 mmol/L 139  141  138   Potassium 3.5 - 5.1 mmol/L 3.8  3.6  3.9   Chloride 98 - 111 mmol/L 104  103  104   CO2 22 - 32 mmol/L 30  30  29    Calcium 8.9 - 10.3 mg/dL 9.5  35.5  9.7   Total Protein 6.5 - 8.1 g/dL 6.6  7.5  6.9   Total Bilirubin 0.3 - 1.2 mg/dL 0.4  0.3  0.3   Alkaline Phos 38 - 126 U/L 58  67  58   AST 15 - 41 U/L 15  16  15    ALT 0 - 44 U/L 12  13  11        RADIOGRAPHIC STUDIES: I have personally reviewed the radiological images as listed and agreed with the findings in the report. No results found.    Orders Placed This Encounter  Procedures   DG Bone Density    Standing Status:   Future    Standing Expiration Date:   01/06/2024    Order Specific Question:   Reason for Exam (SYMPTOM  OR DIAGNOSIS REQUIRED)    Answer:   screning    Order Specific Question:   Is patient pregnant?    Answer:   No    Order Specific Question:   Preferred imaging location?    [facility]   Amb Referral to Survivorship Long term    Referral Priority:   Routine    Referral Type:   Consultation    Number of Visits Requested:   1   All questions were answered. The patient knows to call the clinic with any problems, questions or concerns. No barriers to learning was detected. The total time spent in the appointment was 25 minutes.     Malachy Mood, MD 01/06/2023   I, Sharlette Dense, CMA, am acting as scribe for Malachy Mood, MD.   I have reviewed the above documentation for accuracy and completeness, and I agree with the above.

## 2023-01-14 ENCOUNTER — Other Ambulatory Visit: Payer: Self-pay | Admitting: Hematology

## 2023-01-31 ENCOUNTER — Other Ambulatory Visit: Payer: Self-pay

## 2023-01-31 DIAGNOSIS — C50411 Malignant neoplasm of upper-outer quadrant of right female breast: Secondary | ICD-10-CM

## 2023-02-01 ENCOUNTER — Inpatient Hospital Stay: Payer: 59

## 2023-02-01 ENCOUNTER — Inpatient Hospital Stay: Payer: 59 | Admitting: Hematology

## 2023-02-01 NOTE — Assessment & Plan Note (Deleted)
 stage IA, pT2N0M0, ER+/PR +, HER2 -, Grade I, and DCIS ER+/PR+, RS 12 -diagnosed in 03/2017. S/p right breast lumpectomy with SLNB, breast reconstruction and adjuvant radiation. RS 12, low risk. -She started anti-estrogen therapy with anastrozole  in 08/2017. Held from 12/26/18-04/2019 due to significant cervical radiculopathy and joint pain in hands, restarted with no improvement in pain. Managing hot flashes with Effexor  and Gabapentin .  -she has now completed 5 years anastrozole .  I discussed the role of extended aromatase inhibitor and a breast index test to see if she would benefit. -most recent mammogram 06/2022 was benign. -she is clinically stable. Labs reviewed, all WNL. Physical exam was unremarkable except for some lymphedema. There is no clinical concern for recurrence. -Continue surveillance

## 2023-07-21 ENCOUNTER — Other Ambulatory Visit: Payer: Self-pay

## 2023-12-01 ENCOUNTER — Ambulatory Visit
Admission: RE | Admit: 2023-12-01 | Discharge: 2023-12-01 | Disposition: A | Discharge status: Home / Self Care | Source: Ambulatory Visit | Attending: Hematology | Admitting: Hematology

## 2023-12-01 DIAGNOSIS — Z170 Estrogen receptor positive status [ER+]: Secondary | ICD-10-CM

## 2024-01-04 ENCOUNTER — Inpatient Hospital Stay: Payer: 59 | Attending: Adult Health | Admitting: Adult Health

## 2024-01-04 ENCOUNTER — Encounter: Payer: Self-pay | Admitting: Adult Health

## 2024-01-04 VITALS — BP 132/68 | HR 71 | Temp 97.9°F | Resp 16 | Ht 61.0 in | Wt 192.1 lb

## 2024-01-04 DIAGNOSIS — N6312 Unspecified lump in the right breast, upper inner quadrant: Secondary | ICD-10-CM | POA: Insufficient documentation

## 2024-01-04 DIAGNOSIS — N6022 Fibroadenosis of left breast: Secondary | ICD-10-CM | POA: Diagnosis not present

## 2024-01-04 DIAGNOSIS — N6021 Fibroadenosis of right breast: Secondary | ICD-10-CM | POA: Diagnosis not present

## 2024-01-04 DIAGNOSIS — C50411 Malignant neoplasm of upper-outer quadrant of right female breast: Secondary | ICD-10-CM | POA: Insufficient documentation

## 2024-01-04 DIAGNOSIS — Z79899 Other long term (current) drug therapy: Secondary | ICD-10-CM | POA: Insufficient documentation

## 2024-01-04 DIAGNOSIS — Z1732 Human epidermal growth factor receptor 2 negative status: Secondary | ICD-10-CM | POA: Diagnosis not present

## 2024-01-04 DIAGNOSIS — R232 Flushing: Secondary | ICD-10-CM | POA: Diagnosis not present

## 2024-01-04 DIAGNOSIS — Z79811 Long term (current) use of aromatase inhibitors: Secondary | ICD-10-CM | POA: Diagnosis not present

## 2024-01-04 DIAGNOSIS — N6011 Diffuse cystic mastopathy of right breast: Secondary | ICD-10-CM | POA: Diagnosis not present

## 2024-01-04 DIAGNOSIS — Z170 Estrogen receptor positive status [ER+]: Secondary | ICD-10-CM | POA: Insufficient documentation

## 2024-01-04 DIAGNOSIS — Z800 Family history of malignant neoplasm of digestive organs: Secondary | ICD-10-CM | POA: Insufficient documentation

## 2024-01-04 DIAGNOSIS — Z1721 Progesterone receptor positive status: Secondary | ICD-10-CM | POA: Insufficient documentation

## 2024-01-04 MED ORDER — ANASTROZOLE 1 MG PO TABS: 1.0000 mg | ORAL_TABLET | Freq: Every day | ORAL | 2 refills | Status: AC

## 2024-01-04 NOTE — Progress Notes (Signed)
 [facility] Cancer Follow up:    Felicia Senior, MD [address] [place] [address]   DIAGNOSIS: Cancer Staging  Malignant neoplasm of upper-outer quadrant of right breast, estrogen receptor positive (HCC) Staging form: Breast, AJCC 8th Edition - Clinical stage from 04/12/2017: Stage IIA (cT3, cN0, cM0, G1, ER+, PR+, HER2-) - Signed by Lanny Callander, MD on 04/16/2017 Neoadjuvant therapy: No Histologic grading system: 3 grade system Laterality: Right - Pathologic: Stage IA (pT2, pN0(sn), cM0, G1, ER+, PR+, HER2-, Oncotype DX score: 12) - Unsigned Neoadjuvant therapy: No Method of lymph node assessment: Sentinel lymph node biopsy Multigene prognostic tests performed: Oncotype DX Recurrence score range: Greater than or equal to 11 Histologic grading system: 3 grade system Laterality: Right Tumor size (mm): 21    SUMMARY OF ONCOLOGIC HISTORY: Oncology History Overview Note  Cancer Staging Malignant neoplasm of upper-outer quadrant of right breast, estrogen receptor positive (HCC) Staging form: Breast, AJCC 8th Edition - Clinical stage from 04/12/2017: Stage IIA (cT3, cN0, cM0, G1, ER: Positive, PR: Positive, HER2: Negative) - Signed by Lanny Callander, MD on 04/16/2017 - Pathologic: Stage IA (pT2, pN0(sn), cM0, G1, ER: Positive, PR: Positive, HER2: Negative, Oncotype DX score: 12) - Unsigned     Malignant neoplasm of upper-outer quadrant of right breast, estrogen receptor positive (HCC)  04/07/2017 Mammogram   IMPRESSION: 1. Highly suspicious mass within the upper right breast, 12 to 12:30 o'clock axis, at posterior depth, with associated architectural distortion and pleomorphic calcifications. The mass measures 1 cm greatest dimension. The mass and associated microcalcifications measure 1.4 cm. No sonographic correlate identified. Recommend stereotactic biopsy using 3D tomosynthesis guidance. 2. Additional punctate and coarse heterogeneous  calcifications within the upper outer quadrant of the right breast, 11-12 o'clock axis region, at middle depth, with a suspicious segmental distribution, spanning approximately 5 cm, the most suspicious calcifications best seen on the CC magnification views. Recommend additional stereotactic biopsy for these right breast calcifications to exclude multifocal/multicentric disease and/or define extent of disease.   04/12/2017 Initial Biopsy   Diagnosis 1. 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 - SEE COMMENT. 2. Breast, right, needle core biopsy, loosely grouped calcs in more central, upper outer quadrant - DUCTAL CARCINOMA IN SITU WITH CALCIFICATIONS. - LOBULAR NEOPLASIA (ATYPICAL LOBULAR HYPERPLASIA). - FIBROCYSTIC CHANGES WITH ADENOSIS AND CALCIFICATIONS.   04/12/2017 Receptors her2   Estrogen Receptor: 95%, POSITIVE, STRONG STAINING INTENSITY Progesterone Receptor: 95%, POSITIVE, STRONG STAINING INTENSITY Proliferation Marker Ki67: 3% HER2: NEGATIVE   04/16/2017 Initial Diagnosis   Malignant neoplasm of upper-outer quadrant of right breast, estrogen receptor positive (HCC)   04/21/2017 Mammogram   04/21/2017 Mammogram IMPRESSION: 1. 2.8 x 2.0 x 0.5 cm biopsy-proven invasive ductal carcinoma and ductal carcinoma in situ in the posterior aspect of the upper-outer quadrant of the right breast. 2. No mass or abnormal enhancement at the location of the recently biopsied low-grade ductal carcinoma in situ with calcifications and atypical lobular hyperplasia in the central aspect of the upper-outer quadrant of the right breast. 3. No evidence of malignancy on the left.   05/11/2017 Surgery   Right breast lumpectomy and SLN biopsy    05/11/2017 Pathology Results   Right lumpectomy showed a 2.1cm invasive ductal carcinoma, margins are negative, (+)ADH, 6  nodes are all negative    05/11/2017 Oncotype testing   RS 12, predicts 9-year distant recurrence 3% with  tamoxifen or AI    05/26/2017 Surgery   Breast reduction by Dr. Arelia    05/26/2017 Pathology Results   Diagnosis 1. Breast, Mammoplasty, left - FIBROCYSTIC CHANGES WITH ADENOSIS AND CALCIFICATIONS. - THERE IS NO EVIDENCE OF MALIGNANCY. 2. Breast, Mammoplasty, right - FIBROCYSTIC CHANGES WITH ADENOSIS AND CALCIFICATIONS. - FAT NECROSIS. - THERE IS NO EVIDENCE OF MALIGNANCY.   07/06/2017 - 08/18/2017 Radiation Therapy   Adjuvant breast radiation with Dr. Dewey   08/2017 -  Anti-estrogen oral therapy   Anastrozole  1mg  daily   04/02/2018 Mammogram   Mammogram 04/02/18 There are calcifications near the lateral right lumpectomy site best seen on the CC view. There are also calcifications located behind the nipple on the CC view, likely superiorly located on the 90 degree view near the site of the second lumpectomy. There are also new calcifications located in the posterior slightly medial left breast, best seen on the CC view.   Mammographic images were processed with CAD.   04/05/2018 Pathology Results   Diagnosis 04/05/18 1. Breast, left, needle core biopsy, upper inner - FIBROCYSTIC CHANGES AND ADENOSIS WITH CALCIFICATIONS - NO MALIGNANCY IDENTIFIED 2. Breast, right, needle core biopsy, upper central - FIBROCYSTIC CHANGES, ADENOSIS AND FAT NECROSIS WITH CALCIFICATIONS - NO MALIGNANCY IDENTIFIED 3. Breast, right, needle core biopsy, upper outer - FIBROCYSTIC CHANGES, ADENOSIS AND FAT NECROSIS WITH CALCIFICATIONS - NO MALIGNANCY IDENTIFIED     CURRENT THERAPY: anastrozole   INTERVAL HISTORY:  Discussed the use of AI scribe software for clinical note transcription with the patient, who gave verbal consent to proceed.  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  Diagnosed with stage 2A invasive ductal carcinoma, ER, PR positive, HER2 negative, KI67 3% in October 2018. Underwent right lumpectomy, sentinel lymph node biopsy, breast reduction, adjuvant radiation, and started anastrozole  in March 2019.  Currently on anastrozole , experiencing hot flashes and sweating, managed with Effexor . Concerned about recurrence of breast cancer. Recent mammogram on December 01, 2023, showed no evidence of malignancy, breast density category C. Last oncology visit was a year ago.  Experiencing sharp pain near the lymph node removal site, described as 'sharp pain' and 'water runs down my back'. Awaiting bone density testing, aware of bone density concerns with anastrozole  use.     Patient Active Problem List   Diagnosis Date Noted   Malignant neoplasm of upper-outer quadrant of right breast, estrogen receptor positive (HCC) 04/16/2017   Cervical myelopathy (HCC) 05/24/2013   Paresthesias 03/22/2013    has no known allergies.  MEDICAL HISTORY: Past Medical History:  Diagnosis Date   Arthritis    knee    Breast cancer (HCC)    Breast cancer, right (HCC) 04/2017   Complication of anesthesia    hard to wake up post-op; itching after anesthesia   GERD (gastroesophageal reflux disease)    History of vertigo    Hypertension    states under control with med., has been on med. > 10 yr.   Mitral valve prolapse    Panic attacks    Personal history of radiation therapy    Wears partial dentures    upper    SURGICAL HISTORY: Past Surgical History:  Procedure Laterality Date   ANTERIOR CERVICAL DECOMP/DISCECTOMY FUSION N/A 05/24/2013   Procedure: CERVICAL FOUR-FIVE, CERVICAL FIVE-SIX, CERVICAL SIX-SEVEN ANTERIOR CERVICAL DECOMPRESSION/DISCECTOMY FUSION 3 LEVELS;  Surgeon: Darina MALVA Boehringer, MD;  Location: [facility];  Service: Neurosurgery;  Laterality: N/A;   ANTERIOR CERVICAL DECOMP/DISCECTOMY FUSION  10/27/2016   C3-4   BREAST BIOPSY Bilateral  03/2018   benign   BREAST LUMPECTOMY Right 2018   x 2   BREAST REDUCTION SURGERY Bilateral 05/26/2017   Procedure: LEFT BREAST MAMMARY REDUCTION  (BREAST) AND RIGHT ONCOPLASTIC RECONSTRUCTION;  Surgeon: Arelia Filippo, MD;  Location: [facility];  Service: Plastics;  Laterality: Bilateral;   CESAREAN SECTION     x 2   DILATION AND CURETTAGE OF UTERUS  05/30/2008   ENDOMETRIAL ABLATION  05/30/2008   RADIOACTIVE SEED GUIDED PARTIAL MASTECTOMY WITH AXILLARY SENTINEL LYMPH NODE BIOPSY Right 05/11/2017   Procedure: RIGHT BRACKETED RADIOACTIVE SEED GUIDED PARTIAL MASTECTOMY WITH  SENTINEL LYMPH NODE BIOPSY WITH ERAS PATHWAY;  Surgeon: Aron Shoulders, MD;  Location:  SURGERY CENTER;  Service: General;  Laterality: Right;  PEC BLOCK   REDUCTION MAMMAPLASTY Bilateral    TUBAL LIGATION      SOCIAL HISTORY: Social History   Socioeconomic History   Marital status: Married    Spouse name: Not on file   Number of children: 2   Years of education: Not on file   Highest education level: Not on file  Occupational History   Not on file  Tobacco Use   Smoking status: Never   Smokeless tobacco: Never  Vaping Use   Vaping status: Never Used  Substance and Sexual Activity   Alcohol use: No   Drug use: No   Sexual activity: Not on file  Other Topics Concern   Not on file  Social History Narrative   Not on file   Social Drivers of Health   Financial Resource Strain: Not on file  Food Insecurity: Not on file  Transportation Needs: Not on file  Physical Activity: Not on file  Stress: Not on file  Social Connections: Not on file  Intimate Partner Violence: Not on file    FAMILY HISTORY: Family History  Problem Relation Age of Onset   Colon cancer Mother 25   Breast cancer Neg Hx    BRCA 1/2 Neg Hx     Review of Systems  Constitutional:  Negative for appetite change, chills, fatigue, fever and unexpected weight change.  HENT:   Negative for hearing loss,  lump/mass and trouble swallowing.   Eyes:  Negative for eye problems and icterus.  Respiratory:  Negative for chest tightness, cough and shortness of breath.   Cardiovascular:  Negative for chest pain, leg swelling and palpitations.  Gastrointestinal:  Negative for abdominal distention, abdominal pain, constipation, diarrhea, nausea and vomiting.  Endocrine: Positive for hot flashes.  Genitourinary:  Negative for difficulty urinating.   Musculoskeletal:  Negative for arthralgias.  Skin:  Negative for itching and rash.  Neurological:  Negative for dizziness, extremity weakness, headaches and numbness.  Hematological:  Negative for adenopathy. Does not bruise/bleed easily.  Psychiatric/Behavioral:  Negative for depression. The patient is not nervous/anxious.       PHYSICAL EXAMINATION   Onc Performance Status - 01/04/24 1103       ECOG Perf Status   ECOG Perf Status Fully active, able to carry on all pre-disease performance without restriction      KPS SCALE   KPS % SCORE Normal, no compliants, no evidence of disease          Vitals:   01/04/24 1051  BP: 132/68  Pulse: 71  Resp: 16  Temp: 97.9 F (36.6 C)  SpO2: 100%    Physical  Exam Constitutional:      General: She is not in acute distress.    Appearance: Normal appearance. She is not toxic-appearing.  HENT:     Head: Normocephalic and atraumatic.     Mouth/Throat:     Mouth: Mucous membranes are moist.     Pharynx: Oropharynx is clear. No oropharyngeal exudate or posterior oropharyngeal erythema.  Eyes:     General: No scleral icterus. Cardiovascular:     Rate and Rhythm: Normal rate and regular rhythm.     Pulses: Normal pulses.     Heart sounds: Normal heart sounds.  Pulmonary:     Effort: Pulmonary effort is normal.     Breath sounds: Normal breath sounds.  Chest:     Comments: Right breast s/p lumpectomy and radiation, no sign of local recurrence.   Abdominal:     General: Abdomen is flat. Bowel  sounds are normal. There is no distension.     Palpations: Abdomen is soft.     Tenderness: There is no abdominal tenderness.  Musculoskeletal:        General: No swelling.     Cervical back: Neck supple.  Lymphadenopathy:     Cervical: No cervical adenopathy.     Upper Body:     Right upper body: No supraclavicular or axillary adenopathy.     Left upper body: No supraclavicular or axillary adenopathy.  Skin:    General: Skin is warm and dry.     Findings: No rash.  Neurological:     General: No focal deficit present.     Mental Status: She is alert.  Psychiatric:        Mood and Affect: Mood normal.        Behavior: Behavior normal.     LABORATORY DATA:  CBC    Component Value Date/Time   WBC 5.8 01/06/2023 1303   WBC 11.5 (H) 05/27/2017 0653   RBC 4.13 01/06/2023 1303   HGB 11.4 (L) 01/06/2023 1303   HGB 13.1 04/19/2017 0848   HCT 35.5 (L) 01/06/2023 1303   HCT 41.1 04/19/2017 0848   PLT 211 01/06/2023 1303   PLT 211 04/19/2017 0848   MCV 86.0 01/06/2023 1303   MCV 85.3 04/19/2017 0848   MCH 27.6 01/06/2023 1303   MCHC 32.1 01/06/2023 1303   RDW 13.2 01/06/2023 1303   RDW 14.4 04/19/2017 0848   LYMPHSABS 2.2 01/06/2023 1303   LYMPHSABS 2.4 04/19/2017 0848   MONOABS 0.6 01/06/2023 1303   MONOABS 0.6 04/19/2017 0848   EOSABS 0.2 01/06/2023 1303   EOSABS 0.2 04/19/2017 0848   BASOSABS 0.0 01/06/2023 1303   BASOSABS 0.0 04/19/2017 0848    CMP     Component Value Date/Time   NA 139 01/06/2023 1303   NA 142 04/19/2017 0848   K 3.8 01/06/2023 1303   K 3.7 04/19/2017 0848   CL 104 01/06/2023 1303   CO2 30 01/06/2023 1303   CO2 28 04/19/2017 0848   GLUCOSE 80 01/06/2023 1303   GLUCOSE 95 04/19/2017 0848   BUN 15 01/06/2023 1303   BUN 17.2 04/19/2017 0848   CREATININE 0.63 01/06/2023 1303   CREATININE 0.8 04/19/2017 0848   CALCIUM 9.5 01/06/2023 1303   CALCIUM 9.8 04/19/2017 0848   PROT 6.6 01/06/2023 1303   PROT 7.6 04/19/2017 0848   ALBUMIN 4.2  01/06/2023 1303   ALBUMIN 4.2 04/19/2017 0848   AST 15 01/06/2023 1303   AST 17 04/19/2017 0848   ALT 12 01/06/2023 1303  ALT 12 04/19/2017 0848   ALKPHOS 58 01/06/2023 1303   ALKPHOS 58 04/19/2017 0848   BILITOT 0.4 01/06/2023 1303   BILITOT 0.47 04/19/2017 0848   GFRNONAA >60 01/06/2023 1303   GFRAA >60 10/11/2019 1238     ASSESSMENT and THERAPY PLAN:   Assessment and Plan Assessment & Plan Stage 2A invasive ductal carcinoma, ER, PR positive, HER2 negative Diagnosed in October 2018. Underwent right lumpectomy, sentinel lymph node biopsy, breast reduction, adjuvant radiation, and antiestrogen therapy with anastrozole . Mammogram on December 01, 2023, showed no evidence of malignancy and breast density category C.  - Continue anastrozole  for nine more months. - Order bone density test. - Schedule follow-up in July 2026.  Adverse effects of anastrozole  Experiencing hot flashes and sweating, common side effects of anastrozole . Discussed balance between benefits of extended anastrozole  therapy and side effects, including bone health and menopausal symptoms. - Continue Effexor  for management of hot flashes. - Monitor for worsening symptoms and report if she occurs.      All questions were answered. The patient knows to call the clinic with any problems, questions or concerns. We can certainly see the patient much sooner if necessary.  Total encounter time:30 minutes*in face-to-face visit time, chart review, lab review, care coordination, order entry, and documentation of the encounter time.    Morna Kendall, NP 01/04/24 11:13 AM Medical Oncology and Hematology [facility] [address] [place], [place] [address] Tel. [phone]    Fax. [phone]  *Total Encounter Time as defined by the Centers for Medicare and Medicaid Services includes, in addition to the face-to-face time of a patient visit (documented in the note above) non-face-to-face time: obtaining  and reviewing outside history, ordering and reviewing medications, tests or procedures, care coordination (communications with other health care professionals or caregivers) and documentation in the medical record.

## 2024-01-20 ENCOUNTER — Other Ambulatory Visit: Payer: Self-pay | Admitting: Hematology

## 2024-02-13 ENCOUNTER — Other Ambulatory Visit: Payer: Self-pay | Admitting: Adult Health

## 2024-02-13 DIAGNOSIS — Z79811 Long term (current) use of aromatase inhibitors: Secondary | ICD-10-CM

## 2024-02-13 DIAGNOSIS — E2839 Other primary ovarian failure: Secondary | ICD-10-CM

## 2024-02-14 ENCOUNTER — Telehealth: Payer: Self-pay | Admitting: *Deleted

## 2024-02-14 NOTE — Telephone Encounter (Signed)
-----   Message from Morna JAYSON Kendall sent at 02/13/2024  6:18 PM EDT ----- Please let patient know that bone density testing is no longer being performed at [facility].  I have changed her testing to occur at [facility] and they are slightly backed up with testing right now, so they will likely get her in December or January for testing.  Her most recent bone density testing was normal (and great!), so I am not concerned about a delay.    Let me know if she has any questions.    Thanks, LC

## 2024-02-14 NOTE — Telephone Encounter (Signed)
Per Lindsey Causey, NP, called pt with message below. Pt verbalized understanding.  

## 2025-01-03 ENCOUNTER — Inpatient Hospital Stay: Admitting: Adult Health
